# Patient Record
Sex: Male | Born: 1970
Health system: Southern US, Community
[De-identification: ages and names within clinical notes are randomized; demographics above are authoritative.]

## PROBLEM LIST (undated history)

## (undated) DIAGNOSIS — I10 Essential (primary) hypertension: Secondary | ICD-10-CM

## (undated) DIAGNOSIS — T7840XA Allergy, unspecified, initial encounter: Secondary | ICD-10-CM

## (undated) HISTORY — DX: Essential (primary) hypertension: I10

## (undated) HISTORY — DX: Allergy, unspecified, initial encounter: T78.40XA

## (undated) HISTORY — PX: HAND SURGERY: SHX662

---

## 2007-12-29 ENCOUNTER — Emergency Department (HOSPITAL_COMMUNITY): Admission: EM | Admit: 2007-12-29 | Discharge: 2007-12-29 | Payer: Self-pay | Admitting: Emergency Medicine

## 2011-09-13 DIAGNOSIS — Z0271 Encounter for disability determination: Secondary | ICD-10-CM

## 2012-01-25 ENCOUNTER — Ambulatory Visit (INDEPENDENT_AMBULATORY_CARE_PROVIDER_SITE_OTHER): Payer: BC Managed Care – PPO | Admitting: Family Medicine

## 2012-01-25 VITALS — BP 164/118 | HR 87 | Temp 98.2°F | Resp 18 | Wt 260.0 lb

## 2012-01-25 DIAGNOSIS — R5381 Other malaise: Secondary | ICD-10-CM

## 2012-01-25 DIAGNOSIS — I10 Essential (primary) hypertension: Secondary | ICD-10-CM

## 2012-01-25 DIAGNOSIS — R5383 Other fatigue: Secondary | ICD-10-CM

## 2012-01-25 DIAGNOSIS — E669 Obesity, unspecified: Secondary | ICD-10-CM

## 2012-01-25 LAB — COMPREHENSIVE METABOLIC PANEL
ALT: 47 U/L (ref 0–53)
AST: 28 U/L (ref 0–37)
Albumin: 5 g/dL (ref 3.5–5.2)
Alkaline Phosphatase: 65 U/L (ref 39–117)
BUN: 12 mg/dL (ref 6–23)
CO2: 25 mEq/L (ref 19–32)
Calcium: 10.1 mg/dL (ref 8.4–10.5)
Chloride: 104 mEq/L (ref 96–112)
Creat: 1.05 mg/dL (ref 0.50–1.35)
Glucose, Bld: 91 mg/dL (ref 70–99)
Potassium: 4.7 mEq/L (ref 3.5–5.3)
Sodium: 140 mEq/L (ref 135–145)
Total Bilirubin: 0.5 mg/dL (ref 0.3–1.2)
Total Protein: 7.7 g/dL (ref 6.0–8.3)

## 2012-01-25 LAB — POCT CBC
Granulocyte percent: 74.1 %G (ref 37–80)
HCT, POC: 52 % (ref 43.5–53.7)
Hemoglobin: 16.2 g/dL (ref 14.1–18.1)
Lymph, poc: 1.3 (ref 0.6–3.4)
MCH, POC: 29.3 pg (ref 27–31.2)
MCHC: 31.2 g/dL — AB (ref 31.8–35.4)
MCV: 94 fL (ref 80–97)
MID (cbc): 0.4 (ref 0–0.9)
MPV: 10.7 fL (ref 0–99.8)
POC Granulocyte: 4.8 (ref 2–6.9)
POC LYMPH PERCENT: 19.5 %L (ref 10–50)
POC MID %: 6.4 %M (ref 0–12)
Platelet Count, POC: 301 10*3/uL (ref 142–424)
RBC: 5.53 M/uL (ref 4.69–6.13)
RDW, POC: 13 %
WBC: 6.5 10*3/uL (ref 4.6–10.2)

## 2012-01-25 LAB — LIPID PANEL
Cholesterol: 204 mg/dL — ABNORMAL HIGH (ref 0–200)
HDL: 36 mg/dL — ABNORMAL LOW (ref >39)
LDL Cholesterol: 142 mg/dL — ABNORMAL HIGH (ref 0–99)
Total CHOL/HDL Ratio: 5.7 Ratio
Triglycerides: 129 mg/dL (ref <150)
VLDL: 26 mg/dL (ref 0–40)

## 2012-01-25 LAB — TSH: TSH: 0.547 u[IU]/mL (ref 0.350–4.500)

## 2012-01-25 LAB — GLUCOSE, POCT (MANUAL RESULT ENTRY): POC Glucose: 81 mg/dl (ref 70–99)

## 2012-01-25 MED ORDER — VERAPAMIL HCL ER 240 MG PO TBCR: 240.0000 mg | EXTENDED_RELEASE_TABLET | Freq: Every day | ORAL | Status: DC

## 2012-01-25 MED ORDER — TRAZODONE HCL 50 MG PO TABS: 25.0000 mg | ORAL_TABLET | Freq: Every evening | ORAL | Status: DC | PRN

## 2012-01-25 NOTE — Progress Notes (Signed)
Subjective:    Patient ID: Philip Conley, male    DOB: Dec 27, 1970, 41 y.o.   MRN: [id number]  HPI  Had wellness check at work and BP was about as high as it was today.   Noticing he is very fatigued - will fall asleep anytime during the day if he is just sitting. Works 3rd shift - but does not sleep well during the day.  Falls asleep but up again in 3-4hrs, no matter how long he lays, cannot go back to sleep. Going on for the past 6 mos but has been working nights for 4 yrs - doesn't know what has changed.  Sleep is better on the wkends when he can sleep at night but still does not feel well rested when he awakes in the a.m. and will often awaken spontaneously around 5 or 6 hrs if sleeping at night. Comes out like someone slapped him. Allergies worse, snores but has been better recently - not sure why, no reported apnea episodes but wakes fatigued.  Has been tyring to avoid salt and caffeine.  Drinking 1 1/2 to 2 gallons H20/day - always has. No polyuria, slight increased urinary freq, decreased meal freq - very sporadic.  Weight fluctuates.  Last ate around 5 pm but didn't have to work last night - so slept from 11:30 - 5 last night. Is fasting.   Was put on lisinopril 10 sev yrs ago from our clinic when BP was in 130s - doesn't remember what med but had more migraines and so it was decreased to 5 mg but then was stopped as bp was not lowered at all by this low dose.  H/o severe migraines which were made worse by the BP meds.  Has had migraines since a child and has had extensive neurologic w/u done as they will cause severe vision changes, facial asymetry, weakness.  Has not used or ever tried any sleeping meds - in fact, no meds at all other than occ reg ibuprofen/tylenol for MSK pain.  No past medical history on file.   Review of Systems  Constitutional: Negative for fever, activity change, appetite change and unexpected weight change.  Eyes: Negative for visual disturbance.  Respiratory:  Negative for apnea, chest tightness and shortness of breath.   Cardiovascular: Positive for leg swelling. Negative for chest pain.  Genitourinary: Positive for frequency. Negative for urgency and enuresis.  Neurological: Positive for headaches. Negative for dizziness, syncope, facial asymmetry, weakness and light-headedness.  Psychiatric/Behavioral: Positive for sleep disturbance. Negative for dysphoric mood and decreased concentration. The patient is not nervous/anxious.       BP 164/118  Pulse 87  Temp 98.2 F (36.8 C) (Oral)  Resp 18  Wt 260 lb (117.935 kg) Objective:   Physical Exam  Constitutional: He is oriented to person, place, and time. He appears well-developed and well-nourished. No distress.  HENT:  Head: Normocephalic and atraumatic.  Eyes: Conjunctivae normal are normal. Pupils are equal, round, and reactive to light. No scleral icterus.  Neck: Normal range of motion. Neck supple. No thyromegaly present.  Cardiovascular: Normal rate, regular rhythm, normal heart sounds and intact distal pulses.   Pulmonary/Chest: Effort normal and breath sounds normal. No respiratory distress.  Musculoskeletal: He exhibits no edema.  Lymphadenopathy:    He has no cervical adenopathy.  Neurological: He is alert and oriented to person, place, and time.  Skin: Skin is warm and dry. He is not diaphoretic.  Psychiatric: He has a normal mood and affect. His  behavior is normal.      Results for orders placed in visit on 01/25/12  POCT CBC      Component Value Range   WBC 6.5  4.6 - 10.2 K/uL   Lymph, poc 1.3  0.6 - 3.4   POC LYMPH PERCENT 19.5  10 - 50 %L   MID (cbc) 0.4  0 - 0.9   POC MID % 6.4  0 - 12 %M   POC Granulocyte 4.8  2 - 6.9   Granulocyte percent 74.1  37 - 80 %G   RBC 5.53  4.69 - 6.13 M/uL   Hemoglobin 16.2  14.1 - 18.1 g/dL   HCT, POC 16.1  09.6 - 53.7 %   MCV 94.0  80 - 97 fL   MCH, POC 29.3  27 - 31.2 pg   MCHC 31.2 (*) 31.8 - 35.4 g/dL   RDW, POC 04.5      Platelet Count, POC 301  142 - 424 K/uL   MPV 10.7  0 - 99.8 fL  GLUCOSE, POCT (MANUAL RESULT ENTRY)      Component Value Range   POC Glucose 81  70 - 99 mg/dl  TSH      Component Value Range   TSH 0.547  0.350 - 4.500 uIU/mL  COMPREHENSIVE METABOLIC PANEL      Component Value Range   Sodium 140  135 - 145 mEq/L   Potassium 4.7  3.5 - 5.3 mEq/L   Chloride 104  96 - 112 mEq/L   CO2 25  19 - 32 mEq/L   Glucose, Bld 91  70 - 99 mg/dL   BUN 12  6 - 23 mg/dL   Creat 4.09  8.11 - 9.14 mg/dL   Total Bilirubin 0.5  0.3 - 1.2 mg/dL   Alkaline Phosphatase 65  39 - 117 U/L   AST 28  0 - 37 U/L   ALT 47  0 - 53 U/L   Total Protein 7.7  6.0 - 8.3 g/dL   Albumin 5.0  3.5 - 5.2 g/dL   Calcium 78.2  8.4 - 95.6 mg/dL  LIPID PANEL      Component Value Range   Cholesterol 204 (*) 0 - 200 mg/dL   Triglycerides 213  <086 mg/dL   HDL 36 (*) >57 mg/dL   Total CHOL/HDL Ratio 5.7     VLDL 26  0 - 40 mg/dL   LDL Cholesterol 846 (*) 0 - 99 mg/dL      Epworth sleepiness scale of 5. Assessment & Plan:   1. Hypertension - new diagnosis. Discussed treatment options - as pt has a h/o migraines, will start bp treatment w/ verapamil in hopes that it will decrease migraines further and not exac as the lisinopril 10 did. POCT CBC, POCT glucose (manual entry), TSH, Comprehensive metabolic panel, Lipid panel  2. Obesity  POCT CBC, POCT glucose (manual entry), TSH, Comprehensive metabolic panel, Lipid panel  3. Fatigue - sig concern for sleep apnea though Epworth scale is low and certainly 3rd shift is adequate cause for fatigue. Will try trx w/ qhs prn trazodone but if daytime fatigue cont, cons sleep study POCT CBC, POCT glucose (manual entry), TSH, Comprehensive metabolic panel, Lipid panel

## 2012-02-21 ENCOUNTER — Other Ambulatory Visit: Payer: Self-pay | Admitting: Family Medicine

## 2012-02-25 ENCOUNTER — Ambulatory Visit (INDEPENDENT_AMBULATORY_CARE_PROVIDER_SITE_OTHER): Payer: BC Managed Care – PPO | Admitting: Family Medicine

## 2012-02-25 VITALS — BP 144/80 | HR 75 | Temp 98.1°F | Resp 16 | Ht 65.0 in | Wt 259.2 lb

## 2012-02-25 DIAGNOSIS — I10 Essential (primary) hypertension: Secondary | ICD-10-CM | POA: Insufficient documentation

## 2012-02-25 DIAGNOSIS — G4700 Insomnia, unspecified: Secondary | ICD-10-CM | POA: Insufficient documentation

## 2012-02-25 MED ORDER — ZOLPIDEM TARTRATE 10 MG PO TABS: 5.0000 mg | ORAL_TABLET | Freq: Every evening | ORAL | Status: DC | PRN

## 2012-02-25 MED ORDER — VERAPAMIL HCL ER 360 MG PO CP24: 360.0000 mg | ORAL_CAPSULE | Freq: Every day | ORAL | Status: DC

## 2012-03-31 ENCOUNTER — Ambulatory Visit (INDEPENDENT_AMBULATORY_CARE_PROVIDER_SITE_OTHER): Payer: BC Managed Care – PPO | Admitting: Family Medicine

## 2012-03-31 ENCOUNTER — Encounter: Payer: Self-pay | Admitting: Family Medicine

## 2012-03-31 VITALS — BP 138/88 | HR 65 | Temp 98.5°F | Resp 16 | Ht 65.0 in | Wt 261.5 lb

## 2012-03-31 DIAGNOSIS — Z79899 Other long term (current) drug therapy: Secondary | ICD-10-CM

## 2012-03-31 DIAGNOSIS — E785 Hyperlipidemia, unspecified: Secondary | ICD-10-CM | POA: Insufficient documentation

## 2012-03-31 DIAGNOSIS — I10 Essential (primary) hypertension: Secondary | ICD-10-CM

## 2012-03-31 DIAGNOSIS — Z5181 Encounter for therapeutic drug level monitoring: Secondary | ICD-10-CM | POA: Insufficient documentation

## 2012-03-31 DIAGNOSIS — G4700 Insomnia, unspecified: Secondary | ICD-10-CM

## 2012-03-31 DIAGNOSIS — G43909 Migraine, unspecified, not intractable, without status migrainosus: Secondary | ICD-10-CM | POA: Insufficient documentation

## 2012-03-31 MED ORDER — VERAPAMIL HCL ER 240 MG PO CP24: 480.0000 mg | ORAL_CAPSULE | Freq: Every morning | ORAL | Status: DC

## 2012-03-31 NOTE — Progress Notes (Signed)
Subjective:    Patient ID: Philip Conley, male    DOB: [birth year], 42 y.o.   MRN: [id number]  HPI  When he was taking 2 tabs of the verapamil 240mg  qam for 2 wks he did absolutely great, no complaints.  However, he switched to the 1 tab of 360mg  qam 2 wks ago and since then he has been having worse HAs, bloody noses - even with sneezing, both sides bleed at the same time, severe swelling in legs.  It is helping him sleep but now sleeping to much - even when not on ambien, and it seems to make him really hungry.  Checked bp the other day and it was 135/85. Only took [place] twice. Seemed to have a counter effect as was up every 2 hrs - peeing a lot and dizziness after the medication. Taking bp meds in the morning after work before he goes to bed  Has tried a lot of 2 tylenol ES, uses excedrin migraine occ but very sinsitive to the caffiene component.  Has an extensive h/o migraine w/u and treatment and sig side effects to all rx migraine meds.  Past Medical History  Diagnosis Date  . Allergy    Current Outpatient Prescriptions on File Prior to Visit  Medication Sig Dispense Refill  . Aspirin-Acetaminophen-Caffeine (EXCEDRIN MIGRAINE PO) Take by mouth as needed.      . zolpidem (AMBIEN) 10 MG tablet Take 0.5 tablets (5 mg total) by mouth at bedtime as needed for sleep.  15 tablet  0   No Known Allergies  Review of Systems  Constitutional: Positive for appetite change and unexpected weight change. Negative for fever and chills.  HENT: Positive for nosebleeds and sneezing.   Eyes: Negative for visual disturbance.  Respiratory: Negative for shortness of breath.   Cardiovascular: Positive for leg swelling. Negative for chest pain.  Genitourinary: Positive for frequency.  Neurological: Positive for dizziness, light-headedness and headaches. Negative for syncope, facial asymmetry and weakness.  Psychiatric/Behavioral: Positive for sleep disturbance. Negative for dysphoric mood and decreased  concentration. The patient is not nervous/anxious.       BP 138/88  Pulse 65  Temp 98.5 F (36.9 C) (Oral)  Resp 16  Ht 5\' 5"  (1.651 m)  Wt 261 lb 8 oz (118.616 kg)  BMI 43.52 kg/m2  SpO2 96% Objective:   Physical Exam  Constitutional: He is oriented to person, place, and time. He appears well-developed and well-nourished. No distress.  HENT:  Head: Normocephalic and atraumatic.  Eyes: Conjunctivae normal are normal. Pupils are equal, round, and reactive to light. No scleral icterus.  Neck: Normal range of motion. Neck supple. No thyromegaly present.  Cardiovascular: Normal rate, regular rhythm, normal heart sounds and intact distal pulses.   Pulmonary/Chest: Effort normal and breath sounds normal. No respiratory distress.  Musculoskeletal: He exhibits no edema.  Lymphadenopathy:    He has no cervical adenopathy.  Neurological: He is alert and oriented to person, place, and time. No cranial nerve deficit. He exhibits normal muscle tone. Coordination normal.  Skin: Skin is warm and dry. He is not diaphoretic.  Psychiatric: He has a normal mood and affect. His behavior is normal.          Assessment & Plan:   1. Hypertension - very odd that he could not tolerate the 360 but did fine on 2 of the 240 (=480).  Could be that it was just still building up in his system vs some other filler substance in the pill. . . ?  He will switch back to the 2 of the 240 and if still having side effects we will decrease back to 1 tab of 240 - which we know he did well on, and add in another med (though has had some adverse effects w/ some prior). verapamil (VERELAN PM) 240 MG 24 hr capsule 2 tabs po qd.  Pt to check BP and call in 2 wks w/ reports and whether side effects have resolved or not.  2. Insomnia  Seems to be resolving, both trazodone and ambien hand the counter-effects so no other meds for now unless sxs worsen.  3. Hyperlipidemia LDL goal < 130  Cont lifestyle change, recheck in 3-4 mos  at next f/u  4. Migraine headache  Cont otc nsaids since h/o adverse effects to rx abortive treatments.  5. Encounter for long-term (current) use of other medications

## 2012-03-31 NOTE — Patient Instructions (Addendum)
Fat and Cholesterol Control Diet Cholesterol levels in your body are determined significantly by your diet. Cholesterol levels may also be related to heart disease. The following material helps to explain this relationship and discusses what you can do to help keep your heart healthy. Not all cholesterol is bad. Low-density lipoprotein (LDL) cholesterol is the "bad" cholesterol. It may cause fatty deposits to build up inside your arteries. High-density lipoprotein (HDL) cholesterol is "good." It helps to remove the "bad" LDL cholesterol from your blood. Cholesterol is a very important risk factor for heart disease. Other risk factors are high blood pressure, smoking, stress, heredity, and weight. The heart muscle gets its supply of blood through the coronary arteries. If your LDL cholesterol is high and your HDL cholesterol is low, you are at risk for having fatty deposits build up in your coronary arteries. This leaves less room through which blood can flow. Without sufficient blood and oxygen, the heart muscle cannot function properly and you may feel chest pains (angina pectoris). When a coronary artery closes up entirely, a part of the heart muscle may die causing a heart attack (myocardial infarction). CHECKING CHOLESTEROL When your caregiver sends your blood to a lab to be examined for cholesterol, a complete lipid (fat) profile may be done. With this test, the total amount of cholesterol and levels of LDL and HDL are determined. Triglycerides are a type of fat that circulates in the blood. They can also be used to determine heart disease risk. The list below describes what the numbers should be: Test: Total Cholesterol.  Less than 200 mg/dl. Test: LDL "bad cholesterol."  Less than 100 mg/dl.  Less than 70 mg/dl if you are at very high risk of a heart attack or sudden cardiac death. Test: HDL "good cholesterol."  Greater than 50 mg/dl for women.  Greater than 40 mg/dl for men. Test:  Triglycerides.  Less than 150 mg/dl. CONTROLLING CHOLESTEROL WITH DIET Although exercise and lifestyle factors are important, your diet is key. That is because certain foods are known to raise cholesterol and others to lower it. The goal is to balance foods for their effect on cholesterol and more importantly, to replace saturated and trans fat with other types of fat, such as monounsaturated fat, polyunsaturated fat, and omega-3 fatty acids. On average, a person should consume no more than 15 to 17 g of saturated fat daily. Saturated and trans fats are considered "bad" fats, and they will raise LDL cholesterol. Saturated fats are primarily found in animal products such as meats, butter, and cream. However, that does not mean you need to give up all your favorite foods. Today, there are good tasting, low-fat, low-cholesterol substitutes for most of the things you like to eat. Choose low-fat or nonfat alternatives. Choose round or loin cuts of red meat. These types of cuts are lowest in fat and cholesterol. Chicken (without the skin), fish, veal, and ground [place] breast are great choices. Eliminate fatty meats, such as hot dogs and salami. Even shellfish have little or no saturated fat. Have a 3 oz (85 g) portion when you eat lean meat, poultry, or fish. Trans fats are also called "partially hydrogenated oils." They are oils that have been scientifically manipulated so that they are solid at room temperature resulting in a longer shelf life and improved taste and texture of foods in which they are added. Trans fats are found in stick margarine, some tub margarines, cookies, crackers, and baked goods.  When baking and cooking, oils  are a great substitute for butter. The monounsaturated oils are especially beneficial since it is believed they lower LDL and raise HDL. The oils you should avoid entirely are saturated tropical oils, such as coconut and palm.  Remember to eat a lot from food groups that are  naturally free of saturated and trans fat, including fish, fruit, vegetables, beans, grains (barley, rice, couscous, bulgur wheat), and pasta (without cream sauces).  IDENTIFYING FOODS THAT LOWER CHOLESTEROL  Soluble fiber may lower your cholesterol. This type of fiber is found in fruits such as apples, vegetables such as broccoli, potatoes, and carrots, legumes such as beans, peas, and lentils, and grains such as barley. Foods fortified with plant sterols (phytosterol) may also lower cholesterol. You should eat at least 2 g per day of these foods for a cholesterol lowering effect.  Read package labels to identify low-saturated fats, trans fat free, and low-fat foods at the supermarket. Select cheeses that have only 2 to 3 g saturated fat per ounce. Use a heart-healthy tub margarine that is free of trans fats or partially hydrogenated oil. When buying baked goods (cookies, crackers), avoid partially hydrogenated oils. Breads and muffins should be made from whole grains (whole-wheat or whole oat flour, instead of "flour" or "enriched flour"). Buy non-creamy canned soups with reduced salt and no added fats.  FOOD PREPARATION TECHNIQUES  Never deep-fry. If you must fry, either stir-fry, which uses very little fat, or use non-stick cooking sprays. When possible, broil, bake, or roast meats, and steam vegetables. Instead of putting butter or margarine on vegetables, use lemon and herbs, applesauce, and cinnamon (for squash and sweet potatoes), nonfat yogurt, salsa, and low-fat dressings for salads.  LOW-SATURATED FAT / LOW-FAT FOOD SUBSTITUTES Meats / Saturated Fat (g)  Avoid: Steak, marbled (3 oz/85 g) / 11 g  Choose: Steak, lean (3 oz/85 g) / 4 g  Avoid: Hamburger (3 oz/85 g) / 7 g  Choose: Hamburger, lean (3 oz/85 g) / 5 g  Avoid: Ham (3 oz/85 g) / 6 g  Choose: Ham, lean cut (3 oz/85 g) / 2.4 g  Avoid: Chicken, with skin, dark meat (3 oz/85 g) / 4 g  Choose: Chicken, skin removed, dark meat (3  oz/85 g) / 2 g  Avoid: Chicken, with skin, light meat (3 oz/85 g) / 2.5 g  Choose: Chicken, skin removed, light meat (3 oz/85 g) / 1 g Dairy / Saturated Fat (g)  Avoid: Whole milk (1 cup) / 5 g  Choose: Low-fat milk, 2% (1 cup) / 3 g  Choose: Low-fat milk, 1% (1 cup) / 1.5 g  Choose: Skim milk (1 cup) / 0.3 g  Avoid: Hard cheese (1 oz/28 g) / 6 g  Choose: Skim milk cheese (1 oz/28 g) / 2 to 3 g  Avoid: Cottage cheese, 4% fat (1 cup) / 6.5 g  Choose: Low-fat cottage cheese, 1% fat (1 cup) / 1.5 g  Avoid: Ice cream (1 cup) / 9 g  Choose: Sherbet (1 cup) / 2.5 g  Choose: Nonfat frozen yogurt (1 cup) / 0.3 g  Choose: Frozen fruit bar / trace  Avoid: Whipped cream (1 tbs) / 3.5 g  Choose: Nondairy whipped topping (1 tbs) / 1 g Condiments / Saturated Fat (g)  Avoid: Mayonnaise (1 tbs) / 2 g  Choose: Low-fat mayonnaise (1 tbs) / 1 g  Avoid: Butter (1 tbs) / 7 g  Choose: Extra light margarine (1 tbs) / 1 g  Avoid: Coconut oil (1   tbs) / 11.8 g  Choose: Olive oil (1 tbs) / 1.8 g  Choose: Corn oil (1 tbs) / 1.7 g  Choose: Safflower oil (1 tbs) / 1.2 g  Choose: Sunflower oil (1 tbs) / 1.4 g  Choose: Soybean oil (1 tbs) / 2.4 g  Choose: Canola oil (1 tbs) / 1 g Document Released: 03/01/2005 Document Revised: 05/24/2011 Document Reviewed: 08/20/2010 [facility] Patient Information 2013 [place], [place]. DASH Diet The DASH diet stands for "Dietary Approaches to Stop Hypertension." It is a healthy eating plan that has been shown to reduce high blood pressure (hypertension) in as little as 14 days, while also possibly providing other significant health benefits. These other health benefits include reducing the risk of breast cancer after menopause and reducing the risk of type 2 diabetes, heart disease, colon cancer, and stroke. Health benefits also include weight loss and slowing kidney failure in patients with chronic kidney disease.  DIET GUIDELINES  Limit salt (sodium).  Your diet should contain less than 1500 mg of sodium daily.  Limit refined or processed carbohydrates. Your diet should include mostly whole grains. Desserts and added sugars should be used sparingly.  Include small amounts of heart-healthy fats. These types of fats include nuts, oils, and tub margarine. Limit saturated and trans fats. These fats have been shown to be harmful in the body. CHOOSING FOODS  The following food groups are based on a 2000 calorie diet. See your Registered Dietitian for individual calorie needs. Grains and Grain Products (6 to 8 servings daily)  Eat More Often: Whole-wheat bread, brown rice, whole-grain or wheat pasta, quinoa, popcorn without added fat or salt (air popped).  Eat Less Often: White bread, white pasta, white rice, cornbread. Vegetables (4 to 5 servings daily)  Eat More Often: Fresh, frozen, and canned vegetables. Vegetables may be raw, steamed, roasted, or grilled with a minimal amount of fat.  Eat Less Often/Avoid: Creamed or fried vegetables. Vegetables in a cheese sauce. Fruit (4 to 5 servings daily)  Eat More Often: All fresh, canned (in natural juice), or frozen fruits. Dried fruits without added sugar. One hundred percent fruit juice ( cup [237 mL] daily).  Eat Less Often: Dried fruits with added sugar. Canned fruit in light or heavy syrup. [employer], Fish, and Poultry (2 servings or less daily. One serving is 3 to 4 oz [85-114 g]).  Eat More Often: Ninety percent or leaner ground beef, tenderloin, sirloin. Round cuts of beef, chicken breast, [place] breast. All fish. Grill, bake, or broil your meat. Nothing should be fried.  Eat Less Often/Avoid: Fatty cuts of meat, [place], or chicken leg, thigh, or wing. Fried cuts of meat or fish. Dairy (2 to 3 servings)  Eat More Often: Low-fat or fat-free milk, low-fat plain or light yogurt, reduced-fat or part-skim cheese.  Eat Less Often/Avoid: Milk (whole, 2%).Whole milk yogurt. Full-fat  cheeses. Nuts, Seeds, and Legumes (4 to 5 servings per week)  Eat More Often: All without added salt.  Eat Less Often/Avoid: Salted nuts and seeds, canned beans with added salt. Fats and Sweets (limited)  Eat More Often: Vegetable oils, tub margarines without trans fats, sugar-free gelatin. Mayonnaise and salad dressings.  Eat Less Often/Avoid: Coconut oils, palm oils, butter, stick margarine, cream, half and half, cookies, candy, pie. FOR MORE INFORMATION The Dash Diet Eating Plan: www.dashdiet.org Document Released: 02/18/2011 Document Revised: 05/24/2011 Document Reviewed: 02/18/2011 [facility] Patient Information 2013 [place], [place].

## 2012-04-02 NOTE — Progress Notes (Signed)
  Subjective:    Patient ID: Philip Conley, male    DOB: 08-09-1970, 42 y.o.   MRN: [id number] Chief Complaint  Patient presents with  . Hypertension    RECHECK    HPI   Philip Conley is a delightful 42 yo here to f/u on his HTN.  At last visit, we started him on verapamil 240mg  as he has a h/o SEVERE migraines, which had been induced by some antihypertensives in the past.  He has done very well on this, no side effcts, not checking BP outside of office.  Works 3rd shift and over the past several mos has developed trouble sleeping during the day.   Has never used any sleep med prior.  We tried trazodone which didn't help at all - maybe even had an adverse effects of seemed to make his sleep worse. Even tried 2 tabs w/o relief.  Past Medical History  Diagnosis Date  . Allergy    Current Outpatient Prescriptions on File Prior to Visit  Medication Sig Dispense Refill  . zolpidem (AMBIEN) 10 MG tablet Take 0.5 tablets (5 mg total) by mouth at bedtime as needed for sleep.  15 tablet  0   No Known Allergies   Review of Systems  Constitutional: Negative for fever and chills.  Eyes: Negative for visual disturbance.  Respiratory: Negative for shortness of breath.   Cardiovascular: Negative for chest pain and leg swelling.  Neurological: Negative for dizziness, syncope, facial asymmetry, weakness, light-headedness and headaches.  Psychiatric/Behavioral: Positive for sleep disturbance. Negative for confusion, dysphoric mood, decreased concentration and agitation. The patient is not nervous/anxious.       BP 144/80  Pulse 75  Temp 98.1 F (36.7 C) (Oral)  Resp 16  Ht 5\' 5"  (1.651 m)  Wt 259 lb 3.2 oz (117.572 kg)  BMI 43.13 kg/m2  SpO2 98% Objective:   Physical Exam  Constitutional: He is oriented to person, place, and time. He appears well-developed and well-nourished. No distress.  HENT:  Head: Normocephalic and atraumatic.  Eyes: Conjunctivae normal are normal. Pupils are equal,  round, and reactive to light. No scleral icterus.  Neck: Normal range of motion. Neck supple. No thyromegaly present.  Cardiovascular: Normal rate, regular rhythm, normal heart sounds and intact distal pulses.   Pulmonary/Chest: Effort normal and breath sounds normal. No respiratory distress.  Musculoskeletal: He exhibits no edema.  Lymphadenopathy:    He has no cervical adenopathy.  Neurological: He is alert and oriented to person, place, and time.  Skin: Skin is warm and dry. He is not diaphoretic.  Psychiatric: He has a normal mood and affect. His behavior is normal.          Assessment & Plan:   1. Hypertension - still not at goal but doing well on the verapamil so increase to 360 qam. He still has a lot of the 240s at home so will take 2 of those every morning till he runs out - about 2 wks verapamil (VERELAN PM) 360 MG 24 hr capsule  2. Insomnia - failed trazodone, try prn ambien for shift sleep problems zolpidem (AMBIEN) 10 MG tablet

## 2012-06-04 ENCOUNTER — Other Ambulatory Visit: Payer: Self-pay | Admitting: Family Medicine

## 2012-09-24 ENCOUNTER — Other Ambulatory Visit: Payer: Self-pay | Admitting: Physician Assistant

## 2013-01-27 ENCOUNTER — Other Ambulatory Visit: Payer: Self-pay | Admitting: Physician Assistant

## 2013-07-14 ENCOUNTER — Other Ambulatory Visit: Payer: Self-pay | Admitting: Physician Assistant

## 2013-07-17 ENCOUNTER — Telehealth: Payer: Self-pay

## 2013-07-17 MED ORDER — VERAPAMIL HCL ER 240 MG PO CP24: 480.0000 mg | ORAL_CAPSULE | Freq: Every day | ORAL | Status: DC

## 2013-07-17 NOTE — Telephone Encounter (Signed)
Sent!

## 2013-07-17 NOTE — Telephone Encounter (Signed)
PT STATES HE ISN'T ABLE TO COME IN UNTIL Saturday BUT IS OUT OF HIS VERAPAMIL 240MG S AND NEED TO HAVE ENOUGH CALLED IN UNTIL THEN PLEASE CALL PT AT [place]

## 2013-07-19 ENCOUNTER — Telehealth: Payer: Self-pay

## 2013-07-19 NOTE — Telephone Encounter (Signed)
Patient called requesting a refill Verapamil 240 mg. [facility] in [place]. Patient stated he has been out of his medication since Tuesday. [phone]

## 2013-07-19 NOTE — Telephone Encounter (Signed)
RF was sent on 07/17/13 when pt called then, but I don't see that pt was notified that it was done. Called pt and advised him.

## 2014-05-08 ENCOUNTER — Ambulatory Visit (INDEPENDENT_AMBULATORY_CARE_PROVIDER_SITE_OTHER): Payer: BLUE CROSS/BLUE SHIELD | Admitting: Family Medicine

## 2014-05-08 VITALS — BP 162/96 | HR 77 | Temp 98.9°F | Resp 16 | Ht 66.0 in | Wt 267.8 lb

## 2014-05-08 DIAGNOSIS — I10 Essential (primary) hypertension: Secondary | ICD-10-CM

## 2014-05-08 DIAGNOSIS — R740 Nonspecific elevation of levels of transaminase and lactic acid dehydrogenase [LDH]: Secondary | ICD-10-CM

## 2014-05-08 DIAGNOSIS — Z0000 Encounter for general adult medical examination without abnormal findings: Secondary | ICD-10-CM

## 2014-05-08 DIAGNOSIS — R7401 Elevation of levels of liver transaminase levels: Secondary | ICD-10-CM

## 2014-05-08 DIAGNOSIS — I739 Peripheral vascular disease, unspecified: Secondary | ICD-10-CM

## 2014-05-08 DIAGNOSIS — G4700 Insomnia, unspecified: Secondary | ICD-10-CM

## 2014-05-08 DIAGNOSIS — Z7189 Other specified counseling: Secondary | ICD-10-CM

## 2014-05-08 DIAGNOSIS — Z7184 Encounter for health counseling related to travel: Secondary | ICD-10-CM

## 2014-05-08 LAB — POCT URINALYSIS DIPSTICK
Bilirubin, UA: NEGATIVE
Blood, UA: NEGATIVE
Glucose, UA: NEGATIVE
Ketones, UA: NEGATIVE
Leukocytes, UA: NEGATIVE
Nitrite, UA: NEGATIVE
Protein, UA: NEGATIVE
Spec Grav, UA: 1.015
Urobilinogen, UA: 0.2
pH, UA: 6.5

## 2014-05-08 MED ORDER — VERAPAMIL HCL ER 240 MG PO CP24: 480.0000 mg | ORAL_CAPSULE | Freq: Every day | ORAL | Status: DC

## 2014-05-08 MED ORDER — HYDROCHLOROTHIAZIDE 25 MG PO TABS: 25.0000 mg | ORAL_TABLET | Freq: Every day | ORAL | Status: DC | PRN

## 2014-05-08 NOTE — Patient Instructions (Addendum)
Start taking the hctz to get your swelling to go down - do along with a high potassium diet and plenty of water.  I highly recommend that you buy medium grade compression socks - check out [employer] on amazon 15-20mmHg.  Your EKG looks great.  At your next visit, please come fasting so that we can recheck your cholesterol.  I would recommend on starting on fish oil supplements to help keep your good cholesterol (HDL) higher.    You should make an appointment with the [facility] travel vaccination clinic asap.  Potassium Content of Foods Potassium is a mineral found in many foods and drinks. It helps keep fluids and minerals balanced in your body and affects how steadily your heart beats. Potassium also helps control your blood pressure and keep your muscles and nervous system healthy. Certain health conditions and medicines may change the balance of potassium in your body. When this happens, you can help balance your level of potassium through the foods that you do or do not eat. Your health care provider or dietitian may recommend an amount of potassium that you should have each day. The following lists of foods provide the amount of potassium (in parentheses) per serving in each item. HIGH IN POTASSIUM  The following foods and beverages have 200 mg or more of potassium per serving: 1. Apricots, 2 raw or 5 dry (200 mg). 2. Artichoke, 1 medium (345 mg). 3. Avocado, raw,  each (245 mg). 4. Banana, 1 medium (425 mg). 5. Beans, lima, or baked beans, canned,  cup (280 mg). 6. Beans, white, canned,  cup (595 mg). 7. Beef roast, 3 oz (320 mg). 8. Beef, ground, 3 oz (270 mg). 9. Beets, raw or cooked,  cup (260 mg). 10. Bran muffin, 2 oz (300 mg). 11. Broccoli,  cup (230 mg). 12. Brussels sprouts,  cup (250 mg). 13. Cantaloupe,  cup (215 mg). 14. Cereal, 100% bran,  cup (200-400 mg). 15. Cheeseburger, single, fast food, 1 each (225-400 mg). 16. Chicken, 3 oz (220  mg). [place], canned, 3 oz (535 mg). 18. Crab, 3 oz (225 mg). 19. Dates, 5 each (270 mg). 20. Dried beans and peas,  cup (300-475 mg). 21. Figs, dried, 2 each (260 mg). 22. Fish: halibut, tuna, cod, snapper, 3 oz (480 mg). 23. Fish: salmon, haddock, swordfish, perch, 3 oz (300 mg). 24. Fish, tuna, canned 3 oz (200 mg). 25. French fries, fast food, 3 oz (470 mg). 26. Granola with fruit and nuts,  cup (200 mg). 27. Grapefruit juice,  cup (200 mg). 28. Greens, beet,  cup (655 mg). 29. Honeydew melon,  cup (200 mg). [place], raw, 1 cup (300 mg). 31. Kiwi, 1 medium (240 mg). 32. Kohlrabi, rutabaga, parsnips,  cup (280 mg). 33. Lentils,  cup (365 mg). 34. Mango, 1 each (325 mg). 35. Milk, chocolate, 1 cup (420 mg). 36. Milk: nonfat, low-fat, whole, buttermilk, 1 cup (350-380 mg). 37. Molasses, 1 Tbsp (295 mg). 38. Mushrooms,  cup (280) mg. 39. Nectarine, 1 each (275 mg). 40. Nuts: almonds, peanuts, hazelnuts, [place], cashew, mixed, 1 oz (200 mg). 41. Nuts, pistachios, 1 oz (295 mg). 42. Orange, 1 each (240 mg). 43. Orange juice,  cup (235 mg). 44. Papaya, medium,  fruit (390 mg). 45. Peanut butter, chunky, 2 Tbsp (240 mg). 46. Peanut butter, smooth, 2 Tbsp (210 mg). 47. Pear, 1 medium (200 mg). 48. Pomegranate, 1 whole (400 mg). 49. Pomegranate juice,  cup (215 mg). 50. Pork, 3 oz (  350 mg). 51. Potato chips, salted, 1 oz (465 mg). 52. Potato, baked with skin, 1 medium (925 mg). 53. Potatoes, boiled,  cup (255 mg). 54. Potatoes, mashed,  cup (330 mg). 55. Prune juice,  cup (370 mg). 56. Prunes, 5 each (305 mg). 57. Pudding, chocolate,  cup (230 mg). 58. Pumpkin, canned,  cup (250 mg). 59. Raisins, seedless,  cup (270 mg). 60. Seeds, sunflower or pumpkin, 1 oz (240 mg). 61. Soy milk, 1 cup (300 mg). 62. Spinach,  cup (420 mg). 63. Spinach, canned,  cup (370 mg). 64. Sweet potato, baked with skin, 1 medium (450 mg). 65. Swiss chard,  cup (480  mg). 66. Tomato or vegetable juice,  cup (275 mg). 67. Tomato sauce or puree,  cup (400-550 mg). 68. Tomato, raw, 1 medium (290 mg). 69. Tomatoes, canned,  cup (200-300 mg). 31. [place], 3 oz (250 mg). 71. Wheat germ, 1 oz (250 mg). 72. Winter squash,  cup (250 mg). 73. Yogurt, plain or fruited, 6 oz (260-435 mg). 74. Zucchini,  cup (220 mg). MODERATE IN POTASSIUM The following foods and beverages have 50-200 mg of potassium per serving: 1. Apple, 1 each (150 mg). 2. Apple juice,  cup (150 mg). 3. Applesauce,  cup (90 mg). 4. Apricot nectar,  cup (140 mg). 5. Asparagus, small spears,  cup or 6 spears (155 mg). 6. Bagel, cinnamon raisin, 1 each (130 mg). 7. Bagel, egg or plain, 4 in., 1 each (70 mg). 8. Beans, green,  cup (90 mg). 9. Beans, yellow,  cup (190 mg). 10. Beer, regular, 12 oz (100 mg). 11. Beets, canned,  cup (125 mg). 12. Blackberries,  cup (115 mg). 13. Blueberries,  cup (60 mg). 14. Bread, whole wheat, 1 slice (70 mg). 15. Broccoli, raw,  cup (145 mg). 16. Cabbage,  cup (150 mg). 17. Carrots, cooked or raw,  cup (180 mg). 18. Cauliflower, raw,  cup (150 mg). 19. Celery, raw,  cup (155 mg). 20. Cereal, bran flakes, cup (120-150 mg). 21. Cheese, cottage,  cup (110 mg). 22. Cherries, 10 each (150 mg). 23. Chocolate, 1 oz bar (165 mg). 24. Coffee, brewed 6 oz (90 mg). 25. Corn,  cup or 1 ear (195 mg). 26. Cucumbers,  cup (80 mg). 27. Egg, large, 1 each (60 mg). 28. Eggplant,  cup (60 mg). 29. Endive, raw, cup (80 mg). 30. English muffin, 1 each (65 mg). 31. Fish, orange roughy, 3 oz (150 mg). 32. Frankfurter, beef or pork, 1 each (75 mg). 33. Fruit cocktail,  cup (115 mg). 34. Grape juice,  cup (170 mg). 35. Grapefruit,  fruit (175 mg). 36. Grapes,  cup (155 mg). 37. Greens: kale, turnip, collard,  cup (110-150 mg). 38. Ice cream or frozen yogurt, chocolate,  cup (175 mg). 39. Ice cream or frozen yogurt, vanilla,  cup  (120-150 mg). 40. Lemons, limes, 1 each (80 mg). 41. Lettuce, all types, 1 cup (100 mg). 42. Mixed vegetables,  cup (150 mg). 43. Mushrooms, raw,  cup (110 mg). 44. Nuts: walnuts, pecans, or macadamia, 1 oz (125 mg). 45. Oatmeal,  cup (80 mg). 46. Okra,  cup (110 mg). 47. Onions, raw,  cup (120 mg). 48. Peach, 1 each (185 mg). 49. Peaches, canned,  cup (120 mg). 50. Pears, canned,  cup (120 mg). 51. Peas, green, frozen,  cup (90 mg). 52. Peppers, green,  cup (130 mg). 53. Peppers, red,  cup (160 mg). 54. Pineapple juice,  cup (165 mg). 55. Pineapple, fresh or canned,  cup (  100 mg). 56. Plums, 1 each (105 mg). 57. Pudding, vanilla,  cup (150 mg). 58. Raspberries,  cup (90 mg). 59. Rhubarb,  cup (115 mg). 60. Rice, wild,  cup (80 mg). 61. Shrimp, 3 oz (155 mg). 62. Spinach, raw, 1 cup (170 mg). 63. Strawberries,  cup (125 mg). 64. Summer squash  cup (175-200 mg). 65. Swiss chard, raw, 1 cup (135 mg). 66. Tangerines, 1 each (140 mg). 67. Tea, brewed, 6 oz (65 mg). 68. Turnips,  cup (140 mg). 69. Watermelon,  cup (85 mg). 70. Wine, red, table, 5 oz (180 mg). 71. Wine, white, table, 5 oz (100 mg). LOW IN POTASSIUM The following foods and beverages have less than 50 mg of potassium per serving. 1. Bread, white, 1 slice (30 mg). 2. Carbonated beverages, 12 oz (less than 5 mg). 3. Cheese, 1 oz (20-30 mg). 4. Cranberries,  cup (45 mg). 5. Cranberry juice cocktail,  cup (20 mg). 6. Fats and oils, 1 Tbsp (less than 5 mg). 7. Hummus, 1 Tbsp (32 mg). 8. Nectar: papaya, mango, or pear,  cup (35 mg). 9. Rice, white or brown,  cup (50 mg). 10. Spaghetti or macaroni,  cup cooked (30 mg). 11. Tortilla, flour or corn, 1 each (50 mg). 12. Waffle, 4 in., 1 each (50 mg). 13. Water chestnuts,  cup (40 mg). Document Released: 10/13/2004 Document Revised: 03/06/2013 Document Reviewed: 01/26/2013 [facility] Patient Information 2015 [place], [place]. This information is  not intended to replace advice given to you by your health care provider. Make sure you discuss any questions you have with your health care provider.    DASH Eating Plan DASH stands for "Dietary Approaches to Stop Hypertension." The DASH eating plan is a healthy eating plan that has been shown to reduce high blood pressure (hypertension). Additional health benefits may include reducing the risk of type 2 diabetes mellitus, heart disease, and stroke. The DASH eating plan may also help with weight loss. WHAT DO I NEED TO KNOW ABOUT THE DASH EATING PLAN? For the DASH eating plan, you will follow these general guidelines: 75. Choose foods with a percent daily value for sodium of less than 5% (as listed on the food label). 76. Use salt-free seasonings or herbs instead of table salt or sea salt. 77. Check with your health care provider or pharmacist before using salt substitutes. 78. Eat lower-sodium products, often labeled as "lower sodium" or "no salt added." 79. Eat fresh foods. 80. Eat more vegetables, fruits, and low-fat dairy products. 81. Choose whole grains. Look for the word "whole" as the first word in the ingredient list. 82. Choose fish and skinless chicken or [place] more often than red meat. Limit fish, poultry, and meat to 6 oz (170 g) each day. 83. Limit sweets, desserts, sugars, and sugary drinks. 84. Choose heart-healthy fats. 85. Limit cheese to 1 oz (28 g) per day. 86. Eat more home-cooked food and less restaurant, buffet, and fast food. 87. Limit fried foods. 88. Cook foods using methods other than frying. 89. Limit canned vegetables. If you do use them, rinse them well to decrease the sodium. 90. When eating at a restaurant, ask that your food be prepared with less salt, or no salt if possible. WHAT FOODS CAN I EAT? Seek help from a dietitian for individual calorie needs. Grains Whole grain or whole wheat bread. Brown rice. Whole grain or whole wheat pasta. Quinoa, bulgur,  and whole grain cereals. Low-sodium cereals. Corn or whole wheat flour tortillas. Whole grain cornbread.  Whole grain crackers. Low-sodium crackers. Vegetables Fresh or frozen vegetables (raw, steamed, roasted, or grilled). Low-sodium or reduced-sodium tomato and vegetable juices. Low-sodium or reduced-sodium tomato sauce and paste. Low-sodium or reduced-sodium canned vegetables.  Fruits All fresh, canned (in natural juice), or frozen fruits. Meat and Other Protein Products Ground beef (85% or leaner), grass-fed beef, or beef trimmed of fat. Skinless chicken or [place]. Ground chicken or [place]. Pork trimmed of fat. All fish and seafood. Eggs. Dried beans, peas, or lentils. Unsalted nuts and seeds. Unsalted canned beans. Dairy Low-fat dairy products, such as skim or 1% milk, 2% or reduced-fat cheeses, low-fat ricotta or cottage cheese, or plain low-fat yogurt. Low-sodium or reduced-sodium cheeses. Fats and Oils Tub margarines without trans fats. Light or reduced-fat mayonnaise and salad dressings (reduced sodium). Avocado. Safflower, olive, or canola oils. Natural peanut or almond butter. Other Unsalted popcorn and pretzels. The items listed above may not be a complete list of recommended foods or beverages. Contact your dietitian for more options. WHAT FOODS ARE NOT RECOMMENDED? Grains White bread. White pasta. White rice. Refined cornbread. Bagels and croissants. Crackers that contain trans fat. Vegetables Creamed or fried vegetables. Vegetables in a cheese sauce. Regular canned vegetables. Regular canned tomato sauce and paste. Regular tomato and vegetable juices. Fruits Dried fruits. Canned fruit in light or heavy syrup. Fruit juice. Meat and Other Protein Products Fatty cuts of meat. Ribs, chicken wings, bacon, sausage, bologna, salami, chitterlings, fatback, hot dogs, bratwurst, and packaged luncheon meats. Salted nuts and seeds. Canned beans with salt. Dairy Whole or 2% milk, cream,  half-and-half, and cream cheese. Whole-fat or sweetened yogurt. Full-fat cheeses or blue cheese. Nondairy creamers and whipped toppings. Processed cheese, cheese spreads, or cheese curds. Condiments Onion and garlic salt, seasoned salt, table salt, and sea salt. Canned and packaged gravies. Worcestershire sauce. Tartar sauce. Barbecue sauce. Teriyaki sauce. Soy sauce, including reduced sodium. Steak sauce. Fish sauce. Oyster sauce. Cocktail sauce. Horseradish. Ketchup and mustard. Meat flavorings and tenderizers. Bouillon cubes. Hot sauce. Tabasco sauce. Marinades. Taco seasonings. Relishes. Fats and Oils Butter, stick margarine, lard, shortening, ghee, and bacon fat. Coconut, palm kernel, or palm oils. Regular salad dressings. Other Pickles and olives. Salted popcorn and pretzels. The items listed above may not be a complete list of foods and beverages to avoid. Contact your dietitian for more information. WHERE CAN I FIND MORE INFORMATION? National Heart, Lung, and Blood Institute: travelstabloid.com Document Released: 02/18/2011 Document Revised: 07/16/2013 Document Reviewed: 01/03/2013 [facility] Patient Information 2015 [place], [place]. This information is not intended to replace advice given to you by your health care provider. Make sure you discuss any questions you have with your health care provider.  Managing Your High Blood Pressure Blood pressure is a measurement of how forceful your blood is pressing against the walls of the arteries. Arteries are muscular tubes within the circulatory system. Blood pressure does not stay the same. Blood pressure rises when you are active, excited, or nervous; and it lowers during sleep and relaxation. If the numbers measuring your blood pressure stay above normal most of the time, you are at risk for health problems. High blood pressure (hypertension) is a long-term (chronic) condition in which blood pressure is elevated. A  blood pressure reading is recorded as two numbers, such as 120 over 80 (or 120/80). The first, higher number is called the systolic pressure. It is a measure of the pressure in your arteries as the heart beats. The second, lower number is called the diastolic pressure. It  is a measure of the pressure in your arteries as the heart relaxes between beats.  Keeping your blood pressure in a normal range is important to your overall health and prevention of health problems, such as heart disease and stroke. When your blood pressure is uncontrolled, your heart has to work harder than normal. High blood pressure is a very common condition in adults because blood pressure tends to rise with age. Men and women are equally likely to have hypertension but at different times in life. Before age 7, men are more likely to have hypertension. After 46 years of age, women are more likely to have it. Hypertension is especially common in African Americans. This condition often has no signs or symptoms. The cause of the condition is usually not known. Your caregiver can help you come up with a plan to keep your blood pressure in a normal, healthy range. BLOOD PRESSURE STAGES Blood pressure is classified into four stages: normal, prehypertension, stage 1, and stage 2. Your blood pressure reading will be used to determine what type of treatment, if any, is necessary. Appropriate treatment options are tied to these four stages:  Normal 91. Systolic pressure (mm Hg): below 120. 92. Diastolic pressure (mm Hg): below 80. Prehypertension 5. Systolic pressure (mm Hg): 120 to 299. 73. Diastolic pressure (mm Hg): 80 to 89. Stage1 14. Systolic pressure (mm Hg): 140 to 159. 15. Diastolic pressure (mm Hg): 90 to 99. Stage2  Systolic pressure (mm Hg): 160 or above.  Diastolic pressure (mm Hg): 100 or above. RISKS RELATED TO HIGH BLOOD PRESSURE Managing your blood pressure is an important responsibility. Uncontrolled high blood  pressure can lead to:  A heart attack.  A stroke.  A weakened blood vessel (aneurysm).  Heart failure.  Kidney damage.  Eye damage.  Metabolic syndrome.  Memory and concentration problems. HOW TO MANAGE YOUR BLOOD PRESSURE Blood pressure can be managed effectively with lifestyle changes and medicines (if needed). Your caregiver will help you come up with a plan to bring your blood pressure within a normal range. Your plan should include the following: Education  Read all information provided by your caregivers about how to control blood pressure.  Educate yourself on the latest guidelines and treatment recommendations. New research is always being done to further define the risks and treatments for high blood pressure. Lifestylechanges  Control your weight.  Avoid smoking.  Stay physically active.  Reduce the amount of salt in your diet.  Reduce stress.  Control any chronic conditions, such as high cholesterol or diabetes.  Reduce your alcohol intake. Medicines  Several medicines (antihypertensive medicines) are available, if needed, to bring blood pressure within a normal range. Communication  Review all the medicines you take with your caregiver because there may be side effects or interactions.  Talk with your caregiver about your diet, exercise habits, and other lifestyle factors that may be contributing to high blood pressure.  See your caregiver regularly. Your caregiver can help you create and adjust your plan for managing high blood pressure. RECOMMENDATIONS FOR TREATMENT AND FOLLOW-UP  The following recommendations are based on current guidelines for managing high blood pressure in nonpregnant adults. Use these recommendations to identify the proper follow-up period or treatment option based on your blood pressure reading. You can discuss these options with your caregiver.  Systolic pressure of 371 to 696 or diastolic pressure of 80 to 89: Follow up with  your caregiver as directed.  Systolic pressure of 789 to 381 or diastolic  pressure of 90 to 100: Follow up with your caregiver within 2 months.  Systolic pressure above 161 or diastolic pressure above 096: Follow up with your caregiver within 1 month.  Systolic pressure above 045 or diastolic pressure above 409: Consider antihypertensive therapy; follow up with your caregiver within 1 week.  Systolic pressure above 811 or diastolic pressure above 914: Begin antihypertensive therapy; follow up with your caregiver within 1 week. Document Released: 11/24/2011 Document Reviewed: 11/24/2011 [facility] Patient Information 2015 [place]. This information is not intended to replace advice given to you by your health care provider. Make sure you discuss any questions you have with your health care provider.  Venous Stasis or Chronic Venous Insufficiency Chronic venous insufficiency, also called venous stasis, is a condition that affects the veins in the legs. The condition prevents blood from being pumped through these veins effectively. Blood may no longer be pumped effectively from the legs back to the heart. This condition can range from mild to severe. With proper treatment, you should be able to continue with an active life. CAUSES  Chronic venous insufficiency occurs when the vein walls become stretched, weakened, or damaged or when valves within the vein are damaged. Some common causes of this include: 38. High blood pressure inside the veins (venous hypertension). 94. Increased blood pressure in the leg veins from long periods of sitting or standing. 95. A blood clot that blocks blood flow in a vein (deep vein thrombosis). 96. Inflammation of a superficial vein (phlebitis) that causes a blood clot to form. RISK FACTORS Various things can make you more likely to develop chronic venous insufficiency, including: 74. Family history of this condition. 75. Obesity. 76. Pregnancy. 77. Sedentary  lifestyle. 78. Smoking. 79. Jobs requiring long periods of standing or sitting in one place. 25. Being a certain age. Women in their 35s and 20s and men in their 47s are more likely to develop this condition. SIGNS AND SYMPTOMS  Symptoms may include:  16. Varicose veins. 17. Skin breakdown or ulcers. 18. Reddened or discolored skin on the leg. 43. Brown, smooth, tight, and painful skin just above the ankle, usually on the inside surface (lipodermatosclerosis). 20. Swelling. DIAGNOSIS  To diagnose this condition, your health care provider will take a medical history and do a physical exam. The following tests may be ordered to confirm the diagnosis:  Duplex ultrasound--A procedure that produces a picture of a blood vessel and nearby organs and also provides information on blood flow through the blood vessel.  Plethysmography--A procedure that tests blood flow.  A venogram, or venography--A procedure used to look at the veins using X-ray and dye. TREATMENT The goals of treatment are to help you return to an active life and to minimize pain or disability. Treatment will depend on the severity of the condition. Medical procedures may be needed for severe cases. Treatment options may include:   Use of compression stockings. These can help with symptoms and lower the chances of the problem getting worse, but they do not cure the problem.  Sclerotherapy--A procedure involving an injection of a material that "dissolves" the damaged veins. Other veins in the network of blood vessels take over the function of the damaged veins.  Surgery to remove the vein or cut off blood flow through the vein (vein stripping or laser ablation surgery).  Surgery to repair a valve. HOME CARE INSTRUCTIONS   Wear compression stockings as directed by your health care provider.  Only take over-the-counter or prescription medicines for  pain, discomfort, or fever as directed by your health care provider.  Follow up  with your health care provider as directed. SEEK MEDICAL CARE IF:   You have redness, swelling, or increasing pain in the affected area.  You see a red streak or line that extends up or down from the affected area.  You have a breakdown or loss of skin in the affected area, even if the breakdown is small.  You have an injury to the affected area. SEEK IMMEDIATE MEDICAL CARE IF:   You have an injury and open wound in the affected area.  Your pain is severe and does not improve with medicine.  You have sudden numbness or weakness in the foot or ankle below the affected area, or you have trouble moving your foot or ankle.  You have a fever or persistent symptoms for more than 2-3 days.  You have a fever and your symptoms suddenly get worse. MAKE SURE YOU:   Understand these instructions.  Will watch your condition.  Will get help right away if you are not doing well or get worse. Document Released: 07/05/2006 Document Revised: 12/20/2012 Document Reviewed: 11/06/2012 [facility] Patient Information 2015 [place], [place]. This information is not intended to replace advice given to you by your health care provider. Make sure you discuss any questions you have with your health care provider. Keeping you healthy  Get these tests  Blood pressure- Have your blood pressure checked once a year by your healthcare provider.  Normal blood pressure is 120/80.  Weight- Have your body mass index (BMI) calculated to screen for obesity.  BMI is a measure of body fat based on height and weight. You can also calculate your own BMI at [email or web address].[email or web address].  Cholesterol- Have your cholesterol checked regularly starting at age 60, sooner may be necessary if you have diabetes, high blood pressure, if a family member developed heart diseases at an early age or if you smoke.   Chlamydia, HIV, and other sexual transmitted disease- Get screened each year until the age of 99 then within three months of  each new sexual partner.  Diabetes- Have your blood sugar checked regularly if you have high blood pressure, high cholesterol, a family history of diabetes or if you are overweight.  Get these vaccines  Flu shot- Every fall.  Tetanus shot- Every 10 years.  Menactra- Single dose; prevents meningitis.  Take these steps  Don't smoke- If you do smoke, ask your healthcare provider about quitting. For tips on how to quit, go to www.smokefree.gov or call 1-800-QUIT-NOW.  Be physically active- Exercise 5 days a week for at least 30 minutes.  If you are not already physically active start slow and gradually work up to 30 minutes of moderate physical activity.  Examples of moderate activity include walking briskly, mowing the yard, dancing, swimming bicycling, etc.  Eat a healthy diet- Eat a variety of healthy foods such as fruits, vegetables, low fat milk, low fat cheese, yogurt, lean meats, poultry, fish, beans, tofu, etc.  For more information on healthy eating, go to www.thenutritionsource.org  Drink alcohol in moderation- Limit alcohol intake two drinks or less a day.  Never drink and drive.  Dentist- Brush and floss teeth twice daily; visit your dentis twice a year.  Depression-Your emotional health is as important as your physical health.  If you're feeling down, losing interest in things you normally enjoy please talk with your healthcare provider.  Gun Safety- If you keep a gun in your home,  keep it unloaded and with the safety lock on.  Bullets should be stored separately.  Helmet use- Always wear a helmet when riding a motorcycle, bicycle, rollerblading or skateboarding.  Safe sex- If you may be exposed to a sexually transmitted infection, use a condom  Seat belts- Seat bels can save your life; always wear one.  Smoke/Carbon Monoxide detectors- These detectors need to be installed on the appropriate level of your home.  Replace batteries at least once a year.  Skin Cancer- When  out in the sun, cover up and use sunscreen SPF 15 or higher.  Violence- If anyone is threatening or hurting you, please tell your healthcare provider.

## 2014-05-08 NOTE — Progress Notes (Addendum)
Subjective:  This chart was scribed for [name], MD by Dellis Filbert, ED Scribe at [facility].The patient was seen in exam room 04 and the patient's care was started at 6:02 PM.   Patient ID: Philip Conley, male    DOB: [birth year], 44 y.o.   MRN: [id number] Chief Complaint  Patient presents with  . Annual Exam   HPI  HPI Comments: Philip Conley is a 44 y.o. male who presents to [facility] for an annual physical exam. Last had routine lab work 2 1/2 years ago. Elevated LDL, low HDL. reviewed TLC and recommended fish oil supplements. He has been off of verapamil for about a year. Blood pressure has been good until injury. He was injured at work and told he has degenerative cervical disease and has lost feeling in his left hand. Seen Dr. Patrice Paradise and given an injection then feeling returned back left hand. Now has no significant neck pain. Pt occasionally takes Excedrin, last migraine was Sunday. He has noticed lesions on the his right arm right calf and left ankle joint. 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 No urinary symptoms, he has had normal BM. No indigestion. Occasional exercise, pt rides his bike.  Patient Active Problem List   Diagnosis Date Noted  . Hyperlipidemia LDL goal < 130 03/31/2012  . Migraine headache 03/31/2012  . Encounter for long-term (current) use of other medications 03/31/2012  . Insomnia 02/25/2012  . Hypertension 02/25/2012   Past Medical History  Diagnosis Date  . Allergy    History reviewed. No pertinent past surgical history. No Known Allergies Prior to Admission medications   Medication Sig Start Date End Date Taking? Authorizing Provider  Aspirin-Acetaminophen-Caffeine (EXCEDRIN MIGRAINE PO) Take by mouth as needed.   Yes Historical Provider, MD  verapamil (VERELAN PM) 240 MG 24 hr capsule Take 2 capsules (480 mg total) by mouth daily.  PATIENT NEEDS OFFICE VISIT FOR ADDITIONAL REFILLS Patient not taking: Reported on 05/08/2014 07/17/13   Theda Sers, PA-C  zolpidem (AMBIEN) 10 MG tablet Take 0.5 tablets (5 mg total) by mouth at bedtime as needed for sleep. 02/25/12 03/26/12  Shawnee Knapp, MD   History   Social History  . Marital Status: Married    Spouse Name: N/A  . Number of Children: N/A  . Years of Education: N/A   Occupational History  . Not on file.   Social History Main Topics  . Smoking status: Never Smoker   . Smokeless tobacco: Not on file  . Alcohol Use: Not on file  . Drug Use: Not on file  . Sexual Activity: Yes    Birth Control/ Protection: None     Comment: number of sex partners in the last 15 months  1   Other Topics Concern  . Not on file   Social History Narrative   Review of Systems  Musculoskeletal: Positive for neck pain.  Allergic/Immunologic: Positive for environmental allergies.  Neurological: Positive for headaches.  All other systems reviewed and are negative.     Objective:  BP 162/96 mmHg  Pulse 77  Temp(Src) 98.9 F (37.2 C) (Oral)  Resp 16  Ht 5\' 6"  (1.676 m)  Wt 267 lb 12.8 oz (121.473 kg)  BMI 43.24 kg/m2  SpO2 97%  Physical Exam  Constitutional: He is oriented to person, place, and time. He appears well-developed and  well-nourished. No distress.  HENT:  Head: Normocephalic and atraumatic.  Right Ear: Tympanic membrane is injected and retracted.  Left Ear: Tympanic membrane normal.  Nasal mucosal erythema.  Eyes: Pupils are equal, round, and reactive to light.  Neck: Normal range of motion. No thyromegaly present.  Cardiovascular: Normal rate, regular rhythm, S1 normal, S2 normal and normal heart sounds.   No murmur heard. Pulmonary/Chest: Effort normal and breath sounds normal. No respiratory distress. He has no wheezes. He has no rales.  Abdominal: Soft. Bowel sounds are normal. He exhibits no mass. There is no hepatosplenomegaly. There is no tenderness.  There is no CVA tenderness.  Musculoskeletal: Normal range of motion.  3+ pitting edema with mild hyperpigmentation and loss of hair on anterior shin.  Lymphadenopathy:    He has no cervical adenopathy.  Neurological: He is alert and oriented to person, place, and time.  Skin: Skin is warm and dry.  2 mm diameter blanching erythematous papule on right arm and right calf  Serpiginous shiny lesion  Psychiatric: He has a normal mood and affect. His behavior is normal.  Nursing note and vitals reviewed.  EKG findings: normal sinus rhythm 2013 lipids give ASCVD risk of 5.6%    Assessment & Plan:  Does not need to be on cholesterol medication at this time recommend to recheck lipids at physical next year.  Preventative health care  Essential hypertension, benign - Plan: EKG 12-Lead, POCT urinalysis dipstick, CBC, Comprehensive metabolic panel - well controlled - meds refilled.  Peripheral vascular disease - Plan: EKG 12-Lead - increase water and protein, decrease salts, cont exercise, try compression socks.  Insomnia - If pt needs refill for ambien or xanax for his traveling to [place], ok to call in for this.  Travel advice encounter - Plan: POCT urinalysis dipstick, HIV antibody, Hepatitis B surface antibody, Hepatitis A Antibody, Total, Varicella zoster antibody, IgG, Measles/Mumps/Rubella Immunity - going to spend a lot of time in [place] as job is transitioning there - will be going to [place], [place], [place].  Elev transaminase - ALT - low fat diet, recheck at next routine OV - mild and no h/o prior.  Meds ordered this encounter  Medications  . verapamil (VERELAN PM) 240 MG 24 hr capsule    Sig: Take 2 capsules (480 mg total) by mouth at bedtime.    Dispense:  180 capsule    Refill:  3  . hydrochlorothiazide (HYDRODIURIL) 25 MG tablet    Sig: Take 1 tablet (25 mg total) by mouth daily as needed.    Dispense:  90 tablet    Refill:  3    I personally performed the  services described in this documentation, which was scribed in my presence. The recorded information has been reviewed and considered, and addended by me as needed.  [name], MD MPH   Results for orders placed or performed in visit on 05/08/14  CBC  Result Value Ref Range   WBC 6.9 4.0 - 10.5 K/uL   RBC 5.08 4.22 - 5.81 MIL/uL   Hemoglobin 15.4 13.0 - 17.0 g/dL   HCT 44.9 39.0 - 52.0 %   MCV 88.4 78.0 - 100.0 fL   MCH 30.3 26.0 - 34.0 pg   MCHC 34.3 30.0 - 36.0 g/dL   RDW 13.6 11.5 - 15.5 %   Platelets 267 150 - 400 K/uL   MPV 11.3 8.6 - 12.4 fL  Comprehensive metabolic panel  Result Value Ref Range   Sodium 140  135 - 145 mEq/L   Potassium 4.0 3.5 - 5.3 mEq/L   Chloride 103 96 - 112 mEq/L   CO2 24 19 - 32 mEq/L   Glucose, Bld 84 70 - 99 mg/dL   BUN 14 6 - 23 mg/dL   Creat 1.16 0.50 - 1.35 mg/dL   Total Bilirubin 0.3 0.2 - 1.2 mg/dL   Alkaline Phosphatase 79 39 - 117 U/L   AST 37 0 - 37 U/L   ALT 64 (H) 0 - 53 U/L   Total Protein 6.8 6.0 - 8.3 g/dL   Albumin 4.2 3.5 - 5.2 g/dL   Calcium 9.2 8.4 - 10.5 mg/dL  HIV antibody  Result Value Ref Range   HIV 1&2 Ab, 4th Generation NONREACTIVE NONREACTIVE  Hepatitis B surface antibody  Result Value Ref Range   Hepatitis B-Post 0.2 mIU/mL  Hepatitis A Antibody, Total  Result Value Ref Range   Hep A Total Ab REACTIVE (A) NON REACTIVE  Varicella zoster antibody, IgG  Result Value Ref Range   Varicella IgG 223.10 (H) <135.00 Index  Measles/Mumps/Rubella Immunity  Result Value Ref Range   Rubella <0.10 <0.90 Index   Mumps IgG 28.20 (H) <9.00 AU/mL   Rubeola IgG 68.30 (H) <25.00 AU/mL  POCT urinalysis dipstick  Result Value Ref Range   Color, UA yellow    Clarity, UA clear    Glucose, UA neg    Bilirubin, UA neg    Ketones, UA neg    Spec Grav, UA 1.015    Blood, UA neg    pH, UA 6.5    Protein, UA neg    Urobilinogen, UA 0.2    Nitrite, UA neg    Leukocytes, UA Negative

## 2014-05-09 LAB — CBC
HCT: 44.9 % (ref 39.0–52.0)
Hemoglobin: 15.4 g/dL (ref 13.0–17.0)
MCH: 30.3 pg (ref 26.0–34.0)
MCHC: 34.3 g/dL (ref 30.0–36.0)
MCV: 88.4 fL (ref 78.0–100.0)
MPV: 11.3 fL (ref 8.6–12.4)
Platelets: 267 10*3/uL (ref 150–400)
RBC: 5.08 MIL/uL (ref 4.22–5.81)
RDW: 13.6 % (ref 11.5–15.5)
WBC: 6.9 10*3/uL (ref 4.0–10.5)

## 2014-05-09 LAB — HEPATITIS B SURFACE ANTIBODY, QUANTITATIVE: Hepatitis B-Post: 0.2 m[IU]/mL

## 2014-05-09 LAB — COMPREHENSIVE METABOLIC PANEL
ALT: 64 U/L — ABNORMAL HIGH (ref 0–53)
AST: 37 U/L (ref 0–37)
Albumin: 4.2 g/dL (ref 3.5–5.2)
Alkaline Phosphatase: 79 U/L (ref 39–117)
BUN: 14 mg/dL (ref 6–23)
CO2: 24 mEq/L (ref 19–32)
Calcium: 9.2 mg/dL (ref 8.4–10.5)
Chloride: 103 mEq/L (ref 96–112)
Creat: 1.16 mg/dL (ref 0.50–1.35)
Glucose, Bld: 84 mg/dL (ref 70–99)
Potassium: 4 mEq/L (ref 3.5–5.3)
Sodium: 140 mEq/L (ref 135–145)
Total Bilirubin: 0.3 mg/dL (ref 0.2–1.2)
Total Protein: 6.8 g/dL (ref 6.0–8.3)

## 2014-05-09 LAB — HEPATITIS A ANTIBODY, TOTAL: Hep A Total Ab: REACTIVE — AB

## 2014-05-09 LAB — HIV ANTIBODY (ROUTINE TESTING W REFLEX): HIV 1&2 Ab, 4th Generation: NONREACTIVE

## 2014-05-10 LAB — MEASLES/MUMPS/RUBELLA IMMUNITY
Mumps IgG: 28.2 AU/mL — ABNORMAL HIGH (ref <9.00)
Rubella: <0.1 Index (ref <0.90)
Rubeola IgG: 68.3 AU/mL — ABNORMAL HIGH (ref <25.00)

## 2014-05-10 LAB — VARICELLA ZOSTER ANTIBODY, IGG: Varicella IgG: 223.1 Index — ABNORMAL HIGH (ref <135.00)

## 2014-05-11 ENCOUNTER — Telehealth: Payer: Self-pay

## 2014-05-11 NOTE — Telephone Encounter (Signed)
Patient called back to follow up on refill request and states that he is completely out of the blood pressure medication. Please refill!

## 2014-05-11 NOTE — Telephone Encounter (Signed)
Pt is needing rx for verapamil (VERELAN PM) 240 MG 24 hr capsule [[id number]] sent to [facility] in [place]. We originally sent this to [facility], [place], but they are unable to fll at this time. Please advise once rx has been transferred.

## 2014-05-13 ENCOUNTER — Encounter: Payer: Self-pay | Admitting: Family Medicine

## 2014-05-13 MED ORDER — VERAPAMIL HCL ER 240 MG PO CP24: 480.0000 mg | ORAL_CAPSULE | Freq: Every day | ORAL | Status: DC

## 2014-05-13 NOTE — Telephone Encounter (Signed)
verapamil (VERELAN PM) 240 MG 24 hr capsule [[id number]]     Order Details    Dose: 480 mg Route: Oral Frequency: Daily at bedtime   Dispense Quantity:  180 capsule Refills:  3 Fills Remaining:  3          Sig: Take 2 capsules (480 mg total) by mouth at bedtime.         Written Date:  05/08/14 Expiration Date:  05/08/15     Start Date:  05/08/14 End Date:  --     Ordering Provider:  -- Authorizing Provider:  Shawnee Knapp, MD Ordering User:  Shawnee Knapp, MD                    Original Order:  verapamil (VERELAN PM) 240 MG 24 hr capsule [[id number]]        Pharmacy:  [facility], [place] and re-sent Rx. Called pt to let him know.

## 2014-07-04 ENCOUNTER — Telehealth: Payer: Self-pay

## 2014-07-04 NOTE — Telephone Encounter (Signed)
Unable to reach pt

## 2014-07-04 NOTE — Telephone Encounter (Signed)
Yes, sorry - pt should come back in as may have triggered gout flair or other condition that needs treated in addition to changing his bp pill.

## 2014-07-04 NOTE — Telephone Encounter (Signed)
Pt called back and the operators let him know that he would need to come back in.

## 2014-07-04 NOTE — Telephone Encounter (Signed)
Should pt return to clinic?

## 2014-07-04 NOTE — Telephone Encounter (Signed)
Pt of Dr.Shaw states that his diaretic is not working and that his high BP rx has made his left foot swell to the size of a grapefruit. Please advise

## 2015-04-07 ENCOUNTER — Ambulatory Visit (INDEPENDENT_AMBULATORY_CARE_PROVIDER_SITE_OTHER): Payer: BLUE CROSS/BLUE SHIELD | Admitting: Family Medicine

## 2015-04-07 ENCOUNTER — Ambulatory Visit (INDEPENDENT_AMBULATORY_CARE_PROVIDER_SITE_OTHER): Payer: BLUE CROSS/BLUE SHIELD

## 2015-04-07 VITALS — BP 156/92 | HR 74 | Temp 97.5°F | Resp 20 | Ht 66.0 in | Wt 282.6 lb

## 2015-04-07 DIAGNOSIS — L600 Ingrowing nail: Secondary | ICD-10-CM

## 2015-04-07 DIAGNOSIS — J3489 Other specified disorders of nose and nasal sinuses: Secondary | ICD-10-CM

## 2015-04-07 DIAGNOSIS — R600 Localized edema: Secondary | ICD-10-CM | POA: Diagnosis not present

## 2015-04-07 DIAGNOSIS — I10 Essential (primary) hypertension: Secondary | ICD-10-CM

## 2015-04-07 DIAGNOSIS — Z79899 Other long term (current) drug therapy: Secondary | ICD-10-CM | POA: Diagnosis not present

## 2015-04-07 DIAGNOSIS — M5432 Sciatica, left side: Secondary | ICD-10-CM

## 2015-04-07 DIAGNOSIS — H6983 Other specified disorders of Eustachian tube, bilateral: Secondary | ICD-10-CM

## 2015-04-07 LAB — POCT CBC
Granulocyte percent: 62.9 %G (ref 37–80)
HCT, POC: 42.1 % — AB (ref 43.5–53.7)
Hemoglobin: 14.1 g/dL (ref 14.1–18.1)
Lymph, poc: 1.8 (ref 0.6–3.4)
MCH, POC: 29.8 pg (ref 27–31.2)
MCHC: 33.6 g/dL (ref 31.8–35.4)
MCV: 88.9 fL (ref 80–97)
MID (cbc): 0.7 (ref 0–0.9)
MPV: 8.4 fL (ref 0–99.8)
POC Granulocyte: 4.3 (ref 2–6.9)
POC LYMPH PERCENT: 27.2 %L (ref 10–50)
POC MID %: 9.9 %M (ref 0–12)
Platelet Count, POC: 223 10*3/uL (ref 142–424)
RBC: 4.74 M/uL (ref 4.69–6.13)
RDW, POC: 13 %
WBC: 6.8 10*3/uL (ref 4.6–10.2)

## 2015-04-07 LAB — POCT SEDIMENTATION RATE: POCT SED RATE: 20 mm/hr (ref 0–22)

## 2015-04-07 LAB — POCT GLYCOSYLATED HEMOGLOBIN (HGB A1C): Hemoglobin A1C: 5.5

## 2015-04-07 MED ORDER — CYCLOBENZAPRINE HCL 10 MG PO TABS: 10.0000 mg | ORAL_TABLET | Freq: Three times a day (TID) | ORAL | Status: DC | PRN

## 2015-04-07 MED ORDER — PREDNISONE 20 MG PO TABS: ORAL_TABLET | ORAL | Status: DC

## 2015-04-07 MED ORDER — HYDROCODONE-ACETAMINOPHEN 5-325 MG PO TABS: 1.0000 | ORAL_TABLET | Freq: Four times a day (QID) | ORAL | Status: DC | PRN

## 2015-04-07 MED ORDER — TRIAMTERENE-HCTZ 37.5-25 MG PO TABS: 1.0000 | ORAL_TABLET | Freq: Every day | ORAL | Status: DC

## 2015-04-07 MED ORDER — CEPHALEXIN 500 MG PO CAPS: 500.0000 mg | ORAL_CAPSULE | Freq: Four times a day (QID) | ORAL | Status: DC

## 2015-04-07 NOTE — Progress Notes (Signed)
Subjective:    Patient ID: Philip Conley, male    DOB: 1970-08-08, 45 y.o.   MRN: [id number] By signing my name below, I, Philip Conley, attest that this documentation has been prepared under the direction and in the presence of Philip Cheadle, MD. Electronically Signed: Judithe Conley, ER Scribe. 04/07/2015. 6:17 PM.  Chief Complaint  Patient presents with  . Back Pain    lower back pain started tuesday     HPI HPI Comments: Philip Conley is a 45 y.o. male who presents to [facility] complaining of lower back pain for the last week. He has no past hx of lower back pain. It improved for the following three days, but then four days ago he had a sudden severe shooting pain down his leg. Then those sx improved until yesterday when the lower back pain with radiation down the left leg recurred. He has taken a large amount of tylenol and ibuprofen for the last week. He has been urinating more than usual, which he thinks is due to taking all the pain medications. He denies fever, chills, or abnormal BM sx.    He has also complaining of generalized difficulty breathing. He has also had scabbing in his right nare which obstructs his nostril making it more difficult to breath. He uses saline nasal spray, and coats his nostril with petroleum jelly. He used flonase last year but when he started using that medication he noticed the scabbing in his nose.  He is also complaining of left great toe pain after he peeled his toenail back accidentally. After the initial injury he dropped a heavy box on his toe and since that time he has had pain in the great toe.   Has a hx of degerative cervical disk disease. He has seen Dr. Patrice Conley   Past Medical History  Diagnosis Date  . Allergy    No Known Allergies  Current Outpatient Prescriptions on File Prior to Visit  Medication Sig Dispense Refill  . Aspirin-Acetaminophen-Caffeine (EXCEDRIN MIGRAINE PO) Take by mouth as needed.    . hydrochlorothiazide (HYDRODIURIL) 25  MG tablet Take 1 tablet (25 mg total) by mouth daily as needed. 90 tablet 3  . verapamil (VERELAN PM) 240 MG 24 hr capsule Take 2 capsules (480 mg total) by mouth at bedtime. 180 capsule 3   No current facility-administered medications on file prior to visit.    Review of Systems  Constitutional: Positive for activity change and unexpected weight change. Negative for fever, chills, diaphoresis and appetite change.  Respiratory: Negative for cough, chest tightness, shortness of breath and wheezing.   Cardiovascular: Negative for chest pain, palpitations and leg swelling.  Musculoskeletal: Positive for myalgias, back pain, arthralgias and gait problem. Negative for joint swelling.  Skin: Positive for color change and wound.  Neurological: Negative for weakness, numbness and headaches.      Objective:  BP 156/92 mmHg  Pulse 74  Temp(Src) 97.5 F (36.4 C) (Oral)  Resp 20  Ht 5\' 6"  (1.676 m)  Wt 282 lb 9.6 oz (128.187 kg)  BMI 45.63 kg/m2  SpO2 96%  Physical Exam  Constitutional: He is oriented to person, place, and time. He appears well-developed and well-nourished. No distress.  HENT:  Head: Normocephalic and atraumatic.  Both TMs retracted with erythema. Nares passages with a significant amount or irritation of the mucosa causing some closing off of the airway on the right nare septal side. Oropharynx clear with some mild erythema.  Eyes: Pupils are equal,  round, and reactive to light.  Neck: Neck supple.  Cardiovascular: Normal rate.   Pulmonary/Chest: Effort normal. No respiratory distress.  Musculoskeletal: Normal range of motion.  No point tenderness over the lumbar spine. No paraspinal muscle spasms. Some tenderness over the greater trochanter. Negative straight leg raise bilaterally. 4+ strength on left hip flexors. 5+ on right hip flexors. 5/5 strength on quadricepts, hamstrings and plantar and dorsiflexion.   Neurological: He is alert and oriented to person, place, and time.  Coordination normal.  Achilles and patellar reflexes 2+.  Skin: Skin is warm and dry. He is not diaphoretic.  1cm of granulation tissue growing over the distal edge of the left great toenail with signs of separation from the nail bed. Question of purulent drainage on the lateral aspect of the left great toe.  Psychiatric: He has a normal mood and affect. His behavior is normal.  Nursing note and vitals reviewed.    Dg Lumbar Spine 2-3 Views  04/07/2015  CLINICAL DATA:  Left sciatica with hip flexors weakness. EXAM: LUMBAR SPINE - 2-3 VIEW COMPARISON:  None. FINDINGS: There is no evidence of lumbar spine fracture. There is straightening of the physiologic lumbosacral lordosis. Intervertebral disc spaces are maintained. IMPRESSION: Straightening of the lumbosacral lordosis. No evidence of fracture, subluxation or significant arthropathy. Electronically Signed   By: Philip Conley M.D.   On: 04/07/2015 19:41    Assessment & Plan:   1. Sciatica neuralgia, left   2. Essential hypertension   3. Pedal edema   4. Ingrown left greater toenail   5. Medication management   6. Nasal sore   7. Eustachian tube dysfunction, bilateral     Orders Placed This Encounter  Procedures  . DG Lumbar Spine 2-3 Views    Standing Status: Future     Number of Occurrences: 1     Standing Expiration Date: 04/06/2016    Order Specific Question:  Reason for Exam (SYMPTOM  OR DIAGNOSIS REQUIRED)    Answer:  new left sciatica with hip flexor weakness x 1 wk, h/o DDD    Order Specific Question:  Preferred imaging location?    Answer:  External  . Comprehensive metabolic panel  . Ambulatory referral to ENT    Referral Priority:  Routine    Referral Type:  Consultation    Referral Reason:  Specialty Services Required    Requested Specialty:  Otolaryngology    Number of Visits Requested:  1  . POCT CBC  . POCT SEDIMENTATION RATE  . POCT glycosylated hemoglobin (Hb A1C)    Meds ordered this encounter    Medications  . triamterene-hydrochlorothiazide (MAXZIDE-25) 37.5-25 MG tablet    Sig: Take 1 tablet by mouth daily.    Dispense:  90 tablet    Refill:  1  . predniSONE (DELTASONE) 20 MG tablet    Sig: Take 3 tabs po qd x 3d, 2 tabs po qd 3d, 1 tab po qd x 3d, then stop    Dispense:  18 tablet    Refill:  0  . cephALEXin (KEFLEX) 500 MG capsule    Sig: Take 1 capsule (500 mg total) by mouth 4 (four) times daily.    Dispense:  28 capsule    Refill:  0  . HYDROcodone-acetaminophen (NORCO/VICODIN) 5-325 MG tablet    Sig: Take 1 tablet by mouth every 6 (six) hours as needed for moderate pain.    Dispense:  30 tablet    Refill:  0  . cyclobenzaprine (FLEXERIL) 10  MG tablet    Sig: Take 1 tablet (10 mg total) by mouth 3 (three) times daily as needed for muscle spasms.    Dispense:  30 tablet    Refill:  0   Over 40 min spent in face-to-face evaluation of and consultation with patient and coordination of care.  Over 50% of this time was spent counseling this patient.  I personally performed the services described in this documentation, which was scribed in my presence. The recorded information has been reviewed and considered, and addended by me as needed.  Philip Cheadle, MD MPH  Results for orders placed or performed in visit on 04/07/15  Comprehensive metabolic panel  Result Value Ref Range   Sodium 138 135 - 146 mmol/L   Potassium 4.0 3.5 - 5.3 mmol/L   Chloride 105 98 - 110 mmol/L   CO2 26 20 - 31 mmol/L   Glucose, Bld 86 65 - 99 mg/dL   BUN 13 7 - 25 mg/dL   Creat 1.00 0.60 - 1.35 mg/dL   Total Bilirubin 0.4 0.2 - 1.2 mg/dL   Alkaline Phosphatase 63 40 - 115 U/L   AST 35 10 - 40 U/L   ALT 62 (H) 9 - 46 U/L   Total Protein 6.9 6.1 - 8.1 g/dL   Albumin 4.3 3.6 - 5.1 g/dL   Calcium 9.0 8.6 - 10.3 mg/dL  POCT CBC  Result Value Ref Range   WBC 6.8 4.6 - 10.2 K/uL   Lymph, poc 1.8 0.6 - 3.4   POC LYMPH PERCENT 27.2 10 - 50 %L   MID (cbc) 0.7 0 - 0.9   POC MID % 9.9 0 - 12 %M    POC Granulocyte 4.3 2 - 6.9   Granulocyte percent 62.9 37 - 80 %G   RBC 4.74 4.69 - 6.13 M/uL   Hemoglobin 14.1 14.1 - 18.1 g/dL   HCT, POC 42.1 (A) 43.5 - 53.7 %   MCV 88.9 80 - 97 fL   MCH, POC 29.8 27 - 31.2 pg   MCHC 33.6 31.8 - 35.4 g/dL   RDW, POC 13.0 %   Platelet Count, POC 223 142 - 424 K/uL   MPV 8.4 0 - 99.8 fL  POCT SEDIMENTATION RATE  Result Value Ref Range   POCT SED RATE 20 0 - 22 mm/hr  POCT glycosylated hemoglobin (Hb A1C)  Result Value Ref Range   Hemoglobin A1C 5.5

## 2015-04-07 NOTE — Patient Instructions (Addendum)
Because you received an x-ray today, you will receive an invoice from [facility]. Please contact [facility] at [phone] with questions or concerns regarding your invoice. Our billing staff will not be able to assist you with those questions.   Sciatica With Rehab The sciatic nerve runs from the back down the leg and is responsible for sensation and control of the muscles in the back (posterior) side of the thigh, lower leg, and foot. Sciatica is a condition that is characterized by inflammation of this nerve.  SYMPTOMS   Signs of nerve damage, including numbness and/or weakness along the posterior side of the lower extremity.  Pain in the back of the thigh that may also travel down the leg.  Pain that worsens when sitting for long periods of time.  Occasionally, pain in the back or buttock. CAUSES  Inflammation of the sciatic nerve is the cause of sciatica. The inflammation is due to something irritating the nerve. Common sources of irritation include:  Sitting for long periods of time.  Direct trauma to the nerve.  Arthritis of the spine.  Herniated or ruptured disk.  Slipping of the vertebrae (spondylolisthesis).  Pressure from soft tissues, such as muscles or ligament-like tissue (fascia). RISK INCREASES WITH:  Sports that place pressure or stress on the spine (football or weightlifting).  Poor strength and flexibility.  Failure to warm up properly before activity.  Family history of low back pain or disk disorders.  Previous back injury or surgery.  Poor body mechanics, especially when lifting, or poor posture. PREVENTION   Warm up and stretch properly before activity.  Maintain physical fitness:  Strength, flexibility, and endurance.  Cardiovascular fitness.  Learn and use proper technique, especially with posture and lifting. When possible, have coach correct improper technique.  Avoid activities that place stress on the  spine. PROGNOSIS If treated properly, then sciatica usually resolves within 6 weeks. However, occasionally surgery is necessary.  RELATED COMPLICATIONS   Permanent nerve damage, including pain, numbness, tingle, or weakness.  Chronic back pain.  Risks of surgery: infection, bleeding, nerve damage, or damage to surrounding tissues. TREATMENT Treatment initially involves resting from any activities that aggravate your symptoms. The use of ice and medication may help reduce pain and inflammation. The use of strengthening and stretching exercises may help reduce pain with activity. These exercises may be performed at home or with referral to a therapist. A therapist may recommend further treatments, such as transcutaneous electronic nerve stimulation (TENS) or ultrasound. Your caregiver may recommend corticosteroid injections to help reduce inflammation of the sciatic nerve. If symptoms persist despite non-surgical (conservative) treatment, then surgery may be recommended. MEDICATION  If pain medication is necessary, then nonsteroidal anti-inflammatory medications, such as aspirin and ibuprofen, or other minor pain relievers, such as acetaminophen, are often recommended.  Do not take pain medication for 7 days before surgery.  Prescription pain relievers may be given if deemed necessary by your caregiver. Use only as directed and only as much as you need.  Ointments applied to the skin may be helpful.  Corticosteroid injections may be given by your caregiver. These injections should be reserved for the most serious cases, because they may only be given a certain number of times. HEAT AND COLD  Cold treatment (icing) relieves pain and reduces inflammation. Cold treatment should be applied for 10 to 15 minutes every 2 to 3 hours for inflammation and pain and immediately after any activity that aggravates your symptoms. Use ice packs or massage the  area with a piece of ice (ice massage).  Heat  treatment may be used prior to performing the stretching and strengthening activities prescribed by your caregiver, physical therapist, or athletic trainer. Use a heat pack or soak the injury in warm water. SEEK MEDICAL CARE IF:  Treatment seems to offer no benefit, or the condition worsens.  Any medications produce adverse side effects. EXERCISES  RANGE OF MOTION (ROM) AND STRETCHING EXERCISES - Sciatica Most people with sciatic will find that their symptoms worsen with either excessive bending forward (flexion) or arching at the low back (extension). The exercises which will help resolve your symptoms will focus on the opposite motion. Your physician, physical therapist or athletic trainer will help you determine which exercises will be most helpful to resolve your low back pain. Do not complete any exercises without first consulting with your clinician. Discontinue any exercises which worsen your symptoms until you speak to your clinician. If you have pain, numbness or tingling which travels down into your buttocks, leg or foot, the goal of the therapy is for these symptoms to move closer to your back and eventually resolve. Occasionally, these leg symptoms will get better, but your low back pain may worsen; this is typically an indication of progress in your rehabilitation. Be certain to be very alert to any changes in your symptoms and the activities in which you participated in the 24 hours prior to the change. Sharing this information with your clinician will allow him/her to most efficiently treat your condition. These exercises may help you when beginning to rehabilitate your injury. Your symptoms may resolve with or without further involvement from your physician, physical therapist or athletic trainer. While completing these exercises, remember:   Restoring tissue flexibility helps normal motion to return to the joints. This allows healthier, less painful movement and activity.  An effective  stretch should be held for at least 30 seconds.  A stretch should never be painful. You should only feel a gentle lengthening or release in the stretched tissue. FLEXION RANGE OF MOTION AND STRETCHING EXERCISES: STRETCH - Flexion, Single Knee to Chest   Lie on a firm bed or floor with both legs extended in front of you.  Keeping one leg in contact with the floor, bring your opposite knee to your chest. Hold your leg in place by either grabbing behind your thigh or at your knee.  Pull until you feel a gentle stretch in your low back. Hold __________ seconds.  Slowly release your grasp and repeat the exercise with the opposite side. Repeat __________ times. Complete this exercise __________ times per day.  STRETCH - Flexion, Double Knee to Chest  Lie on a firm bed or floor with both legs extended in front of you.  Keeping one leg in contact with the floor, bring your opposite knee to your chest.  Tense your stomach muscles to support your back and then lift your other knee to your chest. Hold your legs in place by either grabbing behind your thighs or at your knees.  Pull both knees toward your chest until you feel a gentle stretch in your low back. Hold __________ seconds.  Tense your stomach muscles and slowly return one leg at a time to the floor. Repeat __________ times. Complete this exercise __________ times per day.  STRETCH - Low Trunk Rotation   Lie on a firm bed or floor. Keeping your legs in front of you, bend your knees so they are both pointed toward the ceiling and  your feet are flat on the floor.  Extend your arms out to the side. This will stabilize your upper body by keeping your shoulders in contact with the floor.  Gently and slowly drop both knees together to one side until you feel a gentle stretch in your low back. Hold for __________ seconds.  Tense your stomach muscles to support your low back as you bring your knees back to the starting position. Repeat the  exercise to the other side. Repeat __________ times. Complete this exercise __________ times per day  EXTENSION RANGE OF MOTION AND FLEXIBILITY EXERCISES: STRETCH - Extension, Prone on Elbows  Lie on your stomach on the floor, a bed will be too soft. Place your palms about shoulder width apart and at the height of your head.  Place your elbows under your shoulders. If this is too painful, stack pillows under your chest.  Allow your body to relax so that your hips drop lower and make contact more completely with the floor.  Hold this position for __________ seconds.  Slowly return to lying flat on the floor. Repeat __________ times. Complete this exercise __________ times per day.  RANGE OF MOTION - Extension, Prone Press Ups  Lie on your stomach on the floor, a bed will be too soft. Place your palms about shoulder width apart and at the height of your head.  Keeping your back as relaxed as possible, slowly straighten your elbows while keeping your hips on the floor. You may adjust the placement of your hands to maximize your comfort. As you gain motion, your hands will come more underneath your shoulders.  Hold this position __________ seconds.  Slowly return to lying flat on the floor. Repeat __________ times. Complete this exercise __________ times per day.  STRENGTHENING EXERCISES - Sciatica  These exercises may help you when beginning to rehabilitate your injury. These exercises should be done near your "sweet spot." This is the neutral, low-back arch, somewhere between fully rounded and fully arched, that is your least painful position. When performed in this safe range of motion, these exercises can be used for people who have either a flexion or extension based injury. These exercises may resolve your symptoms with or without further involvement from your physician, physical therapist or athletic trainer. While completing these exercises, remember:   Muscles can gain both the  endurance and the strength needed for everyday activities through controlled exercises.  Complete these exercises as instructed by your physician, physical therapist or athletic trainer. Progress with the resistance and repetition exercises only as your caregiver advises.  You may experience muscle soreness or fatigue, but the pain or discomfort you are trying to eliminate should never worsen during these exercises. If this pain does worsen, stop and make certain you are following the directions exactly. If the pain is still present after adjustments, discontinue the exercise until you can discuss the trouble with your clinician. STRENGTHENING - Deep Abdominals, Pelvic Tilt   Lie on a firm bed or floor. Keeping your legs in front of you, bend your knees so they are both pointed toward the ceiling and your feet are flat on the floor.  Tense your lower abdominal muscles to press your low back into the floor. This motion will rotate your pelvis so that your tail bone is scooping upwards rather than pointing at your feet or into the floor.  With a gentle tension and even breathing, hold this position for __________ seconds. Repeat __________ times. Complete this exercise __________  times per day.  STRENGTHENING - Abdominals, Crunches   Lie on a firm bed or floor. Keeping your legs in front of you, bend your knees so they are both pointed toward the ceiling and your feet are flat on the floor. Cross your arms over your chest.  Slightly tip your chin down without bending your neck.  Tense your abdominals and slowly lift your trunk high enough to just clear your shoulder blades. Lifting higher can put excessive stress on the low back and does not further strengthen your abdominal muscles.  Control your return to the starting position. Repeat __________ times. Complete this exercise __________ times per day.  STRENGTHENING - Quadruped, Opposite UE/LE Lift  Assume a hands and knees position on a firm  surface. Keep your hands under your shoulders and your knees under your hips. You may place padding under your knees for comfort.  Find your neutral spine and gently tense your abdominal muscles so that you can maintain this position. Your shoulders and hips should form a rectangle that is parallel with the floor and is not twisted.  Keeping your trunk steady, lift your right hand no higher than your shoulder and then your left leg no higher than your hip. Make sure you are not holding your breath. Hold this position __________ seconds.  Continuing to keep your abdominal muscles tense and your back steady, slowly return to your starting position. Repeat with the opposite arm and leg. Repeat __________ times. Complete this exercise __________ times per day.  STRENGTHENING - Abdominals and Quadriceps, Straight Leg Raise   Lie on a firm bed or floor with both legs extended in front of you.  Keeping one leg in contact with the floor, bend the other knee so that your foot can rest flat on the floor.  Find your neutral spine, and tense your abdominal muscles to maintain your spinal position throughout the exercise.  Slowly lift your straight leg off the floor about 6 inches for a count of 15, making sure to not hold your breath.  Still keeping your neutral spine, slowly lower your leg all the way to the floor. Repeat this exercise with each leg __________ times. Complete this exercise __________ times per day. POSTURE AND BODY MECHANICS CONSIDERATIONS - Sciatica Keeping correct posture when sitting, standing or completing your activities will reduce the stress put on different body tissues, allowing injured tissues a chance to heal and limiting painful experiences. The following are general guidelines for improved posture. Your physician or physical therapist will provide you with any instructions specific to your needs. While reading these guidelines, remember:  The exercises prescribed by your  provider will help you have the flexibility and strength to maintain correct postures.  The correct posture provides the optimal environment for your joints to work. All of your joints have less wear and tear when properly supported by a spine with good posture. This means you will experience a healthier, less painful body.  Correct posture must be practiced with all of your activities, especially prolonged sitting and standing. Correct posture is as important when doing repetitive low-stress activities (typing) as it is when doing a single heavy-load activity (lifting). RESTING POSITIONS Consider which positions are most painful for you when choosing a resting position. If you have pain with flexion-based activities (sitting, bending, stooping, squatting), choose a position that allows you to rest in a less flexed posture. You would want to avoid curling into a fetal position on your side. If your pain  worsens with extension-based activities (prolonged standing, working overhead), avoid resting in an extended position such as sleeping on your stomach. Most people will find more comfort when they rest with their spine in a more neutral position, neither too rounded nor too arched. Lying on a non-sagging bed on your side with a pillow between your knees, or on your back with a pillow under your knees will often provide some relief. Keep in mind, being in any one position for a prolonged period of time, no matter how correct your posture, can still lead to stiffness. PROPER SITTING POSTURE In order to minimize stress and discomfort on your spine, you must sit with correct posture Sitting with good posture should be effortless for a healthy body. Returning to good posture is a gradual process. Many people can work toward this most comfortably by using various supports until they have the flexibility and strength to maintain this posture on their own. When sitting with proper posture, your ears will fall over  your shoulders and your shoulders will fall over your hips. You should use the back of the chair to support your upper back. Your low back will be in a neutral position, just slightly arched. You may place a small pillow or folded towel at the base of your low back for support.  When working at a desk, create an environment that supports good, upright posture. Without extra support, muscles fatigue and lead to excessive strain on joints and other tissues. Keep these recommendations in mind: CHAIR:   A chair should be able to slide under your desk when your back makes contact with the back of the chair. This allows you to work closely.  The chair's height should allow your eyes to be level with the upper part of your monitor and your hands to be slightly lower than your elbows. BODY POSITION  Your feet should make contact with the floor. If this is not possible, use a foot rest.  Keep your ears over your shoulders. This will reduce stress on your neck and low back. INCORRECT SITTING POSTURES   If you are feeling tired and unable to assume a healthy sitting posture, do not slouch or slump. This puts excessive strain on your back tissues, causing more damage and pain. Healthier options include:  Using more support, like a lumbar pillow.  Switching tasks to something that requires you to be upright or walking.  Talking a brief walk.  Lying down to rest in a neutral-spine position. PROLONGED STANDING WHILE SLIGHTLY LEANING FORWARD  When completing a task that requires you to lean forward while standing in one place for a long time, place either foot up on a stationary 2-4 inch high object to help maintain the best posture. When both feet are on the ground, the low back tends to lose its slight inward curve. If this curve flattens (or becomes too large), then the back and your other joints will experience too much stress, fatigue more quickly and can cause pain.  CORRECT STANDING POSTURES Proper  standing posture should be assumed with all daily activities, even if they only take a few moments, like when brushing your teeth. As in sitting, your ears should fall over your shoulders and your shoulders should fall over your hips. You should keep a slight tension in your abdominal muscles to brace your spine. Your tailbone should point down to the ground, not behind your body, resulting in an over-extended swayback posture.  INCORRECT STANDING POSTURES  Common incorrect  standing postures include a forward head, locked knees and/or an excessive swayback. WALKING Walk with an upright posture. Your ears, shoulders and hips should all line-up. PROLONGED ACTIVITY IN A FLEXED POSITION When completing a task that requires you to bend forward at your waist or lean over a low surface, try to find a way to stabilize 3 of 4 of your limbs. You can place a hand or elbow on your thigh or rest a knee on the surface you are reaching across. This will provide you more stability so that your muscles do not fatigue as quickly. By keeping your knees relaxed, or slightly bent, you will also reduce stress across your low back. CORRECT LIFTING TECHNIQUES DO :   Assume a wide stance. This will provide you more stability and the opportunity to get as close as possible to the object which you are lifting.  Tense your abdominals to brace your spine; then bend at the knees and hips. Keeping your back locked in a neutral-spine position, lift using your leg muscles. Lift with your legs, keeping your back straight.  Test the weight of unknown objects before attempting to lift them.  Try to keep your elbows locked down at your sides in order get the best strength from your shoulders when carrying an object.  Always ask for help when lifting heavy or awkward objects. INCORRECT LIFTING TECHNIQUES DO NOT:   Lock your knees when lifting, even if it is a small object.  Bend and twist. Pivot at your feet or move your feet  when needing to change directions.  Assume that you cannot safely pick up a paperclip without proper posture.   This information is not intended to replace advice given to you by your health care provider. Make sure you discuss any questions you have with your health care provider.   Document Released: 03/01/2005 Document Revised: 07/16/2014 Document Reviewed: 06/13/2008 Elsevier Interactive Patient Education [employer].

## 2015-04-07 NOTE — Progress Notes (Signed)
Procedure: Patient anesthetized with 2% Lidocaine without epi via digital nerve block.  Granulation tissue debrided.  Medial 1/8th of the great nail lifted and cut away.  No complications. Philis Fendt, MS, PA-C 7:21 PM, 04/07/2015

## 2015-04-08 LAB — COMPREHENSIVE METABOLIC PANEL
ALT: 62 U/L — ABNORMAL HIGH (ref 9–46)
AST: 35 U/L (ref 10–40)
Albumin: 4.3 g/dL (ref 3.6–5.1)
Alkaline Phosphatase: 63 U/L (ref 40–115)
BUN: 13 mg/dL (ref 7–25)
CO2: 26 mmol/L (ref 20–31)
Calcium: 9 mg/dL (ref 8.6–10.3)
Chloride: 105 mmol/L (ref 98–110)
Creat: 1 mg/dL (ref 0.60–1.35)
Glucose, Bld: 86 mg/dL (ref 65–99)
Potassium: 4 mmol/L (ref 3.5–5.3)
Sodium: 138 mmol/L (ref 135–146)
Total Bilirubin: 0.4 mg/dL (ref 0.2–1.2)
Total Protein: 6.9 g/dL (ref 6.1–8.1)

## 2015-04-11 ENCOUNTER — Encounter: Payer: Self-pay | Admitting: Family Medicine

## 2015-05-22 ENCOUNTER — Ambulatory Visit (INDEPENDENT_AMBULATORY_CARE_PROVIDER_SITE_OTHER): Payer: BLUE CROSS/BLUE SHIELD

## 2015-05-22 ENCOUNTER — Ambulatory Visit (INDEPENDENT_AMBULATORY_CARE_PROVIDER_SITE_OTHER): Payer: BLUE CROSS/BLUE SHIELD | Admitting: Physician Assistant

## 2015-05-22 VITALS — BP 136/88 | HR 78 | Temp 98.2°F | Resp 16 | Ht 66.0 in | Wt 283.8 lb

## 2015-05-22 DIAGNOSIS — S61401A Unspecified open wound of right hand, initial encounter: Secondary | ICD-10-CM

## 2015-05-22 DIAGNOSIS — L918 Other hypertrophic disorders of the skin: Secondary | ICD-10-CM | POA: Diagnosis not present

## 2015-05-22 NOTE — Progress Notes (Signed)
[facility] [address] [place] [address] [phone]  Date:  05/22/2015   Name:  Philip Conley   DOB:  [birth year]   MRN:  [id number]  PCP:  [name], MD    Chief Complaint: Hand Problem   History of Present Illness:  This is a 45 y.o. male with PMH HLD, HTN, migraines, insomnia who is presenting with a lesion on his right hand that he thinks is infected. States about 6 weeks ago he was working on his daughter's car and a sliver of metal got stuck in his right palm. He pulled it out and lesion started to heal. About 3 weeks ago the skin over the lesion started to peel and pink tissue started to emerge. It is now getting to point where pink tissue is protruding from skin. The area is very tender. There is drainage. No fever or chills. This has never happened before. He does not have a hx of DM -- A1C 6 weeks ago 5.5.  Review of Systems:  Review of Systems See HPI  Patient Active Problem List   Diagnosis Date Noted  . Hyperlipidemia LDL goal < 130 03/31/2012  . Migraine headache 03/31/2012  . Encounter for long-term (current) use of other medications 03/31/2012  . Insomnia 02/25/2012  . Hypertension 02/25/2012    Prior to Admission medications   Medication Sig Start Date End Date Taking? Authorizing Provider  Aspirin-Acetaminophen-Caffeine (EXCEDRIN MIGRAINE PO) Take by mouth as needed.   Yes Historical Provider, MD  cyclobenzaprine (FLEXERIL) 10 MG tablet Take 1 tablet (10 mg total) by mouth 3 (three) times daily as needed for muscle spasms. 04/07/15  Yes Shawnee Knapp, MD  HYDROcodone-acetaminophen (NORCO/VICODIN) 5-325 MG tablet Take 1 tablet by mouth every 6 (six) hours as needed for moderate pain. 04/07/15  Yes Shawnee Knapp, MD  triamterene-hydrochlorothiazide (MAXZIDE-25) 37.5-25 MG tablet Take 1 tablet by mouth daily. 04/07/15  Yes Shawnee Knapp, MD  verapamil (VERELAN PM) 240 MG 24 hr capsule Take 2 capsules (480 mg total) by mouth at bedtime. 05/13/14  Yes Shawnee Knapp, MD    No Known Allergies  History reviewed. No pertinent past surgical history.  Social History  Substance Use Topics  . Smoking status: Never Smoker   . Smokeless tobacco: None  . Alcohol Use: None    Family History  Problem Relation Age of Onset  . Heart disease Mother     mumur  . Lupus Father   . Heart disease Maternal Grandmother   . Lung disease Maternal Grandfather   . Heart disease Paternal Grandmother   . Stroke Paternal Grandfather     Medication list has been reviewed and updated.  Physical Examination:  Physical Exam  Constitutional: He is oriented to person, place, and time. He appears well-developed and well-nourished. No distress.  HENT:  Head: Normocephalic and atraumatic.  Right Ear: Hearing normal.  Left Ear: Hearing normal.  Nose: Nose normal.  Eyes: Conjunctivae and lids are normal. Right eye exhibits no discharge. Left eye exhibits no discharge. No scleral icterus.  Pulmonary/Chest: Effort normal. No respiratory distress.  Musculoskeletal: Normal range of motion.  Neurological: He is alert and oriented to person, place, and time.  Skin: Skin is warm and dry.  Right palm over thenar eminence just distal to wrist with 0.75 cm protruding granulation tissue. Surrounding local erythema and tenderness. Serous drainage present. No purulence. No fluctuance or induration.  Psychiatric: He has a normal mood and affect. His  speech is normal and behavior is normal. Thought content normal.   BP 136/88 mmHg  Pulse 78  Temp(Src) 98.2 F (36.8 C) (Oral)  Resp 16  Ht 5\' 6"  (1.676 m)  Wt 283 lb 12.8 oz (128.731 kg)  BMI 45.83 kg/m2  SpO2 98%  Procedure: Verbal consent obtained. Skin was cleaned with alcohol and anesthetized with 1 cc 1% lido with epi. Granulation tissue scraped away with curette. Underneath revealed a stalk of granulation tissue. Silver nitrate applied to stalk. Dressing applied.  Dg Hand Complete Right  05/22/2015  CLINICAL DATA:   cut on his right hand 2 weeks ago, and that he thinks is infected over thenar eminence just distal to wrist joint. EXAM: RIGHT HAND - COMPLETE 3+ VIEW COMPARISON:  None. FINDINGS: There is no evidence of fracture or dislocation. There is no evidence of arthropathy or other focal bone abnormality. Soft tissues are unremarkable. IMPRESSION: Negative. Electronically Signed   By: Skipper Cliche M.D.   On: 05/22/2015 19:13   Assessment and Plan:  1. Hypertrophic granulation tissue 2. Open wound, hand, right, initial encounter Radiograph negative for foreign body. Overgrowth of granulation tissue. Granulation tissue scraped away and silver nitrate applied to stalk. Dressing applied. Wound care discussed. Return as needed. - DG Hand Complete Right; Future   Benjaman Pott. Drenda Freeze, MHS [facility]  05/22/2015

## 2015-05-22 NOTE — Patient Instructions (Addendum)
Because you received an x-ray today, you will receive an invoice from [facility]. Please contact [facility] at [phone] with questions or concerns regarding your invoice. Our billing staff will not be able to assist you with those questions.  Change dressing once a day for next 2-3 days. Return as needed.

## 2015-05-29 ENCOUNTER — Other Ambulatory Visit: Payer: Self-pay | Admitting: Family Medicine

## 2015-06-03 ENCOUNTER — Telehealth: Payer: Self-pay | Admitting: Family Medicine

## 2015-06-03 NOTE — Telephone Encounter (Signed)
Patient declines flu shot at this time.

## 2015-07-05 ENCOUNTER — Other Ambulatory Visit: Payer: Self-pay | Admitting: Family Medicine

## 2015-07-05 ENCOUNTER — Ambulatory Visit (INDEPENDENT_AMBULATORY_CARE_PROVIDER_SITE_OTHER): Payer: BLUE CROSS/BLUE SHIELD | Admitting: Family Medicine

## 2015-07-05 VITALS — BP 128/70 | HR 97 | Temp 97.6°F | Resp 14 | Ht 66.0 in | Wt 280.0 lb

## 2015-07-05 DIAGNOSIS — S61401S Unspecified open wound of right hand, sequela: Secondary | ICD-10-CM | POA: Diagnosis not present

## 2015-07-05 DIAGNOSIS — E069 Thyroiditis, unspecified: Secondary | ICD-10-CM

## 2015-07-05 DIAGNOSIS — S81802A Unspecified open wound, left lower leg, initial encounter: Secondary | ICD-10-CM | POA: Diagnosis not present

## 2015-07-05 DIAGNOSIS — L03115 Cellulitis of right lower limb: Secondary | ICD-10-CM | POA: Diagnosis not present

## 2015-07-05 DIAGNOSIS — L980 Pyogenic granuloma: Secondary | ICD-10-CM | POA: Diagnosis not present

## 2015-07-05 MED ORDER — MUPIROCIN 2 % EX OINT: 1.0000 "application " | TOPICAL_OINTMENT | Freq: Two times a day (BID) | CUTANEOUS | Status: DC

## 2015-07-05 MED ORDER — FUROSEMIDE 20 MG PO TABS: 20.0000 mg | ORAL_TABLET | Freq: Every day | ORAL | Status: DC

## 2015-07-05 MED ORDER — CEPHALEXIN 500 MG PO CAPS: 500.0000 mg | ORAL_CAPSULE | Freq: Four times a day (QID) | ORAL | Status: DC

## 2015-07-05 MED ORDER — VERAPAMIL HCL ER 240 MG PO CP24: 240.0000 mg | ORAL_CAPSULE | Freq: Every day | ORAL | Status: DC

## 2015-07-05 NOTE — Progress Notes (Addendum)
By signing my name below I, Tereasa Coop, attest that this documentation has been prepared under the direction and in the presence of [name], MD. Electonically Signed. Tereasa Coop, Scribe 07/05/2015 at 1:18 PM   Subjective:    Patient ID: Philip Conley, male    DOB: 05-24-1970, 45 y.o.   MRN: [id number]  Chief Complaint  Patient presents with  . Hand Pain    rigth hand pain, mass 2 months  . Leg Injury    right leg , hit it in Monday    HPI Philip Conley is a 45 y.o. male who [facility] to the [facility] complaining of rt hand pain. Pt states that he had a wound that was healing and he had a scab that fell off 10 days ago. pt states that it is starting to hurt constantly and states that hand is starting to turn red and swell around the wound. Pt states wound has been draining a clear yellow fluid for the past 3 days.  Pt was seen 6 weeks ago by a PA after he had had a metal sliver in his rt palm that he had removed himself 6 weeks prior to visit. PA's note shows that the pt's rt hand had developed granulated tissue reaction. Pt's Xray was negative for FB. Pt's wound and granulation tissue was debrided.   Pt also reports new wound on lower rt leg that has surrounding erythema and swelling. Pt states that his new wound occurred 5 days ago and was a 7 cm long abrasion that occurred after running his leg into something. Pt reports wound started draining clear yellow fluid last night.  Pt also reports that he was having bilat lower leg edema and then he started fluid pills which resolved his symptoms for the first 6 weeks, then the edema and red spots started to appear again.    Pt was also seen 3 months ago for left sided sciatica.  Past Medical History  Diagnosis Date  . Allergy     Current outpatient prescriptions:  .  triamterene-hydrochlorothiazide (MAXZIDE-25) 37.5-25 MG tablet, Take 1 tablet by mouth daily., Disp: 90 tablet, Rfl: 1 .  verapamil (VERELAN PM)  240 MG 24 hr capsule, take 2 capsules at bedtime, Disp: 180 capsule, Rfl: 0 .  Aspirin-Acetaminophen-Caffeine (EXCEDRIN MIGRAINE PO), Take by mouth as needed. Reported on 07/05/2015, Disp: , Rfl:  .  cyclobenzaprine (FLEXERIL) 10 MG tablet, Take 1 tablet (10 mg total) by mouth 3 (three) times daily as needed for muscle spasms. (Patient not taking: Reported on 07/05/2015), Disp: 30 tablet, Rfl: 0 .  HYDROcodone-acetaminophen (NORCO/VICODIN) 5-325 MG tablet, Take 1 tablet by mouth every 6 (six) hours as needed for moderate pain. (Patient not taking: Reported on 07/05/2015), Disp: 30 tablet, Rfl: 0  No Known Allergies  Depression screen [facility] 2/9 07/05/2015 05/22/2015 04/07/2015  Decreased Interest 0 0 0  Down, Depressed, Hopeless 0 0 0  PHQ - 2 Score 0 0 0       Review of Systems  Constitutional: Negative for fever.  HENT: Negative for congestion.   Eyes: Negative for visual disturbance.  Respiratory: Negative for cough.   Cardiovascular: Positive for leg swelling. Negative for chest pain.  Gastrointestinal: Negative for abdominal pain.  Genitourinary: Negative for difficulty urinating.  Musculoskeletal: Positive for myalgias, joint swelling and arthralgias.  Skin: Positive for color change, rash and wound.  Neurological: Negative for headaches.  Hematological: Does not bruise/bleed easily.  Psychiatric/Behavioral: Negative for agitation.  Objective:   Physical Exam  Constitutional: He is oriented to person, place, and time. He appears well-developed and well-nourished. No distress.  HENT:  Head: Normocephalic and atraumatic.  Eyes: Conjunctivae are normal. Pupils are equal, round, and reactive to light.  Neck: Neck supple.  Cardiovascular: Normal rate.   Pulmonary/Chest: Effort normal.  Musculoskeletal: Normal range of motion. He exhibits edema (2+ tight bipedal edema R>L).  Neurological: He is alert and oriented to person, place, and time.  Skin: Skin is warm and dry.  Rt  palm: 1cm round grnulomadous lesion mushrooming out of his rt thenar  eminence with surrounding skin pulling away from base and having blanching erythema extending 3 cm diameter around lesion with tenderness and induration Rt anterior lower leg: 8 cm laceration, well healed with scab with surrounding 4-5cm blanching erythema, no fluctuance or warmth, no tenderness, with induration.  Psychiatric: He has a normal mood and affect. His behavior is normal.  Nursing note and vitals reviewed.  BP 128/70 mmHg  Pulse 97  Temp(Src) 97.6 F (36.4 C) (Oral)  Resp 14  Ht 5\' 6"  (1.676 m)  Wt 280 lb (127.007 kg)  BMI 45.21 kg/m2  SpO2 98%        Assessment & Plan:  Pt recommended to elevate legs daily and also to ice areas of erythema daily. Pt told to return if areas of erythema start increasing in size and are seen outside the marked boundaries. Pt also told to return if he notices any red streaking coming from or around his wounds.   1. Pyogenic granuloma - on left thenar eminence, recurred larger and more painful after prior curette removal with attempted chemical cautery of stalk with silver eminence - likely just inflamed but will be covered for infection due to leg cellulitis anyway  2. Hand, open wound except fingers, complicated, right, sequela   3. Leg wound, left, initial encounter   4. Cellulitis of right lower extremity   Cont maxzide which initially helped pedal edema - try lasix but will hopefully be able to stop after cellulitis resolves. increase K in diet  Orders Placed This Encounter  Procedures  . Ambulatory referral to Hand Surgery    Referral Priority:  Routine    Referral Type:  Surgical    Referral Reason:  Specialty Services Required    Requested Specialty:  Hand Surgery    Number of Visits Requested:  1    Meds ordered this encounter  Medications  . cephALEXin (KEFLEX) 500 MG capsule    Sig: Take 1 capsule (500 mg total) by mouth 4 (four) times daily.    Dispense:   40 capsule    Refill:  0  . furosemide (LASIX) 20 MG tablet    Sig: Take 1 tablet (20 mg total) by mouth daily.    Dispense:  30 tablet    Refill:  0  . verapamil (VERELAN PM) 240 MG 24 hr capsule    Sig: Take 1 capsule (240 mg total) by mouth at bedtime.    Dispense:  90 capsule    Refill:  1    No more refills without office visit  . mupirocin ointment (BACTROBAN) 2 %    Sig: Apply 1 application topically 2 (two) times daily.    Dispense:  30 g    Refill:  1    I personally performed the services described in this documentation, which was scribed in my presence. The recorded information has been reviewed and considered, and addended by  me as needed.  [name], MD MPH

## 2015-07-05 NOTE — Patient Instructions (Addendum)
     IF you received an x-ray today, you will receive an invoice from [facility]. Please contact [facility] at [phone] with questions or concerns regarding your invoice.   IF you received labwork today, you will receive an invoice from [facility]. Please contact [facility] at [phone] with questions or concerns regarding your invoice.   Our billing staff will not be able to assist you with questions regarding bills from these companies.  You will be contacted with the lab results as soon as they are available. The fastest way to get your results is to activate your My Chart account. Instructions are located on the last page of this paperwork. If you have not heard from us regarding the results in 2 weeks, please contact this office.      Cellulitis Cellulitis is an infection of the skin and the tissue beneath it. The infected area is usually red and tender. Cellulitis occurs most often in the arms and lower legs.  CAUSES  Cellulitis is caused by bacteria that enter the skin through cracks or cuts in the skin. The most common types of bacteria that cause cellulitis are staphylococci and streptococci. SIGNS AND SYMPTOMS   Redness and warmth.  Swelling.  Tenderness or pain.  Fever. DIAGNOSIS  Your health care provider can usually determine what is wrong based on a physical exam. Blood tests may also be done. TREATMENT  Treatment usually involves taking an antibiotic medicine. HOME CARE INSTRUCTIONS   Take your antibiotic medicine as directed by your health care provider. Finish the antibiotic even if you start to feel better.  Keep the infected arm or leg elevated to reduce swelling.  Apply a warm cloth to the affected area up to 4 times per day to relieve pain.  Take medicines only as directed by your health care provider.  Keep all follow-up visits as directed by your health care provider. SEEK MEDICAL CARE IF:   You  notice red streaks coming from the infected area.  Your red area gets larger or turns dark in color.  Your bone or joint underneath the infected area becomes painful after the skin has healed.  Your infection returns in the same area or another area.  You notice a swollen bump in the infected area.  You develop new symptoms.  You have a fever. SEEK IMMEDIATE MEDICAL CARE IF:   You feel very sleepy.  You develop vomiting or diarrhea.  You have a general ill feeling (malaise) with muscle aches and pains.   This information is not intended to replace advice given to you by your health care provider. Make sure you discuss any questions you have with your health care provider.   Document Released: 12/09/2004 Document Revised: 11/20/2014 Document Reviewed: 05/17/2011 Elsevier Interactive Patient Education 2016 [employer]  

## 2015-07-31 DIAGNOSIS — R2231 Localized swelling, mass and lump, right upper limb: Secondary | ICD-10-CM | POA: Diagnosis not present

## 2015-07-31 DIAGNOSIS — L980 Pyogenic granuloma: Secondary | ICD-10-CM | POA: Diagnosis not present

## 2015-08-13 ENCOUNTER — Other Ambulatory Visit: Payer: Self-pay | Admitting: Family Medicine

## 2015-08-14 ENCOUNTER — Telehealth: Payer: Self-pay | Admitting: Emergency Medicine

## 2015-08-14 DIAGNOSIS — L980 Pyogenic granuloma: Secondary | ICD-10-CM | POA: Diagnosis not present

## 2015-08-14 DIAGNOSIS — R2231 Localized swelling, mass and lump, right upper limb: Secondary | ICD-10-CM | POA: Diagnosis not present

## 2015-08-14 NOTE — Telephone Encounter (Signed)
Pt still experiencing swelling in lower extremities, denies SOB or chest pain Reordered Lasix with instructions to take as needed

## 2015-09-05 ENCOUNTER — Other Ambulatory Visit: Payer: Self-pay | Admitting: Family Medicine

## 2016-01-29 ENCOUNTER — Other Ambulatory Visit: Payer: Self-pay | Admitting: Family Medicine

## 2016-02-28 ENCOUNTER — Other Ambulatory Visit: Payer: Self-pay | Admitting: Family Medicine

## 2016-03-05 ENCOUNTER — Other Ambulatory Visit: Payer: Self-pay

## 2016-03-05 MED ORDER — VERAPAMIL HCL ER 240 MG PO CP24: 240.0000 mg | ORAL_CAPSULE | Freq: Every day | ORAL | 0 refills | Status: DC

## 2016-03-05 NOTE — Telephone Encounter (Signed)
Pt LM concerning his med RF being declined. I called him back and explained need for f/up but apologized that we didn't reach him by phone first. Pt stated that he had gotten message on last RF and called [place] to say that he has gotten a new job but won't have ins until Feb 1. He would like RFs until then. Advised I can send him in a 30 day now since he is out and and send mes to dr Brigitte Pulse. OK to give him one more RF? Pended.

## 2016-03-06 MED ORDER — VERAPAMIL HCL ER 240 MG PO CP24: 240.0000 mg | ORAL_CAPSULE | Freq: Every day | ORAL | 0 refills | Status: DC

## 2016-03-06 NOTE — Telephone Encounter (Signed)
Absolutely. Thanks so much for the f/u.

## 2016-06-26 ENCOUNTER — Other Ambulatory Visit: Payer: Self-pay | Admitting: Family Medicine

## 2016-06-26 NOTE — Telephone Encounter (Signed)
Need office visit for additional refills

## 2016-07-22 ENCOUNTER — Other Ambulatory Visit: Payer: Self-pay | Admitting: Family Medicine

## 2016-07-23 NOTE — Telephone Encounter (Signed)
Needs OV within the next 2 wks prior to med refills - we have been given pt warnings about needed OV for the past 6 mos so would really like him to see him . . . Last visit > 1 yr prior. Does need FASTING labs at [place].

## 2016-07-26 ENCOUNTER — Ambulatory Visit (INDEPENDENT_AMBULATORY_CARE_PROVIDER_SITE_OTHER): Payer: BLUE CROSS/BLUE SHIELD | Admitting: Family Medicine

## 2016-07-26 ENCOUNTER — Encounter: Payer: Self-pay | Admitting: Family Medicine

## 2016-07-26 VITALS — BP 154/98 | HR 76 | Temp 97.9°F | Resp 18 | Ht 65.0 in | Wt 277.4 lb

## 2016-07-26 DIAGNOSIS — I10 Essential (primary) hypertension: Secondary | ICD-10-CM

## 2016-07-26 DIAGNOSIS — Z5181 Encounter for therapeutic drug level monitoring: Secondary | ICD-10-CM

## 2016-07-26 DIAGNOSIS — R609 Edema, unspecified: Secondary | ICD-10-CM | POA: Diagnosis not present

## 2016-07-26 MED ORDER — TRIAMTERENE-HCTZ 37.5-25 MG PO TABS: 1.0000 | ORAL_TABLET | Freq: Every day | ORAL | 1 refills | Status: DC

## 2016-07-26 MED ORDER — FUROSEMIDE 40 MG PO TABS: 40.0000 mg | ORAL_TABLET | Freq: Every day | ORAL | 0 refills | Status: DC

## 2016-07-26 MED ORDER — LISINOPRIL 40 MG PO TABS: 40.0000 mg | ORAL_TABLET | Freq: Every day | ORAL | 1 refills | Status: DC

## 2016-07-26 MED ORDER — POTASSIUM CHLORIDE CRYS ER 20 MEQ PO TBCR: 20.0000 meq | EXTENDED_RELEASE_TABLET | Freq: Every day | ORAL | 0 refills | Status: DC

## 2016-07-26 NOTE — Patient Instructions (Addendum)
IF you received an x-ray today, you will receive an invoice from [facility]. Please contact [facility] at [phone] with questions or concerns regarding your invoice.   IF you received labwork today, you will receive an invoice from [facility]. Please contact [facility] at [phone] with questions or concerns regarding your invoice.   Our billing staff will not be able to assist you with questions regarding bills from these companies.  You will be contacted with the lab results as soon as they are available. The fastest way to get your results is to activate your My Chart account. Instructions are located on the last page of this paperwork. If you have not heard from [place] regarding the results in 2 weeks, please contact this office.    Peripheral Edema Peripheral edema is swelling that is caused by a buildup of fluid. Peripheral edema most often affects the lower legs, ankles, and feet. It can also develop in the arms, hands, and face. The area of the body that has peripheral edema will look swollen. It may also feel heavy or warm. Your clothes may start to feel tight. Pressing on the area may make a temporary dent in your skin. You may not be able to move your arm or leg as much as usual. There are many causes of peripheral edema. It can be a complication of other diseases, such as congestive heart failure, kidney disease, or a problem with your blood circulation. It also can be a side effect of certain medicines. It often happens to women during pregnancy. Sometimes, the cause is not known. Treating the underlying condition is often the only treatment for peripheral edema. Follow these instructions at home: Pay attention to any changes in your symptoms. Take these actions to help with your discomfort:  Raise (elevate) your legs while you are sitting or lying down.  Move around often to prevent stiffness and to lessen swelling. Do not sit or stand for long periods of  time.  Wear support stockings as told by your health care provider.  Follow instructions from your health care provider about limiting salt (sodium) in your diet. Sometimes eating less salt can reduce swelling.  Take over-the-counter and prescription medicines only as told by your health care provider. Your health care provider may prescribe medicine to help your body get rid of excess water (diuretic).  Keep all follow-up visits as told by your health care provider. This is important. Contact a health care provider if:  You have a fever.  Your edema starts suddenly or is getting worse, especially if you are pregnant or have a medical condition.  You have swelling in only one leg.  You have increased swelling and pain in your legs. Get help right away if:  You develop shortness of breath, especially when you are lying down.  You have pain in your chest or abdomen.  You feel weak.  You faint. This information is not intended to replace advice given to you by your health care provider. Make sure you discuss any questions you have with your health care provider. Document Released: 04/08/2004 Document Revised: 08/04/2015 Document Reviewed: 09/11/2014 Elsevier Interactive Patient Education  2017 [employer].   Managing Your Hypertension Hypertension is commonly called high blood pressure. This is when the force of your blood pressing against the walls of your arteries is too strong. Arteries are blood vessels that carry blood from your heart throughout your body. Hypertension forces the heart to work harder to pump blood,  and may cause the arteries to become narrow or stiff. Having untreated or uncontrolled hypertension can cause heart attack, stroke, kidney disease, and other problems. What are blood pressure readings? A blood pressure reading consists of a higher number over a lower number. Ideally, your blood pressure should be below 120/80. The first ("top") number is called the  systolic pressure. It is a measure of the pressure in your arteries as your heart beats. The second ("bottom") number is called the diastolic pressure. It is a measure of the pressure in your arteries as the heart relaxes. What does my blood pressure reading mean? Blood pressure is classified into four stages. Based on your blood pressure reading, your health care provider may use the following stages to determine what type of treatment you need, if any. Systolic pressure and diastolic pressure are measured in a unit called mm Hg. Normal   Systolic pressure: below 833.  Diastolic pressure: below 80. Elevated   Systolic pressure: 825-053.  Diastolic pressure: below 80. Hypertension stage 1     Diastolic pressure: 97-67. Hypertension stage 2   Systolic pressure: 341 or above.  Diastolic pressure: 90 or above. What health risks are associated with hypertension? Managing your hypertension is an important responsibility. Uncontrolled hypertension can lead to:  A heart attack.  A stroke.  A weakened blood vessel (aneurysm).  Heart failure.  Kidney damage.  Eye damage.  Metabolic syndrome.  Memory and concentration problems. What changes can I make to manage my hypertension? Eating and drinking   Eat a diet that is high in fiber and potassium, and low in salt (sodium), added sugar, and fat. An example eating plan is called the DASH (Dietary Approaches to Stop Hypertension) diet. To eat this way:  Eat plenty of fresh fruits and vegetables. Try to fill half of your plate at each meal with fruits and vegetables.  Eat whole grains, such as whole wheat pasta, brown rice, or whole grain bread. Fill about one quarter of your plate with whole grains.  Eat low-fat diary products.  Avoid fatty cuts of meat, processed or cured meats, and poultry with skin. Fill about one quarter of your plate with lean proteins such as fish, chicken without skin, beans, eggs, and tofu.  Avoid  premade and processed foods. These tend to be higher in sodium, added sugar, and fat.     Lifestyle   Work with your health care provider to maintain a healthy body weight, or to lose weight. Ask what an ideal weight is for you.  Get at least 30 minutes of exercise that causes your heart to beat faster (aerobic exercise) most days of the week. Activities may include walking, swimming, or biking.       Monitoring   Monitor your blood pressure at home as told by your health care provider. Your personal target blood pressure may vary depending on your medical conditions, your age, and other factors.  Have your blood pressure checked regularly, as often as told by your health care provider. Working with your health care provider   Review all the medicines you take with your health care provider because there may be side effects or interactions.  Talk with your health care provider about your diet, exercise habits, and other lifestyle factors that may be contributing to hypertension.  Visit your health care provider regularly. Your health care provider can help you create and adjust your plan for managing hypertension. Will I need medicine to control my blood pressure? Your health  care provider may prescribe medicine if lifestyle changes are not enough to get your blood pressure under control, and if:  Your systolic blood pressure is 130 or higher.  Your diastolic blood pressure is 80 or higher. Take medicines only as told by your health care provider. Follow the directions carefully. Blood pressure medicines must be taken as prescribed. The medicine does not work as well when you skip doses. Skipping doses also puts you at risk for problems. Contact a health care provider if:  You think you are having a reaction to medicines you have taken.  You have repeated (recurrent) headaches.  You feel dizzy.  You have swelling in your ankles.  You have trouble with your vision. Get  help right away if:  You develop a severe headache or confusion.  You have unusual weakness or numbness, or you feel faint.  You have severe pain in your chest or abdomen.  You vomit repeatedly.  You have trouble breathing. Summary  Hypertension is when the force of blood pumping through your arteries is too strong. If this condition is not controlled, it may put you at risk for serious complications.  Your personal target blood pressure may vary depending on your medical conditions, your age, and other factors. For most people, a normal blood pressure is less than 120/80.  Hypertension is managed by lifestyle changes, medicines, or both. Lifestyle changes include weight loss, eating a healthy, low-sodium diet, exercising more, and limiting alcohol. This information is not intended to replace advice given to you by your health care provider. Make sure you discuss any questions you have with your health care provider. Document Released: 11/24/2011 Document Revised: 01/28/2016 Document Reviewed: 01/28/2016 Elsevier Interactive Patient Education  2017 [employer].

## 2016-07-26 NOTE — Progress Notes (Signed)
Subjective:    Patient ID: Philip Conley, male    DOB: 1970/04/28, 46 y.o.   MRN: [id number] Chief Complaint  Patient presents with  . Labs Only  . Medication Refill    Lasix, verapamil    HPI  Philip Conley is a 46 yo male here for a f/u on his chronic medical conditions and med refills.  I last saw him >1 yr prior for several acute concerns.   HTN: well controlled on verapamil 240mg  qd and maxzide. Does not check BP outside the office.  HLD: Last lipid panel 01/2012 LDL 142, non-HDL 168 - ASCVD risk at that time was 5._% so not on statin.  Peripheral edema: maxzide for edema but doesn't help at all - has to take 2-3 of them to get any relief from the swelling. No calf pain with exercise. No sig improvement with leg elevation o/n. Compression socks worked for several days but then legs started bruise. Drinks a ton of water 4-6 bottles/d.   Lasix 20 we tried for sev wks after last visit did not help at all.  Cervical DDD: seen by Dr. Patrice Paradise prior. Migraines: prn excedrin migraine  Elev LFTS: Suspect fatty liver - EtOH? APAP?  Past Medical History:  Diagnosis Date  . Allergy    Past Surgical History:  Procedure Laterality Date  . HAND SURGERY     Growth removal    Current Outpatient Prescriptions on File Prior to Visit  Medication Sig Dispense Refill  . Aspirin-Acetaminophen-Caffeine (EXCEDRIN MIGRAINE PO) Take by mouth as needed. Reported on 07/05/2015    . mupirocin ointment (BACTROBAN) 2 % Apply 1 application topically 2 (two) times daily. 30 g 1  . verapamil (VERELAN PM) 240 MG 24 hr capsule Take 1 capsule (240 mg total) by mouth at bedtime. Need office visit ON medication prior to additional refills 15 capsule 0   No current facility-administered medications on file prior to visit.    No Known Allergies Family History  Problem Relation Age of Onset  . Heart disease Mother        mumur  . Lupus Father   . Heart disease Maternal Grandmother   . Lung disease Maternal  Grandfather   . Heart disease Paternal Grandmother   . Stroke Paternal Grandfather    Social History   Social History  . Marital status: Married    Spouse name: N/A  . Number of children: N/A  . Years of education: N/A   Social History Main Topics  . Smoking status: Never Smoker  . Smokeless tobacco: Never Used  . Alcohol use None  . Drug use: Unknown  . Sexual activity: Yes    Birth control/ protection: None     Comment: number of sex partners in the last 57 months  1   Other Topics Concern  . None   Social History Narrative  . None   Depression screen [facility] 2/9 07/26/2016 07/05/2015 05/22/2015 04/07/2015  Decreased Interest 0 0 0 0  Down, Depressed, Hopeless 0 0 0 0  PHQ - 2 Score 0 0 0 0     Review of Systems  Constitutional: Negative for activity change, appetite change, chills and fever.  HENT: Negative for facial swelling.   Eyes: Negative for visual disturbance.  Respiratory: Negative for cough and shortness of breath.   Cardiovascular: Positive for leg swelling. Negative for chest pain and palpitations.  Neurological: Negative for dizziness, syncope, facial asymmetry, weakness, light-headedness and headaches.  Objective:   Physical Exam  Constitutional: He is oriented to person, place, and time. He appears well-developed and well-nourished. No distress.  HENT:  Head: Normocephalic and atraumatic.  Eyes: Conjunctivae are normal. Pupils are equal, round, and reactive to light. No scleral icterus.  Neck: Normal range of motion. Neck supple. No thyromegaly present.  Cardiovascular: Normal rate, regular rhythm, normal heart sounds and intact distal pulses.   2+ pitting edema B.  Pulmonary/Chest: Effort normal and breath sounds normal. No respiratory distress.  Musculoskeletal: He exhibits no edema.  Lymphadenopathy:    He has no cervical adenopathy.  Neurological: He is alert and oriented to person, place, and time.  Skin: Skin is warm and dry. He is not  diaphoretic.  Psychiatric: He has a normal mood and affect. His behavior is normal.      BP (!) 154/98   Pulse 76   Temp 97.9 F (36.6 C) (Oral)   Resp 18   Ht 5\' 5"  (1.651 m)   Wt 277 lb 6.4 oz (125.8 kg)   SpO2 96%   BMI 46.16 kg/m  Recheck BP 134/88    Assessment & Plan:  Last CPE 04/2014 - sched CPE next yr with fasting labs. Lipids, cmp - is fasting today  1. Essential hypertension   2. Peripheral edema   3. Medication monitoring encounter    I suspect his severe edema unresponsive to compression socks, lasix 20, maxzide is due to the verapamil. D/c verapamil. Try lisinopril 40 instead.  Cont Maxzide. Use lasix 40 with K 20 to mobilize the fluid - can stop once diuresed. Drink plenty of water. Recheck in 3 wks.  Orders Placed This Encounter  Procedures  . Comprehensive metabolic panel    Order Specific Question:   Has the patient fasted?    Answer:   Yes  . Lipid panel    Order Specific Question:   Has the patient fasted?    Answer:   Yes    Meds ordered this encounter  Medications  . lisinopril (PRINIVIL,ZESTRIL) 40 MG tablet    Sig: Take 1 tablet (40 mg total) by mouth daily.    Dispense:  30 tablet    Refill:  1  . furosemide (LASIX) 40 MG tablet    Sig: Take 1 tablet (40 mg total) by mouth daily.    Dispense:  30 tablet    Refill:  0  . potassium chloride SA (K-DUR,KLOR-CON) 20 MEQ tablet    Sig: Take 1 tablet (20 mEq total) by mouth daily.    Dispense:  30 tablet    Refill:  0  . triamterene-hydrochlorothiazide (MAXZIDE-25) 37.5-25 MG tablet    Sig: Take 1 tablet by mouth daily.    Dispense:  90 tablet    Refill:  1     [name], M.D.  Primary Care at [facility] [address] [place],  [address] [phone] phone [phone] fax  08/01/16 11:01 PM

## 2016-07-27 LAB — COMPREHENSIVE METABOLIC PANEL
ALT: 39 IU/L (ref 0–44)
AST: 28 IU/L (ref 0–40)
Albumin/Globulin Ratio: 1.6 (ref 1.2–2.2)
Albumin: 4.4 g/dL (ref 3.5–5.5)
Alkaline Phosphatase: 101 IU/L (ref 39–117)
BUN/Creatinine Ratio: 11 (ref 9–20)
BUN: 12 mg/dL (ref 6–24)
Bilirubin Total: <0.2 mg/dL (ref 0.0–1.2)
CO2: 22 mmol/L (ref 18–29)
Calcium: 9.6 mg/dL (ref 8.7–10.2)
Chloride: 99 mmol/L (ref 96–106)
Creatinine, Ser: 1.05 mg/dL (ref 0.76–1.27)
GFR calc Af Amer: 97 mL/min/{1.73_m2} (ref >59)
GFR calc non Af Amer: 84 mL/min/{1.73_m2} (ref >59)
Globulin, Total: 2.8 g/dL (ref 1.5–4.5)
Glucose: 79 mg/dL (ref 65–99)
Potassium: 4.4 mmol/L (ref 3.5–5.2)
Sodium: 141 mmol/L (ref 134–144)
Total Protein: 7.2 g/dL (ref 6.0–8.5)

## 2016-07-27 LAB — LIPID PANEL
Chol/HDL Ratio: 5.3 ratio — ABNORMAL HIGH (ref 0.0–5.0)
Cholesterol, Total: 195 mg/dL (ref 100–199)
HDL: 37 mg/dL — ABNORMAL LOW (ref >39)
LDL Calculated: 115 mg/dL — ABNORMAL HIGH (ref 0–99)
Triglycerides: 214 mg/dL — ABNORMAL HIGH (ref 0–149)
VLDL Cholesterol Cal: 43 mg/dL — ABNORMAL HIGH (ref 5–40)

## 2016-08-10 ENCOUNTER — Telehealth: Payer: Self-pay | Admitting: Family Medicine

## 2016-08-10 MED ORDER — CLOBETASOL PROPIONATE 0.05 % EX GEL: 1.0000 "application " | Freq: Two times a day (BID) | CUTANEOUS | 0 refills | Status: DC

## 2016-08-10 NOTE — Telephone Encounter (Signed)
Pt advised of shaws note

## 2016-08-10 NOTE — Telephone Encounter (Signed)
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

## 2016-08-10 NOTE — Telephone Encounter (Signed)
Are you willing to rx?

## 2016-08-10 NOTE — Telephone Encounter (Addendum)
No - zpack is an antibiotic and would not be very effective against most types of bacterial skin infections and would not treat poison oak at all. Poison oak causes a severe allergic skin reaction which is treated with steroids - an immunosuppressant. If he happened to have a secondary skin infection from the open wounds/blisters from poison oak, a systemic steroid (such as prednisone or "medrol") could be life-threatening.  Will send in a highly potent topical steroid. If worsening, stop immediately as would make a secondary infection worse. Stop immed when rash clears and don't use on unaffected skin as prolonged use or overuse will result in thinned hypopigmented skin.

## 2016-08-11 MED ORDER — CLOBETASOL PROPIONATE 0.05 % EX OINT: 1.0000 "application " | TOPICAL_OINTMENT | Freq: Two times a day (BID) | CUTANEOUS | 0 refills | Status: DC

## 2016-08-11 NOTE — Telephone Encounter (Signed)
Fine to switch to either - sent in ointment. Also, lots of other available highly potent topical steroids would work other than  clobetasol so no need to do PA.

## 2016-08-11 NOTE — Telephone Encounter (Signed)
Clobetasol gel not covered, started PA Looks like cream or ointment would be covered

## 2016-08-19 ENCOUNTER — Telehealth: Payer: Self-pay | Admitting: Family Medicine

## 2016-08-20 NOTE — Telephone Encounter (Signed)
Perfect. Thanks so much. Refill sent in.

## 2016-08-20 NOTE — Telephone Encounter (Signed)
How is his edema?  Is he responding to the lasix this time?  Taking it daily or as needed?  How are his blood pressures?

## 2016-08-20 NOTE — Telephone Encounter (Signed)
Edema is much less, using lasix q day  No concerns   131/87 recent b/p  09/06/16 f/u appt

## 2016-09-06 ENCOUNTER — Ambulatory Visit (INDEPENDENT_AMBULATORY_CARE_PROVIDER_SITE_OTHER): Payer: BLUE CROSS/BLUE SHIELD | Admitting: Family Medicine

## 2016-09-06 ENCOUNTER — Encounter: Payer: Self-pay | Admitting: Family Medicine

## 2016-09-06 VITALS — BP 144/84 | HR 83 | Temp 97.9°F | Resp 16 | Ht 65.0 in | Wt 278.6 lb

## 2016-09-06 DIAGNOSIS — I10 Essential (primary) hypertension: Secondary | ICD-10-CM | POA: Diagnosis not present

## 2016-09-06 DIAGNOSIS — R600 Localized edema: Secondary | ICD-10-CM

## 2016-09-06 DIAGNOSIS — Z5181 Encounter for therapeutic drug level monitoring: Secondary | ICD-10-CM | POA: Diagnosis not present

## 2016-09-06 DIAGNOSIS — E785 Hyperlipidemia, unspecified: Secondary | ICD-10-CM

## 2016-09-06 MED ORDER — MUPIROCIN 2 % EX OINT: 1.0000 "application " | TOPICAL_OINTMENT | Freq: Two times a day (BID) | CUTANEOUS | 1 refills | Status: DC

## 2016-09-06 MED ORDER — FUROSEMIDE 40 MG PO TABS: ORAL_TABLET | ORAL | 1 refills | Status: DC

## 2016-09-06 MED ORDER — PREDNISONE 20 MG PO TABS: ORAL_TABLET | ORAL | 0 refills | Status: DC

## 2016-09-06 MED ORDER — POTASSIUM CHLORIDE CRYS ER 20 MEQ PO TBCR: 20.0000 meq | EXTENDED_RELEASE_TABLET | Freq: Two times a day (BID) | ORAL | 1 refills | Status: DC

## 2016-09-06 MED ORDER — CYCLOBENZAPRINE HCL 10 MG PO TABS: 10.0000 mg | ORAL_TABLET | Freq: Three times a day (TID) | ORAL | 1 refills | Status: DC | PRN

## 2016-09-06 NOTE — Progress Notes (Signed)
Subjective:    Patient ID: Philip Conley, male    DOB: 09-10-1970, 46 y.o.   MRN: [id number]  Chief Complaint  Patient presents with  . Follow-up    BP meds, pt " feels med is working, BP are still elevated"  . Headache    thinks its related to the heat  . pain in right shoulders    x 2 wks    HPI  Philip Conley delightful 46 year old male who I last saw in the office 6 weeks prior for high blood pressure. He had been having a significant amount of lower extremity edema had been unresponsive to compression socks, lasix 20, maxzide is due to the verapamil. D/c verapamil. Try lisinopril 40 instead.  Cont Maxzide. Use lasix 40 with K 20 to mobilize the fluid - can stop once diuresed. Drink plenty of water. Recheck in 3 wks. He called in 2 weeks ago stating that his edema had significantly decreased staining with still using the Lasix daily. Blood pressure outside the office was 131/87. Did down to 129 once  Lipid panel had improved significantly from when last checked 4 years prior. ASCVD risk is 4.2% with a lifetime risk of 50% and optimal risk of 1.2%  Went to the beach this weekend and swell up In the right shoulder pain due to 3,4,5 flair causing bad pain/numbness in right arm and fingers hurt - feels like funny bone pain. More pain in scapula and clavicle.  Take 3 tylenol arthritis for it prn but tries not to do it to much.  HA this morning to excedrin migraine and 2 asa. Worst HA he has had since he started the new med. 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   occ severe back pain that will last for a few minutes Right foot first thing in the morning gets up towalk - h/o plantar fasciitis. Has to wear steel toes at work.  Last course of pred 18 mos prior    hE DID not tol gabapentin at all - "flet like a bag of mushrooms"  Past Medical History:  Diagnosis Date  . Allergy     Past Surgical History:  Procedure Laterality Date  . HAND SURGERY     Growth removal    Current Outpatient Prescriptions on File Prior to Visit  Medication Sig Dispense Refill  . Aspirin-Acetaminophen-Caffeine (EXCEDRIN MIGRAINE PO) Take by mouth as needed. Reported on 07/05/2015    . clobetasol ointment (TEMOVATE) 9.76 % Apply 1 application topically 2 (two) times daily. To poison ivy/oak, allergy rash 60 g 0  . furosemide (LASIX) 40 MG tablet TAKE 1 TABLET(40 MG) BY MOUTH DAILY 30 tablet 0  . lisinopril (PRINIVIL,ZESTRIL) 40 MG tablet Take 1 tablet (40 mg total) by mouth daily. 30 tablet 1  . mupirocin ointment (BACTROBAN) 2 % Apply 1 application topically 2 (two) times daily. 30 g 1  . potassium chloride SA (K-DUR,KLOR-CON) 20 MEQ tablet TAKE 1 TABLET(20 MEQ) BY MOUTH DAILY 30 tablet 0  . triamterene-hydrochlorothiazide (MAXZIDE-25) 37.5-25 MG tablet Take 1 tablet by mouth daily. 90 tablet 1  . verapamil (VERELAN PM) 240 MG 24 hr capsule Take 1 capsule (240 mg total) by mouth at bedtime. Need office visit ON medication prior to additional refills 15 capsule 0   No current facility-administered medications on file prior to visit.    No  Known Allergies Family History  Problem Relation Age of Onset  . Heart disease Mother        mumur  . Lupus Father   . Heart disease Maternal Grandmother   . Lung disease Maternal Grandfather   . Heart disease Paternal Grandmother   . Stroke Paternal Grandfather    Social History   Social History  . Marital status: Married    Spouse name: N/A  . Number of children: N/A  . Years of education: N/A   Social History Main Topics  . Smoking status: Never Smoker  . Smokeless tobacco: Never Used  . Alcohol use None  . Drug use: Unknown  . Sexual activity: Yes    Birth control/ protection: None     Comment: number of sex partners in the last 60 months  1   Other Topics Concern  . None   Social History Narrative  . None   Depression  screen [facility] 2/9 09/06/2016 07/26/2016 07/05/2015 05/22/2015 04/07/2015  Decreased Interest 0 0 0 0 0  Down, Depressed, Hopeless 0 0 0 0 0  PHQ - 2 Score 0 0 0 0 0     Review of Systems See hpi    Objective:   Physical Exam  Constitutional: He is oriented to person, place, and time. He appears well-developed and well-nourished. No distress.  HENT:  Head: Normocephalic and atraumatic.  Eyes: Conjunctivae are normal. Pupils are equal, round, and reactive to light. No scleral icterus.  Neck: Normal range of motion. Neck supple. No thyromegaly present.  Cardiovascular: Normal rate, regular rhythm, normal heart sounds and intact distal pulses.   Pulmonary/Chest: Effort normal and breath sounds normal. No respiratory distress.  Musculoskeletal: He exhibits no edema.  Lymphadenopathy:    He has no cervical adenopathy.  Neurological: He is alert and oriented to person, place, and time.  Skin: Skin is warm and dry. He is not diaphoretic.  Psychiatric: He has a normal mood and affect. His behavior is normal.         BP (!) 147/97   Pulse 83   Temp 97.9 F (36.6 C) (Oral)   Resp 16   Ht 5\' 5"  (1.651 m)   Wt 278 lb 9.6 oz (126.4 kg)   SpO2 97%   BMI 46.36 kg/m   Assessment & Plan:  Start asa 81mg  Despite improvement in the edema his weight is actually up 1 pound since his last visit. Try heal cups.  Check bp outside office.   1. Essential hypertension   2. Hyperlipidemia with target low density lipoprotein (LDL) cholesterol less than 130 mg/dL   3. Medication monitoring encounter   4. Pedal edema     Orders Placed This Encounter  Procedures  . Basic metabolic panel    Order Specific Question:   Has the patient fasted?    Answer:   No  . EKG 12-Lead    Meds ordered this encounter  Medications  . predniSONE (DELTASONE) 20 MG tablet    Sig: Take 3 tabs qd x 3d, then 2 tabs qd x 3d then 1 tab qd x 3d.    Dispense:  18 tablet    Refill:  0  . cyclobenzaprine (FLEXERIL) 10 MG  tablet    Sig: Take 1 tablet (10 mg total) by mouth 3 (three) times daily as needed for muscle spasms.    Dispense:  30 tablet    Refill:  1  . furosemide (LASIX) 40 MG tablet    Sig:  TAKE 1 TABLET(40 MG) BY MOUTH twice daily as directed by physician    Dispense:  180 tablet    Refill:  1  . potassium chloride SA (K-DUR,KLOR-CON) 20 MEQ tablet    Sig: Take 1 tablet (20 mEq total) by mouth 2 (two) times daily.    Dispense:  180 tablet    Refill:  1  . mupirocin ointment (BACTROBAN) 2 %    Sig: Apply 1 application topically 2 (two) times daily.    Dispense:  30 g    Refill:  1    Philip Conley, M.D.  Primary Care at [facility] [address] [place], [place] [address] [phone] phone [phone] fax  09/08/16 10:50 PM

## 2016-09-06 NOTE — Patient Instructions (Addendum)
IF you received an x-ray today, you will receive an invoice from [facility]. Please contact [facility] at [phone] with questions or concerns regarding your invoice.   IF you received labwork today, you will receive an invoice from [place]. Please contact [facility] at [phone] with questions or concerns regarding your invoice.   Our billing staff will not be able to assist you with questions regarding bills from these companies.  You will be contacted with the lab results as soon as they are available. The fastest way to get your results is to activate your My Chart account. Instructions are located on the last page of this paperwork. If you have not heard from [place] regarding the results in 2 weeks, please contact this office.     Managing Your Hypertension Hypertension is commonly called high blood pressure. This is when the force of your blood pressing against the walls of your arteries is too strong. Arteries are blood vessels that carry blood from your heart throughout your body. Hypertension forces the heart to work harder to pump blood, and may cause the arteries to become narrow or stiff. Having untreated or uncontrolled hypertension can cause heart attack, stroke, kidney disease, and other problems. What are blood pressure readings? A blood pressure reading consists of a higher number over a lower number. Ideally, your blood pressure should be below 120/80. The first ("top") number is called the systolic pressure. It is a measure of the pressure in your arteries as your heart beats. The second ("bottom") number is called the diastolic pressure. It is a measure of the pressure in your arteries as the heart relaxes. What does my blood pressure reading mean? Blood pressure is classified into four stages. Based on your blood pressure reading, your health care provider may use the following stages to determine what type of treatment you need, if any. Systolic  pressure and diastolic pressure are measured in a unit called mm Hg. Normal  Systolic pressure: below 196.  Diastolic pressure: below 80. Elevated  Systolic pressure: 222-979.  Diastolic pressure: below 80. Hypertension stage 1  Systolic pressure: 892-119.  Diastolic pressure: 41-74. Hypertension stage 2  Systolic pressure: 081 or above.  Diastolic pressure: 90 or above. What health risks are associated with hypertension? Managing your hypertension is an important responsibility. Uncontrolled hypertension can lead to:  A heart attack.  A stroke.  A weakened blood vessel (aneurysm).  Heart failure.  Kidney damage.  Eye damage.  Metabolic syndrome.  Memory and concentration problems.  What changes can I make to manage my hypertension? Hypertension can be managed by making lifestyle changes and possibly by taking medicines. Your health care provider will help you make a plan to bring your blood pressure within a normal range. Eating and drinking  Eat a diet that is high in fiber and potassium, and low in salt (sodium), added sugar, and fat. An example eating plan is called the DASH (Dietary Approaches to Stop Hypertension) diet. To eat this way: ? Eat plenty of fresh fruits and vegetables. Try to fill half of your plate at each meal with fruits and vegetables. ? Eat whole grains, such as whole wheat pasta, brown rice, or whole grain bread. Fill about one quarter of your plate with whole grains. ? Eat low-fat diary products. ? Avoid fatty cuts of meat, processed or cured meats, and poultry with skin. Fill about one quarter of your plate with lean proteins such as fish, chicken without skin, beans, eggs, and  tofu. ? Avoid premade and processed foods. These tend to be higher in sodium, added sugar, and fat.  Reduce your daily sodium intake. Most people with hypertension should eat less than 1,500 mg of sodium a day.  Limit alcohol intake to no more than 1 drink a day  for nonpregnant women and 2 drinks a day for men. One drink equals 12 oz of beer, 5 oz of wine, or 1 oz of hard liquor. Lifestyle  Work with your health care provider to maintain a healthy body weight, or to lose weight. Ask what an ideal weight is for you.  Get at least 30 minutes of exercise that causes your heart to beat faster (aerobic exercise) most days of the week. Activities may include walking, swimming, or biking.  Include exercise to strengthen your muscles (resistance exercise), such as weight lifting, as part of your weekly exercise routine. Try to do these types of exercises for 30 minutes at least 3 days a week.  Do not use any products that contain nicotine or tobacco, such as cigarettes and e-cigarettes. If you need help quitting, ask your health care provider.  Control any long-term (chronic) conditions you have, such as high cholesterol or diabetes. Monitoring  Monitor your blood pressure at home as told by your health care provider. Your personal target blood pressure may vary depending on your medical conditions, your age, and other factors.  Have your blood pressure checked regularly, as often as told by your health care provider. Working with your health care provider  Review all the medicines you take with your health care provider because there may be side effects or interactions.  Talk with your health care provider about your diet, exercise habits, and other lifestyle factors that may be contributing to hypertension.  Visit your health care provider regularly. Your health care provider can help you create and adjust your plan for managing hypertension. Will I need medicine to control my blood pressure? Your health care provider may prescribe medicine if lifestyle changes are not enough to get your blood pressure under control, and if:  Your systolic blood pressure is 130 or higher.  Your diastolic blood pressure is 80 or higher.  Take medicines only as told  by your health care provider. Follow the directions carefully. Blood pressure medicines must be taken as prescribed. The medicine does not work as well when you skip doses. Skipping doses also puts you at risk for problems. Contact a health care provider if:  You think you are having a reaction to medicines you have taken.  You have repeated (recurrent) headaches.  You feel dizzy.  You have swelling in your ankles.  You have trouble with your vision. Get help right away if:  You develop a severe headache or confusion.  You have unusual weakness or numbness, or you feel faint.  You have severe pain in your chest or abdomen.  You vomit repeatedly.  You have trouble breathing. Summary  Hypertension is when the force of blood pumping through your arteries is too strong. If this condition is not controlled, it may put you at risk for serious complications.  Your personal target blood pressure may vary depending on your medical conditions, your age, and other factors. For most people, a normal blood pressure is less than 120/80.  Hypertension is managed by lifestyle changes, medicines, or both. Lifestyle changes include weight loss, eating a healthy, low-sodium diet, exercising more, and limiting alcohol. This information is not intended to replace advice given  to you by your health care provider. Make sure you discuss any questions you have with your health care provider. Document Released: 11/24/2011 Document Revised: 01/28/2016 Document Reviewed: 01/28/2016 Elsevier Interactive Patient Education  2018 [place].  Plantar Fasciitis Rehab Ask your health care provider which exercises are safe for you. Do exercises exactly as told by your health care provider and adjust them as directed. It is normal to feel mild stretching, pulling, tightness, or discomfort as you do these exercises, but you should stop right away if you feel sudden pain or your pain gets worse. Do not begin these  exercises until told by your health care provider. Stretching and range of motion exercises These exercises warm up your muscles and joints and improve the movement and flexibility of your foot. These exercises also help to relieve pain. Exercise A: Plantar fascia stretch  Sit with your left / right leg crossed over your opposite knee. Hold your heel with one hand with that thumb near your arch. With your other hand, hold your toes and gently pull them back toward the top of your foot. You should feel a stretch on the bottom of your toes or your foot or both. Hold this stretch for__________ seconds. Slowly release your toes and return to the starting position. Repeat __________ times. Complete this exercise __________ times a day. Exercise B: Gastroc, standing  Stand with your hands against a wall. Extend your left / right leg behind you, and bend your front knee slightly. Keeping your heels on the floor and keeping your back knee straight, shift your weight toward the wall without arching your back. You should feel a gentle stretch in your left / right calf. Hold this position for __________ seconds. Repeat __________ times. Complete this exercise __________ times a day. Exercise C: Soleus, standing Stand with your hands against a wall. Extend your left / right leg behind you, and bend your front knee slightly. Keeping your heels on the floor, bend your back knee and slightly shift your weight over the back leg. You should feel a gentle stretch deep in your calf. Hold this position for __________ seconds. Repeat __________ times. Complete this exercise __________ times a day. Exercise D: Gastrocsoleus, standing Stand with the ball of your left / right foot on a step. The ball of your foot is on the walking surface, right under your toes. Keep your other foot firmly on the same step. Hold onto the wall or a railing for balance. Slowly lift your other foot, allowing your body weight to  press your heel down over the edge of the step. You should feel a stretch in your left / right calf. Hold this position for __________ seconds. Return both feet to the step. Repeat this exercise with a slight bend in your left / right knee. Repeat __________ times with your left / right knee straight and __________ times with your left / right knee bent. Complete this exercise __________ times a day. Balance exercise This exercise builds your balance and strength control of your arch to help take pressure off your plantar fascia. Exercise E: Single leg stand Without shoes, stand near a railing or in a doorway. You may hold onto the railing or door frame as needed. Stand on your left / right foot. Keep your big toe down on the floor and try to keep your arch lifted. Do not let your foot roll inward. Hold this position for __________ seconds. If this exercise is too easy, you can  try it with your eyes closed or while standing on a pillow. Repeat __________ times. Complete this exercise __________ times a day. This information is not intended to replace advice given to you by your health care provider. Make sure you discuss any questions you have with your health care provider. Document Released: 03/01/2005 Document Revised: 11/04/2015 Document Reviewed: 01/13/2015 Elsevier Interactive Patient Education  2018 [place].  Plantar Fasciitis Plantar fasciitis is a painful foot condition that affects the heel. It occurs when the band of tissue that connects the toes to the heel bone (plantar fascia) becomes irritated. This can happen after exercising too much or doing other repetitive activities (overuse injury). The pain from plantar fasciitis can range from mild irritation to severe pain that makes it difficult for you to walk or move. The pain is usually worse in the morning or after you have been sitting or lying down for a while. What are the causes? This condition may be caused by:  Standing  for long periods of time.  Wearing shoes that do not fit.  Doing high-impact activities, including running, aerobics, and ballet.  Being overweight.  Having an abnormal way of walking (gait).  Having tight calf muscles.  Having high arches in your feet.  Starting a new athletic activity.  What are the signs or symptoms? The main symptom of this condition is heel pain. Other symptoms include:  Pain that gets worse after activity or exercise.  Pain that is worse in the morning or after resting.  Pain that goes away after you walk for a few minutes.  How is this diagnosed? This condition may be diagnosed based on your signs and symptoms. Your health care provider will also do a physical exam to check for:  A tender area on the bottom of your foot.  A high arch in your foot.  Pain when you move your foot.  Difficulty moving your foot.  You may also need to have imaging studies to confirm the diagnosis. These can include:  X-rays.  Ultrasound.  MRI.  How is this treated? Treatment for plantar fasciitis depends on the severity of the condition. Your treatment may include:  Rest, ice, and over-the-counter pain medicines to manage your pain.  Exercises to stretch your calves and your plantar fascia.  A splint that holds your foot in a stretched, upward position while you sleep (night splint).  Physical therapy to relieve symptoms and prevent problems in the future.  Cortisone injections to relieve severe pain.  Extracorporeal shock wave therapy (ESWT) to stimulate damaged plantar fascia with electrical impulses. It is often used as a last resort before surgery.  Surgery, if other treatments have not worked after 12 months.  Follow these instructions at home:  Take medicines only as directed by your health care provider.  Avoid activities that cause pain.  Roll the bottom of your foot over a bag of ice or a bottle of cold water. Do this for 20 minutes, 3-4  times a day.  Perform simple stretches as directed by your health care provider.  Try wearing athletic shoes with air-sole or gel-sole cushions or soft shoe inserts.  Wear a night splint while sleeping, if directed by your health care provider.  Keep all follow-up appointments with your health care provider. How is this prevented?  Do not perform exercises or activities that cause heel pain.  Consider finding low-impact activities if you continue to have problems.  Lose weight if you need to. The best way  to prevent plantar fasciitis is to avoid the activities that aggravate your plantar fascia. Contact a health care provider if:  Your symptoms do not go away after treatment with home care measures.  Your pain gets worse.  Your pain affects your ability to move or do your daily activities. This information is not intended to replace advice given to you by your health care provider. Make sure you discuss any questions you have with your health care provider. Document Released: 11/24/2000 Document Revised: 08/04/2015 Document Reviewed: 01/09/2014 Elsevier Interactive Patient Education  [employer].

## 2016-09-07 LAB — BASIC METABOLIC PANEL
BUN/Creatinine Ratio: 9 (ref 9–20)
BUN: 11 mg/dL (ref 6–24)
CO2: 22 mmol/L (ref 20–29)
Calcium: 8.8 mg/dL (ref 8.7–10.2)
Chloride: 103 mmol/L (ref 96–106)
Creatinine, Ser: 1.16 mg/dL (ref 0.76–1.27)
GFR calc Af Amer: 86 mL/min/{1.73_m2} (ref >59)
GFR calc non Af Amer: 75 mL/min/{1.73_m2} (ref >59)
Glucose: 93 mg/dL (ref 65–99)
Potassium: 3.7 mmol/L (ref 3.5–5.2)
Sodium: 143 mmol/L (ref 134–144)

## 2016-09-16 ENCOUNTER — Other Ambulatory Visit: Payer: Self-pay | Admitting: Family Medicine

## 2016-10-07 ENCOUNTER — Ambulatory Visit: Payer: BLUE CROSS/BLUE SHIELD | Admitting: Family Medicine

## 2016-10-11 ENCOUNTER — Other Ambulatory Visit: Payer: Self-pay | Admitting: Family Medicine

## 2016-11-09 ENCOUNTER — Other Ambulatory Visit: Payer: Self-pay | Admitting: Family Medicine

## 2017-01-09 ENCOUNTER — Other Ambulatory Visit: Payer: Self-pay | Admitting: Family Medicine

## 2017-01-10 ENCOUNTER — Ambulatory Visit: Payer: BLUE CROSS/BLUE SHIELD | Admitting: Family Medicine

## 2017-01-11 ENCOUNTER — Other Ambulatory Visit: Payer: Self-pay | Admitting: Family Medicine

## 2017-01-14 NOTE — Progress Notes (Deleted)
   Subjective:    Patient ID: Philip Conley, male    DOB: 11-Jun-1970, 46 y.o.   MRN: [id number]  HPI  HTN: D/C'd verapamil 240 6 mos prior due to resistant peripheral edema (even w/ lasix, maxzide, compression socks) and started on lisinopril 40 and maxzide with prn lasix 40/K20 bid. Check BP outside the office?  Taking asa 81mg  qd. Cr 1.05 -> 1.16 when started lisinopril.  HLD: Last lipid panel 07/2016 had sig improved from when last checked 4 yrs prior.  LDL 142->115, non-HDL 168->158 - ASCVD risk at that time was 5._% ->4.2% so not on statin.  Cervical DDD: seen by Dr. Patrice Paradise prior. Migraines: prn excedrin migraine   Review of Systems     Objective:   Physical Exam        Assessment & Plan:

## 2017-01-15 ENCOUNTER — Ambulatory Visit (INDEPENDENT_AMBULATORY_CARE_PROVIDER_SITE_OTHER): Payer: BLUE CROSS/BLUE SHIELD | Admitting: Family Medicine

## 2017-01-15 ENCOUNTER — Encounter: Payer: Self-pay | Admitting: Family Medicine

## 2017-01-15 VITALS — BP 132/78 | HR 88 | Temp 97.7°F | Resp 18 | Ht 65.0 in | Wt 244.4 lb

## 2017-01-15 DIAGNOSIS — E785 Hyperlipidemia, unspecified: Secondary | ICD-10-CM | POA: Diagnosis not present

## 2017-01-15 DIAGNOSIS — Z23 Encounter for immunization: Secondary | ICD-10-CM

## 2017-01-15 DIAGNOSIS — G5601 Carpal tunnel syndrome, right upper limb: Secondary | ICD-10-CM

## 2017-01-15 DIAGNOSIS — Z5181 Encounter for therapeutic drug level monitoring: Secondary | ICD-10-CM

## 2017-01-15 DIAGNOSIS — I10 Essential (primary) hypertension: Secondary | ICD-10-CM | POA: Diagnosis not present

## 2017-01-15 DIAGNOSIS — M722 Plantar fascial fibromatosis: Secondary | ICD-10-CM | POA: Diagnosis not present

## 2017-01-15 MED ORDER — OLMESARTAN MEDOXOMIL 40 MG PO TABS: 40.0000 mg | ORAL_TABLET | Freq: Every day | ORAL | 1 refills | Status: DC

## 2017-01-15 MED ORDER — PREDNISONE 20 MG PO TABS: ORAL_TABLET | ORAL | 0 refills | Status: DC

## 2017-01-15 NOTE — Progress Notes (Signed)
Subjective:  By signing my name below, I, Philip Conley, attest that this documentation has been prepared under the direction and in the presence of Philip Cheadle, MD. Electronically Signed: Moises Conley, Tiger. 01/15/2017 , 2:05 PM .  Patient was seen in Room 1 .   Patient ID: Philip Conley, male    DOB: [birth year], 46 y.o.   MRN: [id number] Chief Complaint  Patient presents with  . Hypertension  . Hyperlipidemia  . Follow-up    4 month follow up   HPI  HTN: D/C'd verapamil 240 6 mos prior due to resistant peripheral edema (even w/ lasix, maxzide, compression socks) and started on lisinopril 40 and maxzide with prn lasix 40/K20 bid. Check BP outside the office?  Taking asa 81mg  qd. Cr 1.05 -> 1.16 when started lisinopril.   He's been taking Lasix 40mg  in the morning, and Maxzide in the evening. He also takes potassium twice a day and Lisinopril at night.   He was checking his BP at home; last checked a few weeks ago, ranging 130-134/80-86.   Lower extremity pain: He's still having bilateral foot problems. He notes when he was taking the prednisone, his feet pain and swelling improved; but after 3 weeks, his lower extremity swelling and pain flared again. He's also been having pain near his right hip, located lateral trochanter bursa region. He would hold onto the wall due to pain. He notes improvement with walking, but after sitting down even just 2-3 minutes, the pain returns. He states heel cups in his work boots with improvement, but heel cups in his tennis shoes without relief.   Upper extremity pain: He also complains right shoulder pain when he pushes objects. He also mentions occasional numbness with some weakness in his hands, R>L. He had right hand surgery done previously at [facility]. He is right hand dominant. He mentions trouble gripping a wrench earlier today due to the weakness and numbness.   HLD: Last lipid panel 07/2016 had sig improved from when last checked  4 yrs prior.  LDL 142->115, non-HDL 168->158 - ASCVD risk at that time was 5._% ->4.2% so not on statin.  Cervical DDD: seen by Dr. Patrice Paradise prior. Migraines: prn excedrin migraine  Immunizations: updated flu shot and tetanus today.   Past Medical History:  Diagnosis Date  . Allergy    Past Surgical History:  Procedure Laterality Date  . HAND SURGERY     Growth removal    Current Outpatient Medications on File Prior to Visit  Medication Sig Dispense Refill  . Aspirin-Acetaminophen-Caffeine (EXCEDRIN MIGRAINE PO) Take by mouth as needed. Reported on 07/05/2015    . cyclobenzaprine (FLEXERIL) 10 MG tablet Take 1 tablet (10 mg total) by mouth 3 (three) times daily as needed for muscle spasms. 30 tablet 1  . furosemide (LASIX) 40 MG tablet TAKE 1 TABLET(40 MG) BY MOUTH twice daily as directed by physician 180 tablet 1  . potassium chloride SA (K-DUR,KLOR-CON) 20 MEQ tablet Take 1 tablet (20 mEq total) by mouth 2 (two) times daily. 180 tablet 1  . triamterene-hydrochlorothiazide (MAXZIDE-25) 37.5-25 MG tablet Take 1 tablet by mouth daily. 90 tablet 1   No current facility-administered medications on file prior to visit.    No Known Allergies Family History  Problem Relation Age of Onset  . Heart disease Mother        mumur  . Lupus Father   . Heart disease Maternal Grandmother   . Lung disease Maternal Grandfather   . Heart  disease Paternal Grandmother   . Stroke Paternal Grandfather    Social History   Socioeconomic History  . Marital status: Married    Spouse name: None  . Number of children: None  . Years of education: None  . Highest education level: None  Social Needs  . Financial resource strain: None  . Food insecurity - worry: None  . Food insecurity - inability: None  . Transportation needs - medical: None  . Transportation needs - non-medical: None  Occupational History  . None  Tobacco Use  . Smoking status: Never Smoker  . Smokeless tobacco: Never Used    Substance and Sexual Activity  . Alcohol use: None  . Drug use: None  . Sexual activity: Yes    Birth control/protection: None    Comment: number of sex partners in the last 13 months  1  Other Topics Concern  . None  Social History Narrative  . None   Depression screen [facility] 2/9 01/15/2017 09/06/2016 07/26/2016 07/05/2015 05/22/2015  Decreased Interest 0 0 0 0 0  Down, Depressed, Hopeless 0 0 0 0 0  PHQ - 2 Score 0 0 0 0 0     Review of Systems  Constitutional: Negative for fatigue and unexpected weight change.  Eyes: Negative for visual disturbance.  Respiratory: Negative for cough, chest tightness and shortness of breath.   Cardiovascular: Negative for chest pain, palpitations and leg swelling.  Gastrointestinal: Negative for abdominal pain and Conley in stool.  Musculoskeletal: Positive for arthralgias and myalgias.  Neurological: Positive for weakness and numbness. Negative for dizziness, light-headedness and headaches.       Objective:   Physical Exam  Constitutional: He is oriented to person, place, and time. He appears well-developed and well-nourished. No distress.  HENT:  Head: Normocephalic and atraumatic.  Eyes: Pupils are equal, round, and reactive to light. EOM are normal.  Neck: Neck supple.  Cardiovascular: Normal rate.   Pulmonary/Chest: Effort normal. No respiratory distress.  Musculoskeletal: Normal range of motion.  Positive phalen's and tinel's  Neurological: He is alert and oriented to person, place, and time.  Reflex Scores:      Bicep reflexes are 2+ on the right side and 2+ on the left side.      Brachioradialis reflexes are 2+ on the right side and 2+ on the left side. Skin: Skin is warm and dry.  Psychiatric: He has a normal mood and affect. His behavior is normal.  Nursing note and vitals reviewed.   BP 132/78 (BP Location: Left Arm, Patient Position: Sitting, Cuff Size: Large)   Pulse 88   Temp 97.7 F (36.5 C) (Oral)   Resp 18   Ht 5\' 5"   (1.651 m)   Wt 244 lb 6.4 oz (110.9 kg)   SpO2 97%   BMI 40.67 kg/m      Assessment & Plan:   1. Essential hypertension   2. Hyperlipidemia with target low density lipoprotein (LDL) cholesterol less than 130 mg/dL   3. Medication monitoring encounter   4. Plantar fasciitis of right foot   5. Right carpal tunnel syndrome     Orders Placed This Encounter  Procedures  . Tdap vaccine greater than or equal to 7yo IM  . Basic metabolic panel    Order Specific Question:   Has the patient fasted?    Answer:   No  . Ambulatory referral to Podiatry    Referral Priority:   Routine    Referral Type:   Consultation  Referral Reason:   Specialty Services Required    Requested Specialty:   Podiatry    Number of Visits Requested:   1  . Ambulatory referral to Hand Surgery    Referral Priority:   Routine    Referral Type:   Surgical    Referral Reason:   Specialty Services Required    Requested Specialty:   Hand Surgery    Number of Visits Requested:   1    Meds ordered this encounter  Medications  . predniSONE (DELTASONE) 20 MG tablet    Sig: Take 3 tabs qd x 3d, then 2 tabs qd x 3d then 1 tab qd x 3d.    Dispense:  18 tablet    Refill:  0  . olmesartan (BENICAR) 40 MG tablet    Sig: Take 1 tablet (40 mg total) by mouth daily.    Dispense:  90 tablet    Refill:  1    I personally performed the services described in this documentation, which was scribed in my presence. The recorded information has been reviewed and considered, and addended by me as needed.   Philip Conley, M.D.  Primary Care at [facility] [address] [place], [place] [address] [phone] phone [phone] fax  01/18/17 10:45 AM

## 2017-01-15 NOTE — Patient Instructions (Signed)
     IF you received an x-ray today, you will receive an invoice from [facility]. Please contact [facility] at [phone] with questions or concerns regarding your invoice.   IF you received labwork today, you will receive an invoice from [facility]. Please contact [facility] at [phone] with questions or concerns regarding your invoice.   Our billing staff will not be able to assist you with questions regarding bills from these companies.  You will be contacted with the lab results as soon as they are available. The fastest way to get your results is to activate your My Chart account. Instructions are located on the last page of this paperwork. If you have not heard from us regarding the results in 2 weeks, please contact this office.     

## 2017-01-16 LAB — BASIC METABOLIC PANEL
BUN/Creatinine Ratio: 13 (ref 9–20)
BUN: 15 mg/dL (ref 6–24)
CO2: 25 mmol/L (ref 20–29)
Calcium: 9.4 mg/dL (ref 8.7–10.2)
Chloride: 102 mmol/L (ref 96–106)
Creatinine, Ser: 1.19 mg/dL (ref 0.76–1.27)
GFR calc Af Amer: 83 mL/min/{1.73_m2} (ref >59)
GFR calc non Af Amer: 72 mL/min/{1.73_m2} (ref >59)
Glucose: 121 mg/dL — ABNORMAL HIGH (ref 65–99)
Potassium: 4.8 mmol/L (ref 3.5–5.2)
Sodium: 142 mmol/L (ref 134–144)

## 2017-01-25 ENCOUNTER — Ambulatory Visit (INDEPENDENT_AMBULATORY_CARE_PROVIDER_SITE_OTHER): Payer: BLUE CROSS/BLUE SHIELD

## 2017-01-25 ENCOUNTER — Encounter: Payer: Self-pay | Admitting: Podiatry

## 2017-01-25 ENCOUNTER — Other Ambulatory Visit: Payer: Self-pay | Admitting: Podiatry

## 2017-01-25 ENCOUNTER — Ambulatory Visit (INDEPENDENT_AMBULATORY_CARE_PROVIDER_SITE_OTHER): Payer: BLUE CROSS/BLUE SHIELD | Admitting: Podiatry

## 2017-01-25 VITALS — BP 133/85 | HR 80 | Resp 16

## 2017-01-25 DIAGNOSIS — M722 Plantar fascial fibromatosis: Secondary | ICD-10-CM

## 2017-01-25 DIAGNOSIS — M778 Other enthesopathies, not elsewhere classified: Secondary | ICD-10-CM

## 2017-01-25 DIAGNOSIS — M779 Enthesopathy, unspecified: Principal | ICD-10-CM

## 2017-01-25 MED ORDER — MELOXICAM 15 MG PO TABS: 15.0000 mg | ORAL_TABLET | Freq: Every day | ORAL | 3 refills | Status: DC

## 2017-01-25 NOTE — Progress Notes (Signed)
Subjective:  Patient ID: Philip Conley, male    DOB: 01/24/1971,  MRN: [id number] HPI Chief Complaint  Patient presents with  . Foot Pain    Plantar foot (whole) right - aching x 1 year, worsened recently, whole bottom of foot hurts at different days, AM pain, tried arthritis cream, PCP rx'd prednisone and gel heel cups    46 y.o. male presents with the above complaint.     Past Medical History:  Diagnosis Date  . Allergy    Past Surgical History:  Procedure Laterality Date  . HAND SURGERY     Growth removal     Current Outpatient Medications:  .  AFLURIA QUADRIVALENT 0.5 ML injection, inject 0.5 milliliter intramuscularly, Disp: , Rfl: 0 .  Aspirin-Acetaminophen-Caffeine (EXCEDRIN MIGRAINE PO), Take by mouth as needed. Reported on 07/05/2015, Disp: , Rfl:  .  cyclobenzaprine (FLEXERIL) 10 MG tablet, Take 1 tablet (10 mg total) by mouth 3 (three) times daily as needed for muscle spasms., Disp: 30 tablet, Rfl: 1 .  furosemide (LASIX) 40 MG tablet, TAKE 1 TABLET(40 MG) BY MOUTH twice daily as directed by physician, Disp: 180 tablet, Rfl: 1 .  lisinopril (PRINIVIL,ZESTRIL) 40 MG tablet, , Disp: , Rfl: 0 .  olmesartan (BENICAR) 40 MG tablet, Take 1 tablet (40 mg total) by mouth daily., Disp: 90 tablet, Rfl: 1 .  potassium chloride SA (K-DUR,KLOR-CON) 20 MEQ tablet, Take 1 tablet (20 mEq total) by mouth 2 (two) times daily., Disp: 180 tablet, Rfl: 1 .  triamterene-hydrochlorothiazide (MAXZIDE-25) 37.5-25 MG tablet, Take 1 tablet by mouth daily., Disp: 90 tablet, Rfl: 1  No Known Allergies Review of Systems  All other systems reviewed and are negative.  Objective:   Vitals:   01/25/17 1528  BP: 133/85  Pulse: 80  Resp: 16    General: Well developed, nourished, in no acute distress, alert and oriented x3   Dermatological: Skin is warm, dry and supple bilateral. Nails x 10 are well maintained; remaining integument appears unremarkable at this time. There are no open sores, no  preulcerative lesions, no rash or signs of infection present.  Vascular: Dorsalis Pedis artery and Posterior Tibial artery pedal pulses are 2/4 bilateral with immedate capillary fill time. Pedal hair growth present. No varicosities and no lower extremity edema present bilateral.   Neruologic: Grossly intact via light touch bilateral. Vibratory intact via tuning fork bilateral. Protective threshold with Semmes Wienstein monofilament intact to all pedal sites bilateral. Patellar and Achilles deep tendon reflexes 2+ bilateral. No Babinski or clonus noted bilateral.   Musculoskeletal: No gross boney pedal deformities bilateral. No pain, crepitus, or limitation noted with foot and ankle range of motion bilateral. Muscular strength 5/5 in all groups tested bilateral. Pain on palpation medially located to the right heel. No pain on medial and lateral compression of the calcaneus.  Gait: Unassisted, Nonantalgic.    Radiographs:  Radiographs taken today demonstrate soft tissue increase in density of the plantar fascia calcaneal insertion site no other major abnormalities are noted.  Assessment & Plan:   Assessment: Plantar fasciitis of the right foot. Mild pes planus.  Plan: Discussed etiology pathology conservative versus surgical therapies. He is recently had a prednisone pack. We will go ahead and prescribe him a one a day anti-inflammatory meloxicam 15 mg. I injected the right heel today with Kenalog and local anesthetic placement of a fracture brace the following night splint. I will follow up with him in 1 month. He is provided with oral and written home-going  instructions for stretching. May need to consider orthotics.     Max T. Pike, [place]

## 2017-01-25 NOTE — Patient Instructions (Signed)

## 2017-02-22 ENCOUNTER — Ambulatory Visit: Payer: BLUE CROSS/BLUE SHIELD | Admitting: Podiatry

## 2017-02-24 DIAGNOSIS — G5601 Carpal tunnel syndrome, right upper limb: Secondary | ICD-10-CM | POA: Diagnosis not present

## 2017-03-03 ENCOUNTER — Ambulatory Visit: Payer: BLUE CROSS/BLUE SHIELD | Admitting: Podiatry

## 2017-03-22 ENCOUNTER — Ambulatory Visit: Payer: BLUE CROSS/BLUE SHIELD | Admitting: Podiatry

## 2017-03-28 DIAGNOSIS — G5601 Carpal tunnel syndrome, right upper limb: Secondary | ICD-10-CM | POA: Insufficient documentation

## 2017-03-29 ENCOUNTER — Ambulatory Visit: Payer: BLUE CROSS/BLUE SHIELD | Admitting: Podiatry

## 2017-04-05 ENCOUNTER — Ambulatory Visit: Payer: BLUE CROSS/BLUE SHIELD | Admitting: Podiatry

## 2017-04-05 DIAGNOSIS — G5601 Carpal tunnel syndrome, right upper limb: Secondary | ICD-10-CM | POA: Diagnosis not present

## 2017-04-09 ENCOUNTER — Other Ambulatory Visit: Payer: Self-pay | Admitting: Family Medicine

## 2017-05-16 ENCOUNTER — Telehealth: Payer: Self-pay | Admitting: Family Medicine

## 2017-05-16 NOTE — Telephone Encounter (Signed)
Copied from [place]. Topic: General - Other >> May 16, 2017  8:22 AM Darl Householder, RMA wrote: Reason for CRM: patient is requesting a prescription for flu symptoms to be sent to [facility]

## 2017-05-17 ENCOUNTER — Ambulatory Visit: Payer: BLUE CROSS/BLUE SHIELD | Admitting: Urgent Care

## 2017-05-17 NOTE — Telephone Encounter (Signed)
Patient has appointment on 05/18/2017

## 2017-05-18 ENCOUNTER — Ambulatory Visit: Payer: BLUE CROSS/BLUE SHIELD | Admitting: Family Medicine

## 2017-05-18 ENCOUNTER — Encounter: Payer: Self-pay | Admitting: Family Medicine

## 2017-05-18 ENCOUNTER — Other Ambulatory Visit: Payer: Self-pay

## 2017-05-18 VITALS — BP 162/90 | HR 107 | Temp 98.5°F | Resp 20 | Ht 65.0 in | Wt 276.8 lb

## 2017-05-18 DIAGNOSIS — B309 Viral conjunctivitis, unspecified: Secondary | ICD-10-CM

## 2017-05-18 DIAGNOSIS — J0100 Acute maxillary sinusitis, unspecified: Secondary | ICD-10-CM

## 2017-05-18 DIAGNOSIS — R6889 Other general symptoms and signs: Secondary | ICD-10-CM | POA: Diagnosis not present

## 2017-05-18 MED ORDER — IPRATROPIUM BROMIDE 0.03 % NA SOLN: 2.0000 | Freq: Four times a day (QID) | NASAL | 1 refills | Status: DC

## 2017-05-18 MED ORDER — AZITHROMYCIN 250 MG PO TABS: ORAL_TABLET | ORAL | 0 refills | Status: DC

## 2017-05-18 MED ORDER — PROMETHAZINE-CODEINE 6.25-10 MG/5ML PO SYRP: 10.0000 mL | ORAL_SOLUTION | ORAL | 0 refills | Status: DC | PRN

## 2017-05-18 MED ORDER — PREDNISONE 20 MG PO TABS: ORAL_TABLET | ORAL | 0 refills | Status: DC

## 2017-05-18 NOTE — Patient Instructions (Addendum)
I recommend frequent warm salt water gargles, hot tea with honey and lemon, rest, and handwashing.  Hot showers or breathing in steam may help loosen the congestion.  Try a netti pot or sinus rinse is also likely to help you feel better and keep this from progressing.  You should stay away from all cough and cold medications containing decongestant, especially phenylephrine and pseudoephedrine (will be listed under the active ingredient list).  To make it easier, CoricidinHBP is a product tailored towards people with hypertension. You can also use the ipratropium nasal spray that I have sent to your pharmacy for a decongestant instead of the otc cold meds as this won't increase your blood pressure.   Some homemade cough syrups can be just as effective as the over-the-counter medications with much fewer side effects. Also they can be combined with over-the-counter and prescription cough regimens, to make them even more effective together. Below is a recipe for my favorite:  Sweet lemon, honey, and thyme cough syrup recipe Place half of a chopped lemon in a container and cover with half a cup of honey (raw is best but any will do).  Then boil a handful of fresh thyme sprigs or organic dry leaves with 2 cups of water uncovered until it is reduced to one cup. Strain this liquid into the jar with the lemon and honey to remove the steams and leaves and then shake it up. Use 1 teaspoonful as often as needed and it will store in the refrigerator for at least a month.     IF you received an x-ray today, you will receive an invoice from [facility]. Please contact [facility] at [phone] with questions or concerns regarding your invoice.   IF you received labwork today, you will receive an invoice from [facility]. Please contact [facility] at [phone] with questions or concerns regarding your invoice.   Our billing staff will not be able to assist you with questions regarding bills  from these companies.  You will be contacted with the lab results as soon as they are available. The fastest way to get your results is to activate your My Chart account. Instructions are located on the last page of this paperwork. If you have not heard from [place] regarding the results in 2 weeks, please contact this office.     Sinusitis, Adult Sinusitis is soreness and inflammation of your sinuses. Sinuses are hollow spaces in the bones around your face. Your sinuses are located:  Around your eyes.  In the middle of your forehead.  Behind your nose.  In your cheekbones.  Your sinuses and nasal passages are lined with a stringy fluid (mucus). Mucus normally drains out of your sinuses. When your nasal tissues become inflamed or swollen, the mucus can become trapped or blocked so air cannot flow through your sinuses. This allows bacteria, viruses, and funguses to grow, which leads to infection. Sinusitis can develop quickly and last for 7?10 days (acute) or for more than 12 weeks (chronic). Sinusitis often develops after a cold. What are the causes? This condition is caused by anything that creates swelling in the sinuses or stops mucus from draining, including:  Allergies.  Asthma.  Bacterial or viral infection.  Abnormally shaped bones between the nasal passages.  Nasal growths that contain mucus (nasal polyps).  Narrow sinus openings.  Pollutants, such as chemicals or irritants in the air.  A foreign object stuck in the nose.  A fungal infection. This is rare.  What increases  the risk? The following factors may make you more likely to develop this condition:  Having allergies or asthma.  Having had a recent cold or respiratory tract infection.  Having structural deformities or blockages in your nose or sinuses.  Having a weak immune system.  Doing a lot of swimming or diving.  Overusing nasal sprays.  Smoking.  What are the signs or symptoms? The main symptoms  of this condition are pain and a feeling of pressure around the affected sinuses. Other symptoms include:  Upper toothache.  Earache.  Headache.  Bad breath.  Decreased sense of smell and taste.  A cough that may get worse at night.  Fatigue.  Fever.  Thick drainage from your nose. The drainage is often green and it may contain pus (purulent).  Stuffy nose or congestion.  Postnasal drip. This is when extra mucus collects in the throat or back of the nose.  Swelling and warmth over the affected sinuses.  Sore throat.  Sensitivity to light.  How is this diagnosed? This condition is diagnosed based on symptoms, a medical history, and a physical exam. To find out if your condition is acute or chronic, your health care provider may:  Look in your nose for signs of nasal polyps.  Tap over the affected sinus to check for signs of infection.  View the inside of your sinuses using an imaging device that has a light attached (endoscope).  If your health care provider suspects that you have chronic sinusitis, you may also:  Be tested for allergies.  Have a sample of mucus taken from your nose (nasal culture) and checked for bacteria.  Have a mucus sample examined to see if your sinusitis is related to an allergy.  If your sinusitis does not respond to treatment and it lasts longer than 8 weeks, you may have an MRI or CT scan to check your sinuses. These scans also help to determine how severe your infection is. In rare cases, a bone biopsy may be done to rule out more serious types of fungal sinus disease. How is this treated? Treatment for sinusitis depends on the cause and whether your condition is chronic or acute. If a virus is causing your sinusitis, your symptoms will go away on their own within 10 days. You may be given medicines to relieve your symptoms, including:  Topical nasal decongestants. They shrink swollen nasal passages and let mucus drain from your  sinuses.  Antihistamines. These drugs block inflammation that is triggered by allergies. This can help to ease swelling in your nose and sinuses.  Topical nasal corticosteroids. These are nasal sprays that ease inflammation and swelling in your nose and sinuses.  Nasal saline washes. These rinses can help to get rid of thick mucus in your nose.  If your condition is caused by bacteria, you will be given an antibiotic medicine. If your condition is caused by a fungus, you will be given an antifungal medicine. Surgery may be needed to correct underlying conditions, such as narrow nasal passages. Surgery may also be needed to remove polyps. Follow these instructions at home: Medicines  Take, use, or apply over-the-counter and prescription medicines only as told by your health care provider. These may include nasal sprays.  If you were prescribed an antibiotic medicine, take it as told by your health care provider. Do not stop taking the antibiotic even if you start to feel better. Hydrate and Humidify  Drink enough water to keep your urine clear or pale yellow.  Staying hydrated will help to thin your mucus.  Use a cool mist humidifier to keep the humidity level in your home above 50%.  Inhale steam for 10-15 minutes, 3-4 times a day or as told by your health care provider. You can do this in the bathroom while a hot shower is running.  Limit your exposure to cool or dry air. Rest  Rest as much as possible.  Sleep with your head raised (elevated).  Make sure to get enough sleep each night. General instructions  Apply a warm, moist washcloth to your face 3-4 times a day or as told by your health care provider. This will help with discomfort.  Wash your hands often with soap and water to reduce your exposure to viruses and other germs. If soap and water are not available, use hand sanitizer.  Do not smoke. Avoid being around people who are smoking (secondhand smoke).  Keep all  follow-up visits as told by your health care provider. This is important. Contact a health care provider if:  You have a fever.  Your symptoms get worse.  Your symptoms do not improve within 10 days. Get help right away if:  You have a severe headache.  You have persistent vomiting.  You have pain or swelling around your face or eyes.  You have vision problems.  You develop confusion.  Your neck is stiff.  You have trouble breathing. This information is not intended to replace advice given to you by your health care provider. Make sure you discuss any questions you have with your health care provider. Document Released: 03/01/2005 Document Revised: 10/26/2015 Document Reviewed: 12/25/2014 Elsevier Interactive Patient Education  2018 [employer].  Viral Conjunctivitis, Adult Viral conjunctivitis is an inflammation of the clear membrane that covers the white part of your eye and the inner surface of your eyelid (conjunctiva). The inflammation is caused by a viral infection. The blood vessels in the conjunctiva become inflamed, causing the eye to become red or pink, and often itchy. Viral conjunctivitis can be easily passed from one person to another (is contagious). This condition is often called pink eye. What are the causes? This condition is caused by a virus. A virus is a type of contagious germ. It can be spread by touching objects that have been contaminated with the virus, such as doorknobs or towels. It can also be passed through droplets, such as from coughing or sneezing. What are the signs or symptoms? Symptoms of this condition include:  Eye redness.  Tearing or watery eyes.  Itchy and irritated eyes.  Burning feeling in the eyes.  Clear drainage from the eye.  Swollen eyelids.  A gritty feeling in the eye.  Light sensitivity.  This condition often occurs with other symptoms, such as a fever, nausea, or a rash. How is this diagnosed? This condition is  diagnosed with a medical history and physical exam. If you have discharge from your eye, the discharge may be tested to rule out other causes of conjunctivitis. How is this treated? Viral conjunctivitis does not respond to medicines that kill bacteria (antibiotics). Treatment for viral conjunctivitis is directed at stopping a bacterial infection from developing in addition to the viral infection. Treatment also aims to relieve your symptoms, such as itching. This may be done with antihistamine drops or other eye medicines. Rarely, steroid eye drops or antiviral medicines may be prescribed. Follow these instructions at home: Medicines   Take or apply over-the-counter and prescription medicines only as told by  your health care provider.  Be very careful to avoid touching the edge of the eyelid with the eye drop bottle or ointment tube when applying medicines to the affected eye. Being careful this way will stop you from spreading the infection to the other eye or to other people. Eye care  Avoid touching or rubbing your eyes.  Apply a warm, wet, clean washcloth to your eye for 10-20 minutes, 3-4 times per day or as told by your health care provider.  If you wear contact lenses, do not wear them until the inflammation is gone and your health care provider says it is safe to wear them again. Ask your health care provider how to sterilize or replace your contact lenses before using them again. Wear glasses until you can resume wearing contacts.  Avoid wearing eye makeup until the inflammation is gone. Throw away any old eye cosmetics that may be contaminated.  Gently wipe away any drainage from your eye with a warm, wet washcloth or a cotton ball. General instructions  Change or wash your pillowcase every day or as told by your health care provider.  Do not share towels, pillowcases, washcloths, eye makeup, makeup brushes, contact lenses, or glasses. This may spread the infection.  Wash your  hands often with soap and water. Use paper towels to dry your hands. If soap and water are not available, use hand sanitizer.  Try to avoid contact with other people for one week or as told by your health care provider. Contact a health care provider if:  Your symptoms do not improve with treatment or they get worse.  You have increased pain.  Your vision becomes blurry.  You have a fever.  You have facial pain, redness, or swelling.  You have yellow or green drainage coming from your eye.  You have new symptoms. This information is not intended to replace advice given to you by your health care provider. Make sure you discuss any questions you have with your health care provider. Document Released: 05/22/2002 Document Revised: 09/27/2015 Document Reviewed: 09/16/2015 Elsevier Interactive Patient Education  [employer].

## 2017-05-18 NOTE — Progress Notes (Addendum)
Subjective:  By signing my name below, I, Philip Conley, attest that this documentation has been prepared under the direction and in the presence of [name], MD. Electronically Signed: Moises Conley, [place]. 05/18/2017 , 12:43 PM .  Patient was seen in Room 2 .   Patient ID: Philip Conley, male    DOB: Aug 14, 1970, 47 y.o.   MRN: [id number] Chief Complaint  Patient presents with  . Chest Congestion    x4 days, pt states he is experiencing body aches, fatigue, chills, cough, sore throat, pt states vomiting from coughing.. Pt states he tried OTC Tylenol Cold and Flu and Alka seltzer   HPI Philip Conley is a 47 y.o. male who presents to Primary Care at [facility] complaining of illness that started about 4 days ago. Initially felt like allergies, worse the next day, improved the next day so did some yard work the next day treated congestion with cool most and vics vaporub and then suddenly pus started coming from right eye. Yesterday was alittle better and then worse today.  Horrible abdominal and chest pan from coughing and throat sore. Sun both eyes were matted shut. Eyes haven't been itching or painful but have broken some Conley vessels from coughing to hard.  Has had some chills with this and felt feverish when this began 4d ago in the evening and had horrible soaking night sweats. No initial myalgias but on day 2 did have back pain and HA in back of head and even his head hurts - felt like his scalp was burned.  Is having some post-tussive emesis and decreased appetite due to mucous drainage.  Will have diarrhea after he eats so really has just been sticking to fluids.  Initially was using the new Alka-seltzer that is ok for HTN so then switched to tylenol severe cold and flu and used all of them so yesterday switched back to [place].  Not a lot of rhinitis and can breath fine if using a breathing strip. Does get a recurrent scab in nose that bleeds when he is ill and has had some nose bleeds  after coughing in the shower.  No known exposures. Did get flu shot this year.   He has tried Tylenol cold and flu, and alka-seltzer.    Past Medical History:  Diagnosis Date  . Allergy    Past Surgical History:  Procedure Laterality Date  . HAND SURGERY     Growth removal    Prior to Admission medications   Medication Sig Start Date End Date Taking? Authorizing Provider  AFLURIA QUADRIVALENT 0.5 ML injection inject 0.5 milliliter intramuscularly 12/24/16   [provider]  Aspirin-Acetaminophen-Caffeine (EXCEDRIN MIGRAINE PO) Take by mouth as needed. Reported on 07/05/2015    [provider]  cyclobenzaprine (FLEXERIL) 10 MG tablet Take 1 tablet (10 mg total) by mouth 3 (three) times daily as needed for muscle spasms. 09/06/16   Shawnee Knapp, MD  furosemide (LASIX) 40 MG tablet TAKE 1 TABLET(40 MG) BY MOUTH TWICE DAILY AS DIRECTED BY PHYSICIAN 04/11/17   Shawnee Knapp, MD  lisinopril (PRINIVIL,ZESTRIL) 40 MG tablet  01/11/17   [provider]  meloxicam (MOBIC) 15 MG tablet Take 1 tablet (15 mg total) daily by mouth. 01/25/17   Hyatt, Max T, DPM  olmesartan (BENICAR) 40 MG tablet Take 1 tablet (40 mg total) by mouth daily. 01/15/17   Shawnee Knapp, MD  potassium chloride SA (K-DUR,KLOR-CON) 20 MEQ tablet Take 1 tablet (20 mEq total) by mouth  2 (two) times daily. 09/06/16   Shawnee Knapp, MD  triamterene-hydrochlorothiazide (MAXZIDE-25) 37.5-25 MG tablet Take 1 tablet by mouth daily. 07/26/16   Shawnee Knapp, MD   No Known Allergies Family History  Problem Relation Age of Onset  . Heart disease Mother        mumur  . Lupus Father   . Heart disease Maternal Grandmother   . Lung disease Maternal Grandfather   . Heart disease Paternal Grandmother   . Stroke Paternal Grandfather    Social History   Socioeconomic History  . Marital status: Married    Spouse name: None  . Number of children: None  . Years of education: None  . Highest education level: None  Social  Needs  . Financial resource strain: None  . Food insecurity - worry: None  . Food insecurity - inability: None  . Transportation needs - medical: None  . Transportation needs - non-medical: None  Occupational History  . None  Tobacco Use  . Smoking status: Never Smoker  . Smokeless tobacco: Never Used  Substance and Sexual Activity  . Alcohol use: None  . Drug use: None  . Sexual activity: Yes    Birth control/protection: None    Comment: number of sex partners in the last 39 months  1  Other Topics Concern  . None  Social History Narrative  . None   Depression screen [facility] 2/9 05/18/2017 01/15/2017 09/06/2016 07/26/2016 07/05/2015  Decreased Interest 0 0 0 0 0  Down, Depressed, Hopeless 0 0 0 0 0  PHQ - 2 Score 0 0 0 0 0    Review of Systems  Constitutional: Positive for chills and fatigue.  HENT: Positive for congestion and sore throat.   Respiratory: Positive for cough.   Gastrointestinal: Positive for vomiting.  Musculoskeletal: Positive for myalgias (generalized).       Objective:   Physical Exam  Constitutional: He is oriented to person, place, and time. He appears well-developed and well-nourished. No distress.  HENT:  Head: Normocephalic and atraumatic.  Eyes: EOM are normal. Pupils are equal, round, and reactive to light.  Neck: Neck supple.  Cardiovascular: Normal rate.  Pulmonary/Chest: Effort normal. No respiratory distress.  Musculoskeletal: Normal range of motion.  Neurological: He is alert and oriented to person, place, and time.  Skin: Skin is warm and dry.  Psychiatric: He has a normal mood and affect. His behavior is normal.  Nursing note and vitals reviewed.   BP (!) 162/90 (BP Location: Left Arm, Patient Position: Sitting, Cuff Size: Large)   Pulse (!) 107   Temp 98.5 F (36.9 C) (Oral)   Resp 20   Ht 5\' 5"  (1.651 m)   Wt 276 lb 12.8 oz (125.6 kg)   SpO2 97%   BMI 46.06 kg/m      Assessment & Plan:   1. Acute non-recurrent maxillary  sinusitis   2. Viral conjunctivitis of both eyes   3. Flu-like symptoms     Meds ordered this encounter  Medications  . predniSONE (DELTASONE) 20 MG tablet    Sig: Take 3 tabs qd x 2d, then 2 tabs qd x 2d then 1 tab qd x 2d.    Dispense:  12 tablet    Refill:  0  . azithromycin (ZITHROMAX) 250 MG tablet    Sig: Take 2 tabs PO x 1 dose, then 1 tab PO QD x 4 days    Dispense:  6 tablet    Refill:  0  .  promethazine-codeine (PHENERGAN WITH CODEINE) 6.25-10 MG/5ML syrup    Sig: Take 10 mLs by mouth every 4 (four) hours as needed for cough.    Dispense:  180 mL    Refill:  0  . ipratropium (ATROVENT) 0.03 % nasal spray    Sig: Place 2 sprays into the nose 4 (four) times daily. For nasal congestion    Dispense:  30 mL    Refill:  1    I personally performed the services described in this documentation, which was scribed in my presence. The recorded information has been reviewed and considered, and addended by me as needed.   [name], M.D.  Primary Care at [facility] [address] [place], [place] [address] [phone] phone [phone] fax  05/21/17 9:17 AM

## 2017-06-19 ENCOUNTER — Other Ambulatory Visit: Payer: Self-pay | Admitting: Family Medicine

## 2017-06-20 NOTE — Telephone Encounter (Signed)
K-Dur and Lasix refill requests  LOV 01/15/17 with Dr. Brigitte Pulse  [facility] [place], [place] - [address]

## 2017-07-04 ENCOUNTER — Telehealth: Payer: Self-pay | Admitting: Family Medicine

## 2017-07-05 NOTE — Telephone Encounter (Signed)
Olmesartan refill Last OV: 01/15/17 Last Refill:01/15/17 #90 tabs 1 RF Pharmacy:[facility] [address] [name] MD

## 2017-07-13 MED ORDER — OLMESARTAN MEDOXOMIL-HCTZ 40-25 MG PO TABS: 1.0000 | ORAL_TABLET | Freq: Every day | ORAL | 1 refills | Status: DC

## 2017-07-13 NOTE — Telephone Encounter (Signed)
Yes, sent over rx for olmesartan hctz 40-25 instead w/ note that it is fine to switch pt to olmesartan-hctz 40-12.5 if needed due to drug availability.

## 2017-07-13 NOTE — Addendum Note (Signed)
Addended by: Shawnee Knapp on: 07/13/2017 07:26 PM   Modules accepted: Orders

## 2017-07-13 NOTE — Telephone Encounter (Signed)
[facility] said the olmesartan (BENICAR) 40 MG tablet  Is back ordered and is asking if doctor wants to do Olmesartan HCTZ or a different mediation

## 2017-07-13 NOTE — Telephone Encounter (Signed)
Please advise 

## 2017-08-02 ENCOUNTER — Encounter: Payer: Self-pay | Admitting: Family Medicine

## 2017-08-02 ENCOUNTER — Ambulatory Visit (INDEPENDENT_AMBULATORY_CARE_PROVIDER_SITE_OTHER): Payer: BLUE CROSS/BLUE SHIELD | Admitting: Family Medicine

## 2017-08-02 VITALS — BP 146/98 | HR 78 | Temp 98.3°F | Resp 18 | Ht 66.26 in | Wt 284.4 lb

## 2017-08-02 DIAGNOSIS — Z1329 Encounter for screening for other suspected endocrine disorder: Secondary | ICD-10-CM | POA: Diagnosis not present

## 2017-08-02 DIAGNOSIS — I10 Essential (primary) hypertension: Secondary | ICD-10-CM

## 2017-08-02 DIAGNOSIS — Z1389 Encounter for screening for other disorder: Secondary | ICD-10-CM

## 2017-08-02 DIAGNOSIS — I872 Venous insufficiency (chronic) (peripheral): Secondary | ICD-10-CM

## 2017-08-02 DIAGNOSIS — Z136 Encounter for screening for cardiovascular disorders: Secondary | ICD-10-CM

## 2017-08-02 DIAGNOSIS — E785 Hyperlipidemia, unspecified: Secondary | ICD-10-CM

## 2017-08-02 DIAGNOSIS — Z113 Encounter for screening for infections with a predominantly sexual mode of transmission: Secondary | ICD-10-CM

## 2017-08-02 DIAGNOSIS — Z130 Encounter for screening for diseases of the blood and blood-forming organs and certain disorders involving the immune mechanism: Secondary | ICD-10-CM

## 2017-08-02 DIAGNOSIS — Z6841 Body Mass Index (BMI) 40.0 and over, adult: Secondary | ICD-10-CM

## 2017-08-02 DIAGNOSIS — Z1383 Encounter for screening for respiratory disorder NEC: Secondary | ICD-10-CM | POA: Diagnosis not present

## 2017-08-02 DIAGNOSIS — Z131 Encounter for screening for diabetes mellitus: Secondary | ICD-10-CM

## 2017-08-02 DIAGNOSIS — Z0000 Encounter for general adult medical examination without abnormal findings: Secondary | ICD-10-CM

## 2017-08-02 LAB — POCT URINALYSIS DIP (MANUAL ENTRY)
Bilirubin, UA: NEGATIVE
Blood, UA: NEGATIVE
Glucose, UA: NEGATIVE mg/dL
Ketones, POC UA: NEGATIVE mg/dL
Nitrite, UA: NEGATIVE
Protein Ur, POC: NEGATIVE mg/dL
Spec Grav, UA: 1.015 (ref 1.010–1.025)
Urobilinogen, UA: 0.2 E.U./dL
pH, UA: 7 (ref 5.0–8.0)

## 2017-08-02 MED ORDER — CARVEDILOL 6.25 MG PO TABS: 6.2500 mg | ORAL_TABLET | Freq: Two times a day (BID) | ORAL | 0 refills | Status: DC

## 2017-08-02 MED ORDER — POTASSIUM CHLORIDE CRYS ER 20 MEQ PO TBCR: 40.0000 meq | EXTENDED_RELEASE_TABLET | Freq: Two times a day (BID) | ORAL | 0 refills | Status: DC

## 2017-08-02 MED ORDER — FUROSEMIDE 40 MG PO TABS: 80.0000 mg | ORAL_TABLET | Freq: Two times a day (BID) | ORAL | 0 refills | Status: DC

## 2017-08-02 NOTE — Patient Instructions (Addendum)
IF you received an x-ray today, you will receive an invoice from [facility]. Please contact [facility] at [phone] with questions or concerns regarding your invoice.   IF you received labwork today, you will receive an invoice from [facility]. Please contact [facility] at [phone] with questions or concerns regarding your invoice.   Our billing staff will not be able to assist you with questions regarding bills from these companies.  You will be contacted with the lab results as soon as they are available. The fastest way to get your results is to activate your My Chart account. Instructions are located on the last page of this paperwork. If you have not heard from [place] regarding the results in 2 weeks, please contact this office.    Keeping you healthy  Get these tests  Blood pressure- Have your blood pressure checked once a year by your healthcare provider.  Normal blood pressure is 120/80.  Weight- Have your body mass index (BMI) calculated to screen for obesity.  BMI is a measure of body fat based on height and weight. You can also calculate your own BMI at [employer]velBags.it.  Cholesterol- Have your cholesterol checked regularly starting at age 21, sooner may be necessary if you have diabetes, high blood pressure, if a family member developed heart diseases at an early age or if you smoke.   Chlamydia, HIV, and other sexual transmitted disease- Get screened each year until the age of 65 then within three months of each new sexual partner.  Diabetes- Have your blood sugar checked regularly if you have high blood pressure, high cholesterol, a family history of diabetes or if you are overweight.  Get these vaccines  Flu shot- Every fall.  Tetanus shot- Every 10 years.  Menactra- Single dose; prevents meningitis.  Take these steps  Don't smoke- If you do smoke, ask your healthcare provider about quitting. For tips on how to quit, go to  www.smokefree.gov or call 1-800-QUIT-NOW.  Be physically active- Exercise 5 days a week for at least 30 minutes.  If you are not already physically active start slow and gradually work up to 30 minutes of moderate physical activity.  Examples of moderate activity include walking briskly, mowing the yard, dancing, swimming bicycling, etc.  Eat a healthy diet- Eat a variety of healthy foods such as fruits, vegetables, low fat milk, low fat cheese, yogurt, lean meats, poultry, fish, beans, tofu, etc.  For more information on healthy eating, go to www.thenutritionsource.org  Drink alcohol in moderation- Limit alcohol intake two drinks or less a day.  Never drink and drive.  Dentist- Brush and floss teeth twice daily; visit your dentis twice a year.  Depression-Your emotional health is as important as your physical health.  If you're feeling down, losing interest in things you normally enjoy please talk with your healthcare provider.  Gun Safety- If you keep a gun in your home, keep it unloaded and with the safety lock on.  Bullets should be stored separately.  Helmet use- Always wear a helmet when riding a motorcycle, bicycle, rollerblading or skateboarding.  Safe sex- If you may be exposed to a sexually transmitted infection, use a condom  Seat belts- Seat bels can save your life; always wear one.  Smoke/Carbon Monoxide detectors- These detectors need to be installed on the appropriate level of your home.  Replace batteries at least once a year.  Skin Cancer- When out in the sun, cover up and use sunscreen SPF 15 or higher.  Violence- If anyone is threatening or hurting you, please tell your healthcare provider.   Chronic Venous Insufficiency Chronic venous insufficiency, also called venous stasis, is a condition that prevents blood from being pumped effectively through the veins in your legs. Blood may no longer be pumped effectively from the legs back to the heart. This condition can range  from mild to severe. With proper treatment, you should be able to continue with an active life. What are the causes? Chronic venous insufficiency occurs when the vein walls become stretched, weakened, or damaged, or when valves within the vein are damaged. Some common causes of this include:  High blood pressure inside the veins (venous hypertension).  Increased blood pressure in the leg veins from long periods of sitting or standing.  A blood clot that blocks blood flow in a vein (deep vein thrombosis, DVT).  Inflammation of a vein (phlebitis) that causes a blood clot to form.  Tumors in the pelvis that cause blood to back up.  What increases the risk? The following factors may make you more likely to develop this condition:  Having a family history of this condition.  Obesity.  Pregnancy.  Living without enough physical activity or exercise (sedentary lifestyle).  Smoking.  Having a job that requires long periods of standing or sitting in one place.  Being a certain age. Women in their 5s and 23s and men in their 46s are more likely to develop this condition.  What are the signs or symptoms? Symptoms of this condition include:  Veins that are enlarged, bulging, or twisted (varicose veins).  Skin breakdown or ulcers.  Reddened or discolored skin on the front of the leg.  Brown, smooth, tight, and painful skin just above the ankle, usually on the inside of the leg (lipodermatosclerosis).  Swelling.  How is this diagnosed? This condition may be diagnosed based on:  Your medical history.  A physical exam.  Tests, such as: ? A procedure that creates an image of a blood vessel and nearby organs and provides information about blood flow through the blood vessel (duplex ultrasound). ? A procedure that tests blood flow (plethysmography). ? A procedure to look at the veins using X-ray and dye (venogram).  How is this treated? The goals of treatment are to help you  return to an active life and to minimize pain or disability. Treatment depends on the severity of your condition, and it may include:  Wearing compression stockings. These can help relieve symptoms and help prevent your condition from getting worse. However, they do not cure the condition.  Sclerotherapy. This is a procedure involving an injection of a material that "dissolves" damaged veins.  Surgery. This may involve: ? Removing a diseased vein (vein stripping). ? Cutting off blood flow through the vein (laser ablation surgery). ? Repairing a valve.  Follow these instructions at home:  Wear compression stockings as told by your health care provider. These stockings help to prevent blood clots and reduce swelling in your legs.  Take over-the-counter and prescription medicines only as told by your health care provider.  Stay active by exercising, walking, or doing different activities. Ask your health care provider what activities are safe for you and how much exercise you need.  Drink enough fluid to keep your urine clear or pale yellow.  Do not use any products that contain nicotine or tobacco, such as cigarettes and e-cigarettes. If you need help quitting, ask your health care provider.  Keep all follow-up visits as told  by your health care provider. This is important. Contact a health care provider if:  You have redness, swelling, or more pain in the affected area.  You see a red streak or line that extends up or down from the affected area.  You have skin breakdown or a loss of skin in the affected area, even if the breakdown is small.  You get an injury in the affected area. Get help right away if:  You get an injury and an open wound in the affected area.  You have severe pain that does not get better with medicine.  You have sudden numbness or weakness in the foot or ankle below the affected area, or you have trouble moving your foot or ankle.  You have a fever and  you have worse or persistent symptoms.  You have chest pain.  You have shortness of breath. Summary  Chronic venous insufficiency, also called venous stasis, is a condition that prevents blood from being pumped effectively through the veins in your legs.  Chronic venous insufficiency occurs when the vein walls become stretched, weakened, or damaged, or when valves within the vein are damaged.  Treatment for this condition depends on how severe your condition is, and it may involve wearing compression stockings or having a procedure.  Make sure you stay active by exercising, walking, or doing different activities. Ask your health care provider what activities are safe for you and how much exercise you need. This information is not intended to replace advice given to you by your health care provider. Make sure you discuss any questions you have with your health care provider. Document Released: 07/05/2006 Document Revised: 01/19/2016 Document Reviewed: 01/19/2016 Elsevier Interactive Patient Education  2017 [employer].

## 2017-08-02 NOTE — Progress Notes (Signed)
Subjective:  By signing my name below, I, [name], attest that this documentation has been prepared under the direction and in the presence of [name], MD. Electronically Signed: [name], [place]. 08/02/2017 , 10:21 AM .  Patient was seen in Room 1 .   Patient ID: Philip Conley, male    DOB: 19-Sep-1970, 47 y.o.   MRN: [id number] Chief Complaint  Patient presents with  . Annual Exam   HPI Primary Preventative Screenings: Prostate Cancer: done today.  Cardiac: updated today.  Weight/Blood sugar/Diet/Exercise: BMI Readings from Last 3 Encounters:  08/02/17 45.54 kg/m  05/18/17 46.06 kg/m  01/15/17 40.67 kg/m   Lab Results  Component Value Date   HGBA1C 5.5 04/07/2015   OTC/Vit/Supp/Herbal: Dentist/Optho: Immunizations:  Immunization History  Administered Date(s) Administered  . Influenza-Unspecified 12/24/2016  . Tdap 01/15/2017    Chronic Medical Conditions: HTN: He noticed bilateral leg swelling L>R on most days. He takes potassium and Lasix before he leaves for work in the morning. His second dose of Lasix is around 4:00PM, and olmesartan around 9:00PM. He drinks about 2 gallons of water a day. His BP usually runs around 150/85. He tries to wear his compression stockings in the winter. He usually sleeps on his sides, because he snores if he sleeps on his back. His wife informed him that he does pause in his breathing, but patient denies feeling it. He denies daytime somnolence. He's active at work. He has a headache about once a week, but usually eases up after he moves around.  He's tried elevating his feet up at night when sleeping. He mentions occasionally cramping in his right lateral upper shin.   He was losing weight last year, but after winter, he started gaining weight back even though he eats less now.  He sleeps around 10:30-11:00PM, and he wakes up every morning around 4:30AM.    Past Medical History:  Diagnosis Date  . Allergy    Past  Surgical History:  Procedure Laterality Date  . HAND SURGERY     Growth removal    Current Outpatient Medications on File Prior to Visit  Medication Sig Dispense Refill  . Aspirin-Acetaminophen-Caffeine (EXCEDRIN MIGRAINE PO) Take by mouth as needed. Reported on 07/05/2015    . furosemide (LASIX) 40 MG tablet TAKE 1 TABLET(40 MG) BY MOUTH TWICE DAILY AS DIRECTED BY PHYSICIAN 180 tablet 0  . olmesartan-hydrochlorothiazide (BENICAR HCT) 40-25 MG tablet Take 1 tablet by mouth daily. 90 tablet 1  . potassium chloride SA (K-DUR,KLOR-CON) 20 MEQ tablet Take 1 tablet (20 mEq total) by mouth 2 (two) times daily. 180 tablet 1  . AFLURIA QUADRIVALENT 0.5 ML injection inject 0.5 milliliter intramuscularly  0  . azithromycin (ZITHROMAX) 250 MG tablet Take 2 tabs PO x 1 dose, then 1 tab PO QD x 4 days (Patient not taking: Reported on 08/02/2017) 6 tablet 0  . cyclobenzaprine (FLEXERIL) 10 MG tablet Take 1 tablet (10 mg total) by mouth 3 (three) times daily as needed for muscle spasms. (Patient not taking: Reported on 05/18/2017) 30 tablet 1  . ipratropium (ATROVENT) 0.03 % nasal spray Place 2 sprays into the nose 4 (four) times daily. For nasal congestion (Patient not taking: Reported on 08/02/2017) 30 mL 1  . meloxicam (MOBIC) 15 MG tablet Take 1 tablet (15 mg total) daily by mouth. (Patient not taking: Reported on 05/18/2017) 30 tablet 3  . olmesartan (BENICAR) 40 MG tablet TAKE 1 TABLET(40 MG) BY MOUTH DAILY (Patient not taking: Reported on 08/02/2017)  30 tablet 0  . predniSONE (DELTASONE) 20 MG tablet Take 3 tabs qd x 2d, then 2 tabs qd x 2d then 1 tab qd x 2d. (Patient not taking: Reported on 08/02/2017) 12 tablet 0  . promethazine-codeine (PHENERGAN WITH CODEINE) 6.25-10 MG/5ML syrup Take 10 mLs by mouth every 4 (four) hours as needed for cough. (Patient not taking: Reported on 08/02/2017) 180 mL 0   No current facility-administered medications on file prior to visit.    No Known Allergies Family History    Problem Relation Age of Onset  . Heart disease Mother        mumur  . Hypertension Mother   . Lupus Father   . Stroke Father   . Heart disease Maternal Grandmother   . Lung disease Maternal Grandfather   . Heart disease Paternal Grandmother   . Stroke Paternal Grandfather    Social History   Socioeconomic History  . Marital status: Married    Spouse name: Not on file  . Number of children: 3  . Years of education: Not on file  . Highest education level: Not on file  Occupational History  . Not on file  Social Needs  . Financial resource strain: Not on file  . Food insecurity:    Worry: Not on file    Inability: Not on file  . Transportation needs:    Medical: Not on file    Non-medical: Not on file  Tobacco Use  . Smoking status: Never Smoker  . Smokeless tobacco: Never Used  Substance and Sexual Activity  . Alcohol use: Not Currently    Frequency: Never    Comment: occ  . Drug use: Never  . Sexual activity: Yes    Birth control/protection: None    Comment: number of sex partners in the last 12 months  1  Lifestyle  . Physical activity:    Days per week: Not on file    Minutes per session: Not on file  . Stress: Not on file  Relationships  . Social connections:    Talks on phone: Not on file    Gets together: Not on file    Attends religious service: Not on file    Active member of club or organization: Not on file    Attends meetings of clubs or organizations: Not on file    Relationship status: Not on file  Other Topics Concern  . Not on file  Social History Narrative  . Not on file   Depression screen [facility] 2/9 05/18/2017 01/15/2017 09/06/2016 07/26/2016 07/05/2015  Decreased Interest 0 0 0 0 0  Down, Depressed, Hopeless 0 0 0 0 0  PHQ - 2 Score 0 0 0 0 0   Review of Systems  Constitutional: Negative for fatigue and unexpected weight change.  Eyes: Negative for visual disturbance.  Respiratory: Negative for cough, chest tightness and shortness of breath.    Cardiovascular: Positive for leg swelling. Negative for chest pain and palpitations.  Gastrointestinal: Negative for abdominal pain and blood in stool.  Neurological: Negative for dizziness, light-headedness and headaches.       Objective:   Physical Exam  Constitutional: He is oriented to person, place, and time. He appears well-developed and well-nourished. No distress.  HENT:  Head: Normocephalic and atraumatic.  Right Ear: Tympanic membrane normal.  Left Ear: Tympanic membrane normal.  Nose: Nose normal.  Postnasal drip present  Eyes: Pupils are equal, round, and reactive to light. EOM are normal.  Neck: Neck supple.  Cardiovascular: Normal rate.  Pulses:      Dorsalis pedis pulses are 2+ on the right side, and 2+ on the left side.  Pulmonary/Chest: Effort normal and breath sounds normal. No respiratory distress.  Genitourinary: Prostate normal. Prostate is not enlarged and not tender.  Musculoskeletal: Normal range of motion.  Pedal edema 1+ bilaterally  Neurological: He is alert and oriented to person, place, and time.  Skin: Skin is warm and dry.  Psychiatric: He has a normal mood and affect. His behavior is normal.  Nursing note and vitals reviewed.   BP (!) 144/98 (BP Location: Left Arm, Patient Position: Sitting, Cuff Size: Large)   Pulse 78   Temp 98.3 F (36.8 C) (Oral)   Resp 18   Ht 5' 6.26" (1.683 m)   Wt 284 lb 6.4 oz (129 kg)   SpO2 96%   BMI 45.54 kg/m   Results for orders placed or performed in visit on 08/02/17  POCT urinalysis dipstick  Result Value Ref Range   Color, UA yellow yellow   Clarity, UA clear clear   Glucose, UA negative negative mg/dL   Bilirubin, UA negative negative   Ketones, POC UA negative negative mg/dL   Spec Grav, UA 1.015 1.010 - 1.025   Blood, UA negative negative   pH, UA 7.0 5.0 - 8.0   Protein Ur, POC negative negative mg/dL   Urobilinogen, UA 0.2 0.2 or 1.0 E.U./dL   Nitrite, UA Negative Negative   Leukocytes,  UA Trace (A) Negative       Assessment & Plan:   1. Annual physical exam   2. Screening for cardiovascular, respiratory, and genitourinary diseases   3. Screening for deficiency anemia   4. Screening for thyroid disorder   5. Routine screening for STI (sexually transmitted infection)   6. Class 3 severe obesity due to excess calories with serious comorbidity and body mass index (BMI) of 45.0 to 49.9 in adult ([place])   7. Essential hypertension   8. Hyperlipidemia with target low density lipoprotein (LDL) cholesterol less than 130 mg/dL   9. Screening for diabetes mellitus   10. Venous (peripheral) insufficiency     Orders Placed This Encounter  Procedures  . Lipid panel    Order Specific Question:   Has the patient fasted?    Answer:   Yes  . Comprehensive metabolic panel    Order Specific Question:   Has the patient fasted?    Answer:   Yes  . CBC with Differential/Platelet  . TSH  . Hemoglobin A1c  . POCT urinalysis dipstick  . EKG 12-Lead    Meds ordered this encounter  Medications  . furosemide (LASIX) 40 MG tablet    Sig: Take 2 tablets (80 mg total) by mouth 2 (two) times daily.    Dispense:  360 tablet    Refill:  0  . potassium chloride SA (K-DUR,KLOR-CON) 20 MEQ tablet    Sig: Take 2 tablets (40 mEq total) by mouth 2 (two) times daily.    Dispense:  360 tablet    Refill:  0  . carvedilol (COREG) 6.25 MG tablet    Sig: Take 1 tablet (6.25 mg total) by mouth 2 (two) times daily with a meal.    Dispense:  180 tablet    Refill:  0    I personally performed the services described in this documentation, which was scribed in my presence. The recorded information has been reviewed and considered, and addended by me as needed.  [name], M.D.  Primary Care at [facility] [address] [place], [place] [address] [phone] phone [phone] fax  11/02/17 10:04 PM

## 2017-08-03 LAB — COMPREHENSIVE METABOLIC PANEL
ALT: 40 IU/L (ref 0–44)
AST: 28 IU/L (ref 0–40)
Albumin/Globulin Ratio: 2.1 (ref 1.2–2.2)
Albumin: 4.6 g/dL (ref 3.5–5.5)
Alkaline Phosphatase: 77 IU/L (ref 39–117)
BUN/Creatinine Ratio: 14 (ref 9–20)
BUN: 15 mg/dL (ref 6–24)
Bilirubin Total: 0.4 mg/dL (ref 0.0–1.2)
CO2: 25 mmol/L (ref 20–29)
Calcium: 9.9 mg/dL (ref 8.7–10.2)
Chloride: 101 mmol/L (ref 96–106)
Creatinine, Ser: 1.08 mg/dL (ref 0.76–1.27)
GFR calc Af Amer: 93 mL/min/{1.73_m2} (ref >59)
GFR calc non Af Amer: 81 mL/min/{1.73_m2} (ref >59)
Globulin, Total: 2.2 g/dL (ref 1.5–4.5)
Glucose: 87 mg/dL (ref 65–99)
Potassium: 4.1 mmol/L (ref 3.5–5.2)
Sodium: 141 mmol/L (ref 134–144)
Total Protein: 6.8 g/dL (ref 6.0–8.5)

## 2017-08-03 LAB — CBC WITH DIFFERENTIAL/PLATELET
Basophils Absolute: 0 10*3/uL (ref 0.0–0.2)
Basos: 1 %
EOS (ABSOLUTE): 0.1 10*3/uL (ref 0.0–0.4)
Eos: 2 %
Hematocrit: 45.3 % (ref 37.5–51.0)
Hemoglobin: 14.9 g/dL (ref 13.0–17.7)
Immature Grans (Abs): 0 10*3/uL (ref 0.0–0.1)
Immature Granulocytes: 0 %
Lymphocytes Absolute: 1.3 10*3/uL (ref 0.7–3.1)
Lymphs: 22 %
MCH: 29.3 pg (ref 26.6–33.0)
MCHC: 32.9 g/dL (ref 31.5–35.7)
MCV: 89 fL (ref 79–97)
Monocytes Absolute: 0.7 10*3/uL (ref 0.1–0.9)
Monocytes: 13 %
Neutrophils Absolute: 3.6 10*3/uL (ref 1.4–7.0)
Neutrophils: 62 %
Platelets: 249 10*3/uL (ref 150–450)
RBC: 5.08 x10E6/uL (ref 4.14–5.80)
RDW: 14.3 % (ref 12.3–15.4)
WBC: 5.8 10*3/uL (ref 3.4–10.8)

## 2017-08-03 LAB — HEMOGLOBIN A1C
Est. average glucose Bld gHb Est-mCnc: 123 mg/dL
Hgb A1c MFr Bld: 5.9 % — ABNORMAL HIGH (ref 4.8–5.6)

## 2017-08-03 LAB — LIPID PANEL
Chol/HDL Ratio: 5.3 ratio — ABNORMAL HIGH (ref 0.0–5.0)
Cholesterol, Total: 206 mg/dL — ABNORMAL HIGH (ref 100–199)
HDL: 39 mg/dL — ABNORMAL LOW (ref >39)
LDL Calculated: 136 mg/dL — ABNORMAL HIGH (ref 0–99)
Triglycerides: 156 mg/dL — ABNORMAL HIGH (ref 0–149)
VLDL Cholesterol Cal: 31 mg/dL (ref 5–40)

## 2017-08-03 LAB — TSH: TSH: 0.895 u[IU]/mL (ref 0.450–4.500)

## 2017-11-02 DIAGNOSIS — R7303 Prediabetes: Secondary | ICD-10-CM | POA: Insufficient documentation

## 2017-11-02 DIAGNOSIS — I872 Venous insufficiency (chronic) (peripheral): Secondary | ICD-10-CM | POA: Insufficient documentation

## 2017-11-02 DIAGNOSIS — R600 Localized edema: Secondary | ICD-10-CM | POA: Insufficient documentation

## 2017-11-02 NOTE — Progress Notes (Signed)
Subjective:    Patient ID: Philip Conley, male    DOB: 1970-09-22, 47 y.o.   MRN: [id number] Chief Complaint  Patient presents with  . blood pressure and medication change    3 month f/u    HPI  HTN:  On lasix 40 bid w/ K 20 bid, olmesartan 40, and maxzide 37.5-25. Off of all ccbs due to persistent pedal edema. At last visit we added on carvedilol 6.25 bid but he has decreased to qd with meal in evening as seems to cause severe dehydration and diarrhea. Has been getting dizzy when he stops and looks down then back up (which might b e an inner ear thing.)   Does check BP outside office - was 107/75 at dentist last week.  Risk factor for OSA with large neck and uncontrolled HTN resistent to meds, pedal edema, wife reported poss apnea episodes but pt asymptomatic as far as fatigue and other physical sxs.  HLD: Last LDL increased 115 ->136 but ASCVD risk only 5.6%. (and when took into account the prior year's lower lipid panel his 10 yr ASCVD risk actually decreased to 4.7% due to worse uncontrolled BP at that time).  Pedal edema: suspect venous insufficiency.  Taken off all ccbs and persists despite lasix 40 bid (w/ K20 bid), maxzide 37.5-25, and regular use of compression socks (not quite as compliant during the summer).  Has had to stop compression socks The more he is outside, the less they work. Drinking of average of 7 bottles of water after 20 oz oj during the daty as well as poss gatorade/powerade   Pre-DM: new diagnosis made from a1c at CPE 3 mos ago - 5.8.   Might only sleep 2-3 hrs/night if he is lucky - stares up at the ceiling - but will fall asleep on the couch frequently.  The hotter the days, the more tired he is when he finally cools of. Snores and wife says he stops breathing - wears a breathe strip and a cool most - this past week has been horrible - feels like he is a brick. This week has had DOE and feels like whole body is heavy - like he has a lead bag around him that causes  pain pulling on his neck and shoulder. No orthopnea unless nose is congested Not taking anything for allergies Worst case of dry mouth several times a week despite using cool mist air.   Past Medical History:  Diagnosis Date  . Allergy    Past Surgical History:  Procedure Laterality Date  . HAND SURGERY     Growth removal    Current Outpatient Medications on File Prior to Visit  Medication Sig Dispense Refill  . Aspirin-Acetaminophen-Caffeine (EXCEDRIN MIGRAINE PO) Take by mouth as needed. Reported on 07/05/2015     No current facility-administered medications on file prior to visit.    No Known Allergies Family History  Problem Relation Age of Onset  . Heart disease Mother        mumur  . Hypertension Mother   . Lupus Father   . Stroke Father   . Heart disease Maternal Grandmother   . Lung disease Maternal Grandfather   . Heart disease Paternal Grandmother   . Stroke Paternal Grandfather    Social History   Socioeconomic History  . Marital status: Married    Spouse name: Not on file  . Number of children: 3  . Years of education: Not on file  . Highest education level: Not  on file  Occupational History  . Not on file  Social Needs  . Financial resource strain: Not on file  . Food insecurity:    Worry: Not on file    Inability: Not on file  . Transportation needs:    Medical: Not on file    Non-medical: Not on file  Tobacco Use  . Smoking status: Never Smoker  . Smokeless tobacco: Never Used  Substance and Sexual Activity  . Alcohol use: Not Currently    Frequency: Never    Comment: occ  . Drug use: Never  . Sexual activity: Yes    Birth control/protection: None    Comment: number of sex partners in the last 12 months  1  Lifestyle  . Physical activity:    Days per week: Not on file    Minutes per session: Not on file  . Stress: Not on file  Relationships  . Social connections:    Talks on phone: Not on file    Gets together: Not on file    Attends  religious service: Not on file    Active member of club or organization: Not on file    Attends meetings of clubs or organizations: Not on file    Relationship status: Not on file  Other Topics Concern  . Not on file  Social History Narrative  . Not on file   Depression screen [facility] 2/9 11/03/2017 08/02/2017 05/18/2017 01/15/2017 09/06/2016  Decreased Interest 0 0 0 0 0  Down, Depressed, Hopeless 0 0 0 0 0  PHQ - 2 Score 0 0 0 0 0     Review of Systems See hpi    Objective:   Physical Exam  Constitutional: He is oriented to person, place, and time. He appears well-developed and well-nourished. No distress.  HENT:  Head: Normocephalic and atraumatic.  Eyes: Pupils are equal, round, and reactive to light. Conjunctivae are normal. No scleral icterus.  Neck: Normal range of motion. Neck supple. No thyromegaly present.  Cardiovascular: Normal rate, regular rhythm, normal heart sounds and intact distal pulses.  2+ pitting edema bilaterally with beginning of venous stasis dermatitis changes.  Pulmonary/Chest: Effort normal and breath sounds normal. No respiratory distress.  Musculoskeletal: He exhibits no edema.  Lymphadenopathy:    He has no cervical adenopathy.  Neurological: He is alert and oriented to person, place, and time.  Skin: Skin is warm and dry. He is not diaphoretic.  Psychiatric: He has a normal mood and affect. His behavior is normal.    BP 121/80 (BP Location: Right Arm, Patient Position: Sitting, Cuff Size: Large)   Pulse 97   Temp 98.4 F (36.9 C) (Oral)   Resp 17   Ht 5' 6.26" (1.683 m)   Wt 288 lb (130.6 kg)   SpO2 96%   BMI 46.12 kg/m   STOP-BANG 5   Epworth Sleepiness Scale Assessment & Plan:  Refer to vein/vascular about venous insufficiency Refer for sleep study  1. Essential hypertension   2. Hyperlipidemia with target low density lipoprotein (LDL) cholesterol less than 130 mg/dL   3. Pedal edema   4. Venous (peripheral) insufficiency   5.  Prediabetes   6. Medication monitoring encounter   7. Witnessed episode of apnea   8. Class 3 severe obesity due to excess calories with serious comorbidity and body mass index (BMI) of 45.0 to 49.9 in adult ([place])   9. Recurrent sinusitis     Orders Placed This Encounter  Procedures  . Basic  metabolic panel    Order Specific Question:   Has the patient fasted?    Answer:   No  . Hemoglobin A1c  . Ambulatory referral to Sleep Studies    Referral Priority:   Routine    Referral Type:   Consultation    Referral Reason:   Specialty Services Required    Number of Visits Requested:   1  . Ambulatory referral to Vascular Surgery    Referral Priority:   Routine    Referral Type:   Surgical    Referral Reason:   Specialty Services Required    Requested Specialty:   Vascular Surgery    Number of Visits Requested:   1  . Ambulatory referral to ENT    Referral Priority:   Routine    Referral Type:   Consultation    Referral Reason:   Specialty Services Required    Requested Specialty:   Otolaryngology    Number of Visits Requested:   1    Meds ordered this encounter  Medications  . cetirizine (ZYRTEC) 10 MG tablet    Sig: Take 1 tablet (10 mg total) by mouth at bedtime.    Dispense:  30 tablet    Refill:  11  . pantoprazole (PROTONIX) 40 MG tablet    Sig: Take 1 tablet (40 mg total) by mouth daily.    Dispense:  30 tablet    Refill:  3  . predniSONE (DELTASONE) 20 MG tablet    Sig: Take 3 tabs qd x 3d, then 2 tabs qd x 3d then 1 tab qd x 3d.    Dispense:  18 tablet    Refill:  0  . cadexomer iodine (IODOSORB) 0.9 % gel    Sig: Apply 1 application topically daily as needed for wound care. For inflammation and blisters on legs    Dispense:  40 g    Refill:  1  . amoxicillin-clavulanate (AUGMENTIN) 875-125 MG tablet    Sig: Take 1 tablet by mouth 2 (two) times daily.    Dispense:  20 tablet    Refill:  0  . carvedilol (COREG) 6.25 MG tablet    Sig: Take 1 tablet (6.25 mg  total) by mouth daily.    Dispense:  90 tablet    Refill:  0  . furosemide (LASIX) 40 MG tablet    Sig: Take 2 tablets (80 mg total) by mouth 2 (two) times daily.    Dispense:  360 tablet    Refill:  0  . olmesartan-hydrochlorothiazide (BENICAR HCT) 40-25 MG tablet    Sig: Take 1 tablet by mouth daily.    Dispense:  90 tablet    Refill:  1    To replace olmesartan 40 which is on back-order, or ok to sub olmesartan hct 40-12.5 if needed for availability  . potassium chloride SA (K-DUR,KLOR-CON) 20 MEQ tablet    Sig: Take 2 tablets (40 mEq total) by mouth 2 (two) times daily.    Dispense:  360 tablet    Refill:  0    [name], MD, MPH [facility] [place], [place]  [phone] Office phone  [phone] Office fax   01/02/18 1:26 AM

## 2017-11-03 ENCOUNTER — Encounter: Payer: Self-pay | Admitting: Family Medicine

## 2017-11-03 ENCOUNTER — Ambulatory Visit: Payer: BLUE CROSS/BLUE SHIELD | Admitting: Family Medicine

## 2017-11-03 ENCOUNTER — Other Ambulatory Visit: Payer: Self-pay

## 2017-11-03 VITALS — BP 121/80 | HR 97 | Temp 98.4°F | Resp 17 | Ht 66.26 in | Wt 288.0 lb

## 2017-11-03 DIAGNOSIS — R600 Localized edema: Secondary | ICD-10-CM | POA: Diagnosis not present

## 2017-11-03 DIAGNOSIS — E785 Hyperlipidemia, unspecified: Secondary | ICD-10-CM | POA: Diagnosis not present

## 2017-11-03 DIAGNOSIS — I872 Venous insufficiency (chronic) (peripheral): Secondary | ICD-10-CM

## 2017-11-03 DIAGNOSIS — J329 Chronic sinusitis, unspecified: Secondary | ICD-10-CM

## 2017-11-03 DIAGNOSIS — I10 Essential (primary) hypertension: Secondary | ICD-10-CM

## 2017-11-03 DIAGNOSIS — Z5181 Encounter for therapeutic drug level monitoring: Secondary | ICD-10-CM

## 2017-11-03 DIAGNOSIS — R0681 Apnea, not elsewhere classified: Secondary | ICD-10-CM

## 2017-11-03 DIAGNOSIS — R7303 Prediabetes: Secondary | ICD-10-CM | POA: Diagnosis not present

## 2017-11-03 DIAGNOSIS — Z6841 Body Mass Index (BMI) 40.0 and over, adult: Secondary | ICD-10-CM

## 2017-11-03 MED ORDER — CETIRIZINE HCL 10 MG PO TABS: 10.0000 mg | ORAL_TABLET | Freq: Every day | ORAL | 11 refills | Status: DC

## 2017-11-03 MED ORDER — PANTOPRAZOLE SODIUM 40 MG PO TBEC: 40.0000 mg | DELAYED_RELEASE_TABLET | Freq: Every day | ORAL | 3 refills | Status: DC

## 2017-11-03 MED ORDER — FUROSEMIDE 40 MG PO TABS: 80.0000 mg | ORAL_TABLET | Freq: Two times a day (BID) | ORAL | 0 refills | Status: DC

## 2017-11-03 MED ORDER — CADEXOMER IODINE 0.9 % EX GEL: 1.0000 "application " | Freq: Every day | CUTANEOUS | 1 refills | Status: DC | PRN

## 2017-11-03 MED ORDER — POTASSIUM CHLORIDE CRYS ER 20 MEQ PO TBCR: 40.0000 meq | EXTENDED_RELEASE_TABLET | Freq: Two times a day (BID) | ORAL | 0 refills | Status: DC

## 2017-11-03 MED ORDER — PREDNISONE 20 MG PO TABS: ORAL_TABLET | ORAL | 0 refills | Status: DC

## 2017-11-03 MED ORDER — AMOXICILLIN-POT CLAVULANATE 875-125 MG PO TABS: 1.0000 | ORAL_TABLET | Freq: Two times a day (BID) | ORAL | 0 refills | Status: DC

## 2017-11-03 MED ORDER — OLMESARTAN MEDOXOMIL-HCTZ 40-25 MG PO TABS: 1.0000 | ORAL_TABLET | Freq: Every day | ORAL | 1 refills | Status: DC

## 2017-11-03 MED ORDER — CARVEDILOL 6.25 MG PO TABS: 6.2500 mg | ORAL_TABLET | Freq: Every day | ORAL | 0 refills | Status: DC

## 2017-11-03 NOTE — Patient Instructions (Addendum)
If you have lab work done today you will be contacted with your lab results within the next 2 weeks.  If you have not heard from [place] then please contact us. The fastest way to get your results is to register for My Chart.   IF you received an x-ray today, you will receive an invoice from [facility]. Please contact [facility] at [phone] with questions or concerns regarding your invoice.   IF you received labwork today, you will receive an invoice from [place]. Please contact [facility] at [phone] with questions or concerns regarding your invoice.   Our billing staff will not be able to assist you with questions regarding bills from these companies.  You will be contacted with the lab results as soon as they are available. The fastest way to get your results is to activate your My Chart account. Instructions are located on the last page of this paperwork. If you have not heard from [place] regarding the results in 2 weeks, please contact this office.     Sinusitis, Adult Sinusitis is soreness and inflammation of your sinuses. Sinuses are hollow spaces in the bones around your face. Your sinuses are located:  Around your eyes.  In the middle of your forehead.  Behind your nose.  In your cheekbones.  Your sinuses and nasal passages are lined with a stringy fluid (mucus). Mucus normally drains out of your sinuses. When your nasal tissues become inflamed or swollen, the mucus can become trapped or blocked so air cannot flow through your sinuses. This allows bacteria, viruses, and funguses to grow, which leads to infection. Sinusitis can develop quickly and last for 7?10 days (acute) or for more than 12 weeks (chronic). Sinusitis often develops after a cold. What are the causes? This condition is caused by anything that creates swelling in the sinuses or stops mucus from draining, including:  Allergies.  Asthma.  Bacterial or viral infection.  Abnormally shaped  bones between the nasal passages.  Nasal growths that contain mucus (nasal polyps).  Narrow sinus openings.  Pollutants, such as chemicals or irritants in the air.  A foreign object stuck in the nose.  A fungal infection. This is rare.  What increases the risk? The following factors may make you more likely to develop this condition:  Having allergies or asthma.  Having had a recent cold or respiratory tract infection.  Having structural deformities or blockages in your nose or sinuses.  Having a weak immune system.  Doing a lot of swimming or diving.  Overusing nasal sprays.  Smoking.  What are the signs or symptoms? The main symptoms of this condition are pain and a feeling of pressure around the affected sinuses. Other symptoms include:  Upper toothache.  Earache.  Headache.  Bad breath.  Decreased sense of smell and taste.  A cough that may get worse at night.  Fatigue.  Fever.  Thick drainage from your nose. The drainage is often green and it may contain pus (purulent).  Stuffy nose or congestion.  Postnasal drip. This is when extra mucus collects in the throat or back of the nose.  Swelling and warmth over the affected sinuses.  Sore throat.  Sensitivity to light.  How is this diagnosed? This condition is diagnosed based on symptoms, a medical history, and a physical exam. To find out if your condition is acute or chronic, your health care provider may:  Look in your nose for signs of nasal polyps.  Tap over the affected sinus  to check for signs of infection.  View the inside of your sinuses using an imaging device that has a light attached (endoscope).  If your health care provider suspects that you have chronic sinusitis, you may also:  Be tested for allergies.  Have a sample of mucus taken from your nose (nasal culture) and checked for bacteria.  Have a mucus sample examined to see if your sinusitis is related to an allergy.  If  your sinusitis does not respond to treatment and it lasts longer than 8 weeks, you may have an MRI or CT scan to check your sinuses. These scans also help to determine how severe your infection is. In rare cases, a bone biopsy may be done to rule out more serious types of fungal sinus disease. How is this treated? Treatment for sinusitis depends on the cause and whether your condition is chronic or acute. If a virus is causing your sinusitis, your symptoms will go away on their own within 10 days. You may be given medicines to relieve your symptoms, including:  Topical nasal decongestants. They shrink swollen nasal passages and let mucus drain from your sinuses.  Antihistamines. These drugs block inflammation that is triggered by allergies. This can help to ease swelling in your nose and sinuses.  Topical nasal corticosteroids. These are nasal sprays that ease inflammation and swelling in your nose and sinuses.  Nasal saline washes. These rinses can help to get rid of thick mucus in your nose.  If your condition is caused by bacteria, you will be given an antibiotic medicine. If your condition is caused by a fungus, you will be given an antifungal medicine. Surgery may be needed to correct underlying conditions, such as narrow nasal passages. Surgery may also be needed to remove polyps. Follow these instructions at home: Medicines  Take, use, or apply over-the-counter and prescription medicines only as told by your health care provider. These may include nasal sprays.  If you were prescribed an antibiotic medicine, take it as told by your health care provider. Do not stop taking the antibiotic even if you start to feel better. Hydrate and Humidify  Drink enough water to keep your urine clear or pale yellow. Staying hydrated will help to thin your mucus.  Use a cool mist humidifier to keep the humidity level in your home above 50%.  Inhale steam for 10-15 minutes, 3-4 times a day or as told  by your health care provider. You can do this in the bathroom while a hot shower is running.  Limit your exposure to cool or dry air. Rest  Rest as much as possible.  Sleep with your head raised (elevated).  Make sure to get enough sleep each night. General instructions  Apply a warm, moist washcloth to your face 3-4 times a day or as told by your health care provider. This will help with discomfort.  Wash your hands often with soap and water to reduce your exposure to viruses and other germs. If soap and water are not available, use hand sanitizer.  Do not smoke. Avoid being around people who are smoking (secondhand smoke).  Keep all follow-up visits as told by your health care provider. This is important. Contact a health care provider if:  You have a fever.  Your symptoms get worse.  Your symptoms do not improve within 10 days. Get help right away if:  You have a severe headache.  You have persistent vomiting.  You have pain or swelling around your face  or eyes.  You have vision problems.  You develop confusion.  Your neck is stiff.  You have trouble breathing. This information is not intended to replace advice given to you by your health care provider. Make sure you discuss any questions you have with your health care provider. Document Released: 03/01/2005 Document Revised: 10/26/2015 Document Reviewed: 12/25/2014 Elsevier Interactive Patient Education  2018 [employer].  Chronic Venous Insufficiency Chronic venous insufficiency, also called venous stasis, is a condition that prevents blood from being pumped effectively through the veins in your legs. Blood may no longer be pumped effectively from the legs back to the heart. This condition can range from mild to severe. With proper treatment, you should be able to continue with an active life. What are the causes? Chronic venous insufficiency occurs when the vein walls become stretched, weakened, or damaged, or  when valves within the vein are damaged. Some common causes of this include:  High blood pressure inside the veins (venous hypertension).  Increased blood pressure in the leg veins from long periods of sitting or standing.  A blood clot that blocks blood flow in a vein (deep vein thrombosis, DVT).  Inflammation of a vein (phlebitis) that causes a blood clot to form.  Tumors in the pelvis that cause blood to back up.  What increases the risk? The following factors may make you more likely to develop this condition:  Having a family history of this condition.  Obesity.  Pregnancy.  Living without enough physical activity or exercise (sedentary lifestyle).  Smoking.  Having a job that requires long periods of standing or sitting in one place.  Being a certain age. Women in their 46s and 50s and men in their 29s are more likely to develop this condition.  What are the signs or symptoms? Symptoms of this condition include:  Veins that are enlarged, bulging, or twisted (varicose veins).  Skin breakdown or ulcers.  Reddened or discolored skin on the front of the leg.  Brown, smooth, tight, and painful skin just above the ankle, usually on the inside of the leg (lipodermatosclerosis).  Swelling.  How is this diagnosed? This condition may be diagnosed based on:  Your medical history.  A physical exam.  Tests, such as: ? A procedure that creates an image of a blood vessel and nearby organs and provides information about blood flow through the blood vessel (duplex ultrasound). ? A procedure that tests blood flow (plethysmography). ? A procedure to look at the veins using X-ray and dye (venogram).  How is this treated? The goals of treatment are to help you return to an active life and to minimize pain or disability. Treatment depends on the severity of your condition, and it may include:  Wearing compression stockings. These can help relieve symptoms and help prevent  your condition from getting worse. However, they do not cure the condition.  Sclerotherapy. This is a procedure involving an injection of a material that "dissolves" damaged veins.  Surgery. This may involve: ? Removing a diseased vein (vein stripping). ? Cutting off blood flow through the vein (laser ablation surgery). ? Repairing a valve.  Follow these instructions at home:  Wear compression stockings as told by your health care provider. These stockings help to prevent blood clots and reduce swelling in your legs.  Take over-the-counter and prescription medicines only as told by your health care provider.  Stay active by exercising, walking, or doing different activities. Ask your health care provider what activities are safe  for you and how much exercise you need.  Drink enough fluid to keep your urine clear or pale yellow.  Do not use any products that contain nicotine or tobacco, such as cigarettes and e-cigarettes. If you need help quitting, ask your health care provider.  Keep all follow-up visits as told by your health care provider. This is important. Contact a health care provider if:  You have redness, swelling, or more pain in the affected area.  You see a red streak or line that extends up or down from the affected area.  You have skin breakdown or a loss of skin in the affected area, even if the breakdown is small.  You get an injury in the affected area. Get help right away if:  You get an injury and an open wound in the affected area.  You have severe pain that does not get better with medicine.  You have sudden numbness or weakness in the foot or ankle below the affected area, or you have trouble moving your foot or ankle.  You have a fever and you have worse or persistent symptoms.  You have chest pain.  You have shortness of breath. Summary  Chronic venous insufficiency, also called venous stasis, is a condition that prevents blood from being pumped  effectively through the veins in your legs.  Chronic venous insufficiency occurs when the vein walls become stretched, weakened, or damaged, or when valves within the vein are damaged.  Treatment for this condition depends on how severe your condition is, and it may involve wearing compression stockings or having a procedure.  Make sure you stay active by exercising, walking, or doing different activities. Ask your health care provider what activities are safe for you and how much exercise you need. This information is not intended to replace advice given to you by your health care provider. Make sure you discuss any questions you have with your health care provider. Document Released: 07/05/2006 Document Revised: 01/19/2016 Document Reviewed: 01/19/2016 Elsevier Interactive Patient Education  2017 [employer].

## 2017-11-04 LAB — BASIC METABOLIC PANEL
BUN/Creatinine Ratio: 13 (ref 9–20)
BUN: 16 mg/dL (ref 6–24)
CO2: 27 mmol/L (ref 20–29)
Calcium: 9.4 mg/dL (ref 8.7–10.2)
Chloride: 96 mmol/L (ref 96–106)
Creatinine, Ser: 1.2 mg/dL (ref 0.76–1.27)
GFR calc Af Amer: 82 mL/min/{1.73_m2} (ref >59)
GFR calc non Af Amer: 71 mL/min/{1.73_m2} (ref >59)
Glucose: 211 mg/dL — ABNORMAL HIGH (ref 65–99)
Potassium: 3.7 mmol/L (ref 3.5–5.2)
Sodium: 139 mmol/L (ref 134–144)

## 2017-11-04 LAB — HEMOGLOBIN A1C
Est. average glucose Bld gHb Est-mCnc: 128 mg/dL
Hgb A1c MFr Bld: 6.1 % — ABNORMAL HIGH (ref 4.8–5.6)

## 2017-11-15 ENCOUNTER — Other Ambulatory Visit: Payer: Self-pay

## 2017-11-15 DIAGNOSIS — I872 Venous insufficiency (chronic) (peripheral): Secondary | ICD-10-CM

## 2018-01-10 ENCOUNTER — Ambulatory Visit (HOSPITAL_COMMUNITY)
Admission: RE | Admit: 2018-01-10 | Discharge: 2018-01-10 | Disposition: A | Discharge status: Home / Self Care | Payer: BLUE CROSS/BLUE SHIELD | Source: Ambulatory Visit | Attending: Vascular Surgery | Admitting: Vascular Surgery

## 2018-01-10 ENCOUNTER — Other Ambulatory Visit: Payer: Self-pay

## 2018-01-10 ENCOUNTER — Ambulatory Visit (INDEPENDENT_AMBULATORY_CARE_PROVIDER_SITE_OTHER): Payer: BLUE CROSS/BLUE SHIELD | Admitting: Vascular Surgery

## 2018-01-10 ENCOUNTER — Encounter: Payer: Self-pay | Admitting: Vascular Surgery

## 2018-01-10 VITALS — BP 146/92 | HR 83 | Temp 97.5°F | Resp 18 | Ht 66.0 in | Wt 290.0 lb

## 2018-01-10 DIAGNOSIS — I872 Venous insufficiency (chronic) (peripheral): Secondary | ICD-10-CM | POA: Insufficient documentation

## 2018-01-10 DIAGNOSIS — R600 Localized edema: Secondary | ICD-10-CM | POA: Diagnosis not present

## 2018-01-10 NOTE — Progress Notes (Addendum)
Requested by:  Shawnee Knapp, [address] [place], [place] [id number]  Reason for consultation: BLE edema    History of Present Illness   Philip Conley is a 46 y.o. (1970/12/04) male who presents with chief complaint: bilateral lower extremity edema.  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  Overnight edema resolves however quickly comes back as soon as his feet hit the floor in the morning.  He has tried compression in the past however believes the compression is cutting off his circulation and is not compliant with regular use.  He tries to elevate his legs when he gets home from work however admittedly can work on this more.  He denies any history of venous ulcerations however does say that wounds of his shins are usually slow to heal and can have several days of clear fluid draining from them.  He is on 40 mg of Lasix twice daily prescribed by his PCP.  He denies any history of DVT.  He denies any smoking history.  He denies any history of CAD or heart failure.    Past Medical History:  Diagnosis Date  . Allergy     Past Surgical History:  Procedure Laterality Date  . HAND SURGERY     Growth removal     Social History   Socioeconomic History  . Marital status: Married    Spouse name: Not on file  . Number of children: 3  . Years of education: Not on file  . Highest education level: Not on file  Occupational History  . Not on file  Social Needs  . Financial resource strain: Not on file  . Food insecurity:    Worry: Not on file    Inability: Not on file  . Transportation needs:    Medical: Not on file    Non-medical: Not on file  Tobacco Use  . Smoking status: Never Smoker  . Smokeless tobacco: Never Used  Substance and Sexual Activity  . Alcohol use: Not Currently    Frequency: Never    Comment: occ  . Drug use:  Never  . Sexual activity: Yes    Birth control/protection: None    Comment: number of sex partners in the last 12 months  1  Lifestyle  . Physical activity:    Days per week: Not on file    Minutes per session: Not on file  . Stress: Not on file  Relationships  . Social connections:    Talks on phone: Not on file    Gets together: Not on file    Attends religious service: Not on file    Active member of club or organization: Not on file    Attends meetings of clubs or organizations: Not on file    Relationship status: Not on file  . Intimate partner violence:    Fear of current or ex partner: Not on file    Emotionally abused: Not on file    Physically abused: Not on file    Forced sexual activity: Not on file  Other Topics Concern  . Not on file  Social History Narrative  . Not on file    Family History  Problem Relation Age of Onset  . Heart disease Mother        mumur  . Hypertension  Mother   . Lupus Father   . Stroke Father   . Heart disease Maternal Grandmother   . Lung disease Maternal Grandfather   . Heart disease Paternal Grandmother   . Stroke Paternal Grandfather     Current Outpatient Medications  Medication Sig Dispense Refill  . Aspirin-Acetaminophen-Caffeine (EXCEDRIN MIGRAINE PO) Take by mouth as needed. Reported on 07/05/2015    . carvedilol (COREG) 6.25 MG tablet Take 1 tablet (6.25 mg total) by mouth daily. 90 tablet 0  . cetirizine (ZYRTEC) 10 MG tablet Take 1 tablet (10 mg total) by mouth at bedtime. 30 tablet 11  . furosemide (LASIX) 40 MG tablet Take 2 tablets (80 mg total) by mouth 2 (two) times daily. 360 tablet 0  . olmesartan-hydrochlorothiazide (BENICAR HCT) 40-25 MG tablet Take 1 tablet by mouth daily. 90 tablet 1  . pantoprazole (PROTONIX) 40 MG tablet Take 1 tablet (40 mg total) by mouth daily. 30 tablet 3  . potassium chloride SA (K-DUR,KLOR-CON) 20 MEQ tablet Take 2 tablets (40 mEq total) by mouth 2 (two) times daily. 360 tablet 0  .  amoxicillin-clavulanate (AUGMENTIN) 875-125 MG tablet Take 1 tablet by mouth 2 (two) times daily. (Patient not taking: Reported on 01/10/2018) 20 tablet 0  . cadexomer iodine (IODOSORB) 0.9 % gel Apply 1 application topically daily as needed for wound care. For inflammation and blisters on legs (Patient not taking: Reported on 01/10/2018) 40 g 1  . predniSONE (DELTASONE) 20 MG tablet Take 3 tabs qd x 3d, then 2 tabs qd x 3d then 1 tab qd x 3d. (Patient not taking: Reported on 01/10/2018) 18 tablet 0   No current facility-administered medications for this visit.     No Known Allergies  REVIEW OF SYSTEMS (negative unless checked):   Cardiac:  []  Chest pain or chest pressure? []  Shortness of breath upon activity? []  Shortness of breath when lying flat? []  Irregular heart rhythm?  Vascular:  []  Pain in calf, thigh, or hip brought on by walking? []  Pain in feet at night that wakes you up from your sleep? []  Blood clot in your veins? [x]  Leg swelling?  Pulmonary:  []  Oxygen at home? []  Productive cough? []  Wheezing?  Neurologic:  []  Sudden weakness in arms or legs? []  Sudden numbness in arms or legs? []  Sudden onset of difficult speaking or slurred speech? []  Temporary loss of vision in one eye? []  Problems with dizziness?  Gastrointestinal:  []  Blood in stool? []  Vomited blood?  Genitourinary:  []  Burning when urinating? []  Blood in urine?  Psychiatric:  []  Major depression  Hematologic:  []  Bleeding problems? []  Problems with blood clotting?  Dermatologic:  []  Rashes or ulcers?  Constitutional:  []  Fever or chills?  Ear/Nose/Throat:  []  Change in hearing? []  Nose bleeds? []  Sore throat?  Musculoskeletal:  []  Back pain? [x]  Joint pain? []  Muscle pain?   Physical Examination     Vitals:   01/10/18 1416  BP: (!) 146/92  Pulse: 83  Resp: 18  Temp: (!) 97.5 F (36.4 C)  TempSrc: Oral  SpO2: 97%  Weight: 290 lb (131.5 kg)  Height: 5\' 6"  (1.676 m)     Body mass index is 46.81 kg/m.  General Alert, O x 3, Obese, NAD  Head [place]/AT,    Neck Supple, mid-line trachea,    Pulmonary Sym exp, good B air movt, CTA B  Cardiac RRR, Nl S1, S2,  Vascular Vessel Right Left  Radial Palpable Palpable  Brachial Palpable Palpable  Aorta Not palpable N/A  Femoral Not examined Not examined  Popliteal Not palpable Not palpable  PT Palpable Palpable  DP Palpable Palpable    Gastro- intestinal soft, non-distended, non-tender to palpation, exam limited due to large body habitus  Musculo- skeletal M/S 5/5 throughout  , Extremities without ischemic changes  , Symmetrical bilateral lower extremity edema with a prominent sock line; no stasis pigmentation or induration, No visible varicosities , No Lipodermatosclerosis present  Neurologic Cranial nerves 2-12 intact ,  Psychiatric Judgement intact, Mood & affect appropriate for pt's clinical situation  Dermatologic See M/S exam for extremity exam, No rashes otherwise noted  Lymphatic  Palpable lymph nodes: None    Non-invasive Vascular Imaging   BLE Venous Insufficiency Duplex (01/10/18):   RLE:   Negative for DVT and SVT,   Negative for GSV reflux,   Negative for SSV reflux,  CFV deep venous reflux  LLE:  Negative for DVT and SVT,   Negative for GSV reflux,   Negative for SSV reflux,  CFV deep venous reflux   Medical Decision Making   Philip Conley is a 47 y.o. male who presents with: BLE chronic venous insufficiency   Based on the patient's history and examination, I recommend: daily use of 20-13mm Hg of knee high compression stockings in addition to weight loss, avoiding salty foods, and making an effort to elevate his legs during the day.  Venous reflux study is negative for any superficial venous reflux and only shows reflux and is deep system in bilateral common femoral veins  Follow-up regularly with PCP for management of chronic medical conditions including hypertension,  hyperlipidemia, and prediabetes  This patient was seen in conjunction with Dr. Carlis Abbott who will be the cosigner of my office visit note   Dagoberto Ligas PA-C [facility] Office: [phone]  01/10/2018, 2:47 PM  I have seen and evaluated the patient. I agree with the PA note as documented above. Progressive bilateral leg swelling.  Venous duplex only showed reflux in CFV bilaterally and discussed we do not offer venous intervention for deep venous reflux.  Will fit him appropriately for knee high compression (dont think he can tolerate thigh high).  Will need conservative measures.  Follow-up PRN.   Marty Heck, MD [facility] Office: [phone] Pager: [phone]

## 2018-01-11 ENCOUNTER — Encounter: Payer: Self-pay | Admitting: Neurology

## 2018-01-11 ENCOUNTER — Ambulatory Visit (INDEPENDENT_AMBULATORY_CARE_PROVIDER_SITE_OTHER): Payer: BLUE CROSS/BLUE SHIELD | Admitting: Neurology

## 2018-01-11 VITALS — BP 151/97 | HR 80 | Ht 66.0 in | Wt 292.0 lb

## 2018-01-11 DIAGNOSIS — G4709 Other insomnia: Secondary | ICD-10-CM

## 2018-01-11 DIAGNOSIS — M25471 Effusion, right ankle: Secondary | ICD-10-CM | POA: Diagnosis not present

## 2018-01-11 DIAGNOSIS — Z6841 Body Mass Index (BMI) 40.0 and over, adult: Secondary | ICD-10-CM

## 2018-01-11 DIAGNOSIS — J0120 Acute ethmoidal sinusitis, unspecified: Secondary | ICD-10-CM | POA: Diagnosis not present

## 2018-01-11 DIAGNOSIS — G4733 Obstructive sleep apnea (adult) (pediatric): Secondary | ICD-10-CM

## 2018-01-11 DIAGNOSIS — G4726 Circadian rhythm sleep disorder, shift work type: Secondary | ICD-10-CM

## 2018-01-11 DIAGNOSIS — M25472 Effusion, left ankle: Secondary | ICD-10-CM

## 2018-01-11 DIAGNOSIS — E661 Drug-induced obesity: Secondary | ICD-10-CM | POA: Diagnosis not present

## 2018-01-11 DIAGNOSIS — R5383 Other fatigue: Secondary | ICD-10-CM

## 2018-01-11 NOTE — Patient Instructions (Signed)
Sleep Study  You should be scheduled for a sleep study, either beginnig at 8 or 9 Pm, and You will leave next morning between 5:00 a.m -6:00 a.m.  The sleep study consists of a recording of your brain waves (EEG). Breathing, heart rate and rhythm (ECG), oxygen level, eye movement, and leg movement.  The technician will glue or or paste several electrodes to your scalp, face, chest and legs.  You will have belts around your chest and abdomen to record breathing and a finger clasp to check blood oxygen levels.  A tube at your mouth and nose will detect airflow.  There are no needle sticks or painful procedures of any sort.  You will have your own room, and we will make every effort to attend to your comfort and privacy.  Please prepare for your study by the following steps:   Please avoid coffee, tea, soda, chocolate and other caffeine foods or beverages after 12:00 noon on the day of your sleep study.   You must arrive with clean (no oils), conditioners or make up, and please make sure that you wash your hair to ensure that your hair and scalp are clean, dry and free of any hair extensions on the day of your study.  This will help to get a good reading of study.  Please try not to nap on the day of your study.  Please bring a list of all your medications.  Bring any medications that you might need during the time you are within the laboratory, including insulin, sleeping pills, pain medication and anxiety medications.  Bring snacks, water or juice  Please bring clothes to sleep in and your normal overnight bag.  Please leave valuable at home, as we will not be responsible for any lost items.  If you have any further questions, please feel free to call our office. Thank you

## 2018-01-11 NOTE — Progress Notes (Signed)
SLEEP MEDICINE CLINIC   Provider:  Larey Seat, [place] D   Primary Care Physician:  Shawnee Knapp, MD   Referring Provider: Shawnee Knapp, MD   Chief Complaint  Patient presents with  . New Patient (Initial Visit)    RM 11, alone. Pt has never had a sleep study but does endorse snoring. pt has not seen ENT.    HPI:  Philip Conley is a 47 y.o. male Patient ,who is  seen here on 01-11-2018 in a referral  from Dr. [name] for an insomnia evaluation.   Chief complaint according to patient : Philip Conley is a 47 year old right-handed Caucasian married gentleman who states that he used to work third shift until 6 years ago.  Over the last he never recovered his ability to sleep through the night even when he changed today shifts.  He continues to be seriously sleep deprived, some nights he may only get 2 hours of sleep.  He says he stares up at the ceiling but he will have no trouble falling asleep in front of the TV or on the couch.  He does prefer a cool quiet and dark environment.  He usually feels tired enough to go to bed but then is surprised that he cannot necessarily sleep.  His wife has noticed that he snores and that he stops breathing.  He has felt heavy, weak and has severely swollen ankles. He wakes up having a very dry mouth in the mornings in spite of using a humidifier in the bedroom.  He is not refreshed and restored when he wakes up.   Sleep habits are as follows: dinner time 5-7 pm, and after dinner he is walking the dogs for an hour. May watch Tv in the living room. Will go to bed at 10 Pm, has a routine - hot shower, Tv is not on, clocks turned around, no electronics.  Runs a box fan in the bedroom.  His preferred position is sleeping on his left side, on 2 pillows. Breath strips, sleeps with elevated feet to prevent edema.  His mind is not racing - he is relaxed, but he cant easily sleep he will wake up at 2.30-3 AM for nocturia, and often can go right back to sleep- but  not of the same quality.  Rises 4.30 AM every work day. He doesn't feel better if he sleeps in later than 6.30  AM.    Medical history: tonsillectomy, no history of TBI, no history of neck injury but cervical DDD. Morbid Obesity, just being diagnosed as diabetic. Ankle edema.      Family sleep history:  None of apnea.    Social history: married, 2 children - 14 and 41 year old daughter and 27 year old son.  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 All live at home and all in school , he is a  former night shift worker until late 2013 , 4 dogs . Non smoker, no vaping. ETOH- socially 1-2 a year. Caffeine - excedrin migraine daily 2 tab -  soda - no coffee, tea.   Review of Systems: Out of a complete 14 system review, the patient complains of only the following symptoms, and all other reviewed systems are negative. ,morning and sinus migraines, nasal congestion- , most HA in daytime, dizziness, weight gain, fluid retention, nocturia,  Reports " inner ear " , episodic vertigo.   Epworth Sleepiness Score - 4/ 24  , Fatigue severity score 14/ 63   ,  depression score n/a   Social History   Socioeconomic History  . Marital status: Married    Spouse name: Not on file  . Number of children: 3  . Years of education: Not on file  . Highest education level: Not on file  Occupational History  . Not on file  Social Needs  . Financial resource strain: Not on file  . Food insecurity:    Worry: Not on file    Inability: Not on file  . Transportation needs:    Medical: Not on file    Non-medical: Not on file  Tobacco Use  . Smoking status: Never Smoker  . Smokeless tobacco: Never Used  Substance and Sexual Activity  . Alcohol use: Not Currently    Frequency: Never    Comment: occ  . Drug use: Never  . Sexual activity: Yes    Birth control/protection: None    Comment: number of sex partners in the last 12 months  1  Lifestyle  . Physical activity:    Days per week: Not on  file    Minutes per session: Not on file  . Stress: Not on file  Relationships  . Social connections:    Talks on phone: Not on file    Gets together: Not on file    Attends religious service: Not on file    Active member of club or organization: Not on file    Attends meetings of clubs or organizations: Not on file    Relationship status: Not on file  . Intimate partner violence:    Fear of current or ex partner: Not on file    Emotionally abused: Not on file    Physically abused: Not on file    Forced sexual activity: Not on file  Other Topics Concern  . Not on file  Social History Narrative  . Not on file    Family History  Problem Relation Age of Onset  . Heart disease Mother        mumur  . Hypertension Mother   . Lupus Father   . Stroke Father   . Heart disease Maternal Grandmother   . Lung disease Maternal Grandfather   . Heart disease Paternal Grandmother   . Stroke Paternal Grandfather     Past Medical History:  Diagnosis Date  . Allergy     Past Surgical History:  Procedure Laterality Date  . HAND SURGERY     Growth removal     Current Outpatient Medications  Medication Sig Dispense Refill  . Aspirin-Acetaminophen-Caffeine (EXCEDRIN MIGRAINE PO) Take by mouth as needed. Reported on 07/05/2015    . carvedilol (COREG) 6.25 MG tablet Take 1 tablet (6.25 mg total) by mouth daily. 90 tablet 0  . cetirizine (ZYRTEC) 10 MG tablet Take 1 tablet (10 mg total) by mouth at bedtime. 30 tablet 11  . furosemide (LASIX) 40 MG tablet Take 2 tablets (80 mg total) by mouth 2 (two) times daily. 360 tablet 0  . olmesartan-hydrochlorothiazide (BENICAR HCT) 40-25 MG tablet Take 1 tablet by mouth daily. 90 tablet 1  . pantoprazole (PROTONIX) 40 MG tablet Take 1 tablet (40 mg total) by mouth daily. 30 tablet 3  . potassium chloride SA (K-DUR,KLOR-CON) 20 MEQ tablet Take 2 tablets (40 mEq total) by mouth 2 (two) times daily. 360 tablet 0   No current facility-administered  medications for this visit.     Allergies as of 01/11/2018  . (No Known Allergies)    Vitals: BP Marland Kitchen)  151/97   Pulse 80   Ht 5\' 6"  (1.676 m)   Wt 292 lb (132.5 kg)   BMI 47.13 kg/m  Last Weight:  Wt Readings from Last 1 Encounters:  01/11/18 292 lb (132.5 kg)   QIH:KVQQ mass index is 47.13 kg/m.     Last Height:   Ht Readings from Last 1 Encounters:  01/11/18 5\' 6"  (1.676 m)    Physical exam:  General: The patient is awake, alert and appears not in acute distress. Head: Normocephalic, atraumatic. Neck is supple. Mallampati 5- high grade.  neck circumference: 19 "  Nasal airflow is  severely congested -  There is bloody discharge for the right nasion, both opening show severe swelling.-  Retrognathia is not seen, but a small lower jaw, crowded dentition. .  Cardiovascular:  Regular rate and rhythm, without  murmurs or carotid bruit, and without distended neck veins. Respiratory: Lungs are clear to auscultation. Skin:  Without evidence of edema, or rash Trunk: BMI is 74 . 13 . The patient's posture is relaxed.   Neurologic exam : The patient is awake and alert, oriented to place and time.   Memory subjective  described as intact.  Attention span & concentration ability appears normal.  Speech is fluent,  with dysphonia.  Mood and affect are appropriate.  Cranial nerves: Pupils are equal and briskly reactive to light. Funduscopic exam without evidence of pallor or edema, but ciliary injectioon. Extraocular movements  in vertical and horizontal planes intact and without nystagmus.  Visual fields by finger perimetry are intact. Hearing to finger rub intact.  Facial sensation intact to fine touch.  Facial motor strength is symmetric and tongue is  midline. Shoulder shrug was symmetrical.  There is a click over the left shoulder with rotation.   Motor exam:  Normal tone, muscle bulk and symmetric strength in all extremities. He reports loss of grip strength, suggested to be  related to cervical DDD.   Sensory:  Fine touch, pinprick and vibration were tested in all extremities. The whole hand will go numb when his neck is "stiff" . Proprioception tested in the upper extremities was normal. Coordination: Finger-to-nose maneuver normal without evidence of ataxia, dysmetria or tremor.  Gait and station: Patient walks without assistive device and is able unassisted to climb up to the exam table. Strength within normal limits.  Stance is stable and normal.   Deep tendon reflexes: in the  upper and lower extremities are symmetric and intact.    Assessment:  After physical and neurologic examination, review of laboratory studies,  Personal review of imaging studies, reports of other /same  Imaging studies, results of polysomnography and / or neurophysiology testing and pre-existing records as far as provided in visit., my assessment is   1)  OSA is very likely present , risk factors include a large neck circumference, a very narrow upper airway with functional macroglossia, a crowded lower jaw, a body mass index as category 3 morbid obesity, and recently developed cofactors such as elevated blood glucose levels, all are risk factors to develop obstructive sleep apnea and also in case of hypertension, and blood glucose levels hard to correct without the treatment of obstructive sleep apnea.    2)of his main risk factors is the congested nose and the possibly deviated nasal septum and he has waited for an ENT appointment for quite a while.  I recommend that he will see an ENT physician in [place] to get this done more quickly.  He has  developed ankle edema and needs to take Lasix in addition to another fluid pill suggesting a diastolic congestive heart dysfunction.   His wife has noted him to snore and to stop breathing so I am very sure that we will find obstructive sleep apnea.  My problem will be how to treat the apnea in a patient with an obstructed nasal airway ? .  3)  obesity- medial weight management is highly recommended.   4) has some trouble to sleep since he was a third shift worker-  Entrained insomnia. Establishing regular sleep habits, eliminating caffeine and not taking diuretics at night can help. I would consider trazodone as a sleep aid.    The patient was advised of the nature of the diagnosed disorder , the treatment options and the  risks for general health and wellness arising from not treating the condition.   I spent more than 55 minutes of face to face time with the patient.  Greater than 50% of time was spent in counseling and coordination of care. We have discussed the diagnosis and differential and I answered the patient's questions.    Plan:  Treatment plan and additional workup :  SPLIT night PSG , attended sleep study in a patient with development of ankle edema, DM and HTN in a short time. He gained ( fluid/ ) weight since may 264 to 292 pounds now.  Was on prednisone and is no longer, but continues to gain.  Needs to see ENT, I am happy to refer to Dr. Ernesto Rutherford .  Hypoxemia or Co2 retention risk is very high.    Larey Seat, MD 04/25/1550, 0:80 PM  Certified in Neurology by ABPN Certified in [place] by [facility] [address], [place], [place] [address]

## 2018-01-27 DIAGNOSIS — H608X3 Other otitis externa, bilateral: Secondary | ICD-10-CM | POA: Insufficient documentation

## 2018-01-27 DIAGNOSIS — R0683 Snoring: Secondary | ICD-10-CM | POA: Diagnosis not present

## 2018-01-27 DIAGNOSIS — J3489 Other specified disorders of nose and nasal sinuses: Secondary | ICD-10-CM | POA: Diagnosis not present

## 2018-01-27 DIAGNOSIS — R040 Epistaxis: Secondary | ICD-10-CM | POA: Diagnosis not present

## 2018-01-29 ENCOUNTER — Other Ambulatory Visit: Payer: Self-pay | Admitting: Family Medicine

## 2018-01-31 NOTE — Telephone Encounter (Signed)
Requested Prescriptions  Pending Prescriptions Disp Refills  . furosemide (LASIX) 40 MG tablet [Pharmacy Med Name: FUROSEMIDE 40MG  TABLETS] 360 tablet 0    Sig: TAKE 2 TABLETS(80 MG) BY MOUTH TWICE DAILY     Cardiovascular:  Diuretics - Loop Failed - 01/29/2018  1:39 PM      Failed - Last BP in normal range    BP Readings from Last 1 Encounters:  01/11/18 (!) 151/97         Passed - K in normal range and within 360 days    Potassium  Date Value Ref Range Status  11/03/2017 3.7 3.5 - 5.2 mmol/L Final         Passed - Ca in normal range and within 360 days    Calcium  Date Value Ref Range Status  11/03/2017 9.4 8.7 - 10.2 mg/dL Final         Passed - Na in normal range and within 360 days    Sodium  Date Value Ref Range Status  11/03/2017 139 134 - 144 mmol/L Final         Passed - Cr in normal range and within 360 days    Creat  Date Value Ref Range Status  04/07/2015 1.00 0.60 - 1.35 mg/dL Final   Creatinine, Ser  Date Value Ref Range Status  11/03/2017 1.20 0.76 - 1.27 mg/dL Final         Passed - Valid encounter within last 6 months    Recent Outpatient Visits          2 months ago Essential hypertension   [facility], Laurey Arrow, MD   6 months ago Annual physical exam   [facility], Laurey Arrow, MD   8 months ago Acute non-recurrent maxillary sinusitis   [facility], Laurey Arrow, MD   1 year ago Essential hypertension   [facility], Laurey Arrow, MD   1 year ago Essential hypertension   [facility], Laurey Arrow, MD      Future Appointments            In 1 month Shawnee Knapp, MD [facility] at [place], [facility]         . furosemide (LASIX) 40 MG tablet [Pharmacy Med Name: FUROSEMIDE 40MG  TABLETS] 360 tablet 0    Sig: TAKE 2 TABLETS(80 MG) BY MOUTH TWICE DAILY     Cardiovascular:  Diuretics - Loop Failed - 01/29/2018  1:39 PM      Failed - Last BP in normal range    BP Readings from Last 1 Encounters:   01/11/18 (!) 151/97         Passed - K in normal range and within 360 days    Potassium  Date Value Ref Range Status  11/03/2017 3.7 3.5 - 5.2 mmol/L Final         Passed - Ca in normal range and within 360 days    Calcium  Date Value Ref Range Status  11/03/2017 9.4 8.7 - 10.2 mg/dL Final         Passed - Na in normal range and within 360 days    Sodium  Date Value Ref Range Status  11/03/2017 139 134 - 144 mmol/L Final         Passed - Cr in normal range and within 360 days    Creat  Date Value Ref Range Status  04/07/2015 1.00 0.60 -  1.35 mg/dL Final   Creatinine, Ser  Date Value Ref Range Status  11/03/2017 1.20 0.76 - 1.27 mg/dL Final         Passed - Valid encounter within last 6 months    Recent Outpatient Visits          2 months ago Essential hypertension   [facility], Laurey Arrow, MD   6 months ago Annual physical exam   [facility], Laurey Arrow, MD   8 months ago Acute non-recurrent maxillary sinusitis   [facility], Laurey Arrow, MD   1 year ago Essential hypertension   [facility], Laurey Arrow, MD   1 year ago Essential hypertension   [facility], Laurey Arrow, MD      Future Appointments            In 1 month Shawnee Knapp, MD Primary Care at [place], [facility]         . potassium chloride SA (K-DUR,KLOR-CON) 20 MEQ tablet [Pharmacy Med Name: POTASSIUM CL 20MEQ ER TABLETS] 360 tablet 0    Sig: TAKE 2 TABLETS(40 MEQ) BY MOUTH TWICE DAILY     Endocrinology:  Minerals - Potassium Supplementation Passed - 01/29/2018  1:39 PM      Passed - K in normal range and within 360 days    Potassium  Date Value Ref Range Status  11/03/2017 3.7 3.5 - 5.2 mmol/L Final         Passed - Cr in normal range and within 360 days    Creat  Date Value Ref Range Status  04/07/2015 1.00 0.60 - 1.35 mg/dL Final   Creatinine, Ser  Date Value Ref Range Status  11/03/2017 1.20 0.76 - 1.27 mg/dL Final         Passed -  Valid encounter within last 12 months    Recent Outpatient Visits          2 months ago Essential hypertension   [facility], Laurey Arrow, MD   6 months ago Annual physical exam   [facility], Laurey Arrow, MD   8 months ago Acute non-recurrent maxillary sinusitis   [facility], Laurey Arrow, MD   1 year ago Essential hypertension   [facility], Laurey Arrow, MD   1 year ago Essential hypertension   [facility], Laurey Arrow, MD      Future Appointments            In 1 month Shawnee Knapp, MD Primary Care at [place], [facility]         . carvedilol (COREG) 6.25 MG tablet [Pharmacy Med Name: CARVEDILOL 6.25MG  TABLETS] 90 tablet 0    Sig: TAKE 1 TABLET(6.25 MG) BY MOUTH DAILY     Cardiovascular:  Beta Blockers Failed - 01/29/2018  1:39 PM      Failed - Last BP in normal range    BP Readings from Last 1 Encounters:  01/11/18 (!) 151/97         Passed - Last Heart Rate in normal range    Pulse Readings from Last 1 Encounters:  01/11/18 80         Passed - Valid encounter within last 6 months    Recent Outpatient Visits          2 months ago Essential hypertension   Primary Care at  [facility], MD   6 months ago Annual physical exam   [facility], Laurey Arrow, MD   8 months ago Acute non-recurrent maxillary sinusitis   [facility], Laurey Arrow, MD   1 year ago Essential hypertension   [facility], Laurey Arrow, MD   1 year ago Essential hypertension   Primary Care at [facility], Laurey Arrow, MD      Future Appointments            In 1 month Shawnee Knapp, MD Primary Care at [place], [facility]         . carvedilol (COREG) 6.25 MG tablet [Pharmacy Med Name: CARVEDILOL 6.25MG  TABLETS] 90 tablet 0    Sig: TAKE 1 TABLET(6.25 MG) BY MOUTH DAILY     Cardiovascular:  Beta Blockers Failed - 01/29/2018  1:39 PM      Failed - Last BP in normal range    BP Readings from Last 1 Encounters:  01/11/18 (!) 151/97          Passed - Last Heart Rate in normal range    Pulse Readings from Last 1 Encounters:  01/11/18 80         Passed - Valid encounter within last 6 months    Recent Outpatient Visits          2 months ago Essential hypertension   [facility], Laurey Arrow, MD   6 months ago Annual physical exam   [facility], Laurey Arrow, MD   8 months ago Acute non-recurrent maxillary sinusitis   [facility], Laurey Arrow, MD   1 year ago Essential hypertension   [facility], Laurey Arrow, MD   1 year ago Essential hypertension   [facility], Laurey Arrow, MD      Future Appointments            In 1 month Shawnee Knapp, MD Primary Care at [place], [facility]         . potassium chloride SA (K-DUR,KLOR-CON) 20 MEQ tablet [Pharmacy Med Name: POTASSIUM CL 20MEQ ER TABLETS] 360 tablet 0    Sig: TAKE 2 TABLETS(40 MEQ) BY MOUTH TWICE DAILY     Endocrinology:  Minerals - Potassium Supplementation Passed - 01/29/2018  1:39 PM      Passed - K in normal range and within 360 days    Potassium  Date Value Ref Range Status  11/03/2017 3.7 3.5 - 5.2 mmol/L Final         Passed - Cr in normal range and within 360 days    Creat  Date Value Ref Range Status  04/07/2015 1.00 0.60 - 1.35 mg/dL Final   Creatinine, Ser  Date Value Ref Range Status  11/03/2017 1.20 0.76 - 1.27 mg/dL Final         Passed - Valid encounter within last 12 months    Recent Outpatient Visits          2 months ago Essential hypertension   [facility], Laurey Arrow, MD   6 months ago Annual physical exam   [facility], Laurey Arrow, MD   8 months ago Acute non-recurrent maxillary sinusitis   [facility], Laurey Arrow, MD   1 year ago Essential hypertension   [facility], Laurey Arrow, MD   1  year ago Essential hypertension   Primary [facility] at [place], MD      Future Appointments            In 1 month Brigitte Pulse [facility], MD Primary  Care at [place], [facility]

## 2018-02-21 ENCOUNTER — Telehealth: Payer: Self-pay

## 2018-02-21 NOTE — Telephone Encounter (Signed)
We have attempted to call the patient two times to schedule sleep study.  Patient has been unavailable at the phone numbers we have on file and has not returned our calls.  At this point we will send a letter asking patient to please contact the sleep lab to schedule their sleep study.  If patient calls back we will schedule them for their sleep study. 

## 2018-02-28 ENCOUNTER — Other Ambulatory Visit: Payer: Self-pay | Admitting: Family Medicine

## 2018-03-01 ENCOUNTER — Other Ambulatory Visit: Payer: Self-pay | Admitting: Family Medicine

## 2018-03-01 NOTE — Telephone Encounter (Signed)
Requested Prescriptions  Pending Prescriptions Disp Refills  . olmesartan-hydrochlorothiazide (BENICAR HCT) 40-25 MG tablet [Pharmacy Med Name: OLMESARTAN MEDOX/HCTZ 40-25MG  TAB] 90 tablet 0    Sig: TAKE 1 TABLET BY MOUTH DAILY     Cardiovascular: ARB + Diuretic Combos Failed - 03/01/2018  7:00 AM      Failed - Last BP in normal range    BP Readings from Last 1 Encounters:  01/11/18 (!) 151/97         Passed - K in normal range and within 180 days    Potassium  Date Value Ref Range Status  11/03/2017 3.7 3.5 - 5.2 mmol/L Final         Passed - Na in normal range and within 180 days    Sodium  Date Value Ref Range Status  11/03/2017 139 134 - 144 mmol/L Final         Passed - Cr in normal range and within 180 days    Creat  Date Value Ref Range Status  04/07/2015 1.00 0.60 - 1.35 mg/dL Final   Creatinine, Ser  Date Value Ref Range Status  11/03/2017 1.20 0.76 - 1.27 mg/dL Final         Passed - Ca in normal range and within 180 days    Calcium  Date Value Ref Range Status  11/03/2017 9.4 8.7 - 10.2 mg/dL Final         Passed - Patient is not pregnant      Passed - Valid encounter within last 6 months    Recent Outpatient Visits          3 months ago Essential hypertension   [facility], Laurey Arrow, MD   7 months ago Annual physical exam   [facility], Laurey Arrow, MD   9 months ago Acute non-recurrent maxillary sinusitis   [facility], Laurey Arrow, MD   1 year ago Essential hypertension   [facility], Laurey Arrow, MD   1 year ago Essential hypertension   [facility], Laurey Arrow, MD      Future Appointments            In 1 week Shawnee Knapp, MD Primary Care at [place], [facility]

## 2018-03-13 ENCOUNTER — Ambulatory Visit: Payer: BLUE CROSS/BLUE SHIELD | Admitting: Family Medicine

## 2018-03-22 ENCOUNTER — Telehealth: Payer: Self-pay | Admitting: Family Medicine

## 2018-03-22 NOTE — Telephone Encounter (Signed)
MyChart message sent to pt about their appointment with Dr Brigitte Pulse on 04/06/18

## 2018-03-30 ENCOUNTER — Other Ambulatory Visit: Payer: Self-pay | Admitting: Family Medicine

## 2018-04-06 ENCOUNTER — Ambulatory Visit: Payer: BLUE CROSS/BLUE SHIELD | Admitting: Family Medicine

## 2018-04-07 ENCOUNTER — Other Ambulatory Visit: Payer: Self-pay | Admitting: Family Medicine

## 2018-04-07 NOTE — Telephone Encounter (Signed)
Patient called to check the status of his BP medication refill.  He stated that he will run out of his medication by the end of the weekend.  Please advise and call patient back at [phone]

## 2018-04-30 ENCOUNTER — Other Ambulatory Visit: Payer: Self-pay | Admitting: Family Medicine

## 2018-05-01 NOTE — Telephone Encounter (Signed)
Patient has appointment 05/04/18 Requested Prescriptions  Pending Prescriptions Disp Refills  . furosemide (LASIX) 40 MG tablet [Pharmacy Med Name: FUROSEMIDE 40MG  TABLETS] 360 tablet 0    Sig: TAKE 2 TABLETS(80 MG) BY MOUTH TWICE DAILY     Cardiovascular:  Diuretics - Loop Failed - 04/30/2018  2:36 PM      Failed - Last BP in normal range    BP Readings from Last 1 Encounters:  01/11/18 (!) 151/97         Passed - K in normal range and within 360 days    Potassium  Date Value Ref Range Status  11/03/2017 3.7 3.5 - 5.2 mmol/L Final         Passed - Ca in normal range and within 360 days    Calcium  Date Value Ref Range Status  11/03/2017 9.4 8.7 - 10.2 mg/dL Final         Passed - Na in normal range and within 360 days    Sodium  Date Value Ref Range Status  11/03/2017 139 134 - 144 mmol/L Final         Passed - Cr in normal range and within 360 days    Creat  Date Value Ref Range Status  04/07/2015 1.00 0.60 - 1.35 mg/dL Final   Creatinine, Ser  Date Value Ref Range Status  11/03/2017 1.20 0.76 - 1.27 mg/dL Final         Passed - Valid encounter within last 6 months    Recent Outpatient Visits          5 months ago Essential hypertension   [facility], Laurey Arrow, MD   9 months ago Annual physical exam   [facility], Laurey Arrow, MD   11 months ago Acute non-recurrent maxillary sinusitis   [facility], Laurey Arrow, MD   1 year ago Essential hypertension   [facility], Laurey Arrow, MD   1 year ago Essential hypertension   [facility], Laurey Arrow, MD      Future Appointments            In 3 days Wendie Agreste, MD Primary Care at [place], [facility]         . carvedilol (COREG) 6.25 MG tablet [Pharmacy Med Name: CARVEDILOL 6.25MG  TABLETS] 90 tablet 0    Sig: TAKE 1 TABLET(6.25 MG) BY MOUTH DAILY     Cardiovascular:  Beta Blockers Failed - 04/30/2018  2:36 PM      Failed - Last BP in normal range    BP  Readings from Last 1 Encounters:  01/11/18 (!) 151/97         Passed - Last Heart Rate in normal range    Pulse Readings from Last 1 Encounters:  01/11/18 80         Passed - Valid encounter within last 6 months    Recent Outpatient Visits          5 months ago Essential hypertension   [facility], Laurey Arrow, MD   9 months ago Annual physical exam   [facility], Laurey Arrow, MD   11 months ago Acute non-recurrent maxillary sinusitis   [facility], Laurey Arrow, MD   1 year ago Essential hypertension   [facility], Laurey Arrow, MD   1 year ago Essential hypertension   Primary  Care at [facility], Laurey Arrow, MD      Future Appointments            In 3 days Wendie Agreste, MD Primary Care at [place], [facility]         . potassium chloride SA (K-DUR,KLOR-CON) 20 MEQ tablet [Pharmacy Med Name: POTASSIUM CL 20MEQ ER TABLETS] 360 tablet 0    Sig: TAKE 2 TABLETS(40 MEQ) BY MOUTH TWICE DAILY     Endocrinology:  Minerals - Potassium Supplementation Passed - 04/30/2018  2:36 PM      Passed - K in normal range and within 360 days    Potassium  Date Value Ref Range Status  11/03/2017 3.7 3.5 - 5.2 mmol/L Final         Passed - Cr in normal range and within 360 days    Creat  Date Value Ref Range Status  04/07/2015 1.00 0.60 - 1.35 mg/dL Final   Creatinine, Ser  Date Value Ref Range Status  11/03/2017 1.20 0.76 - 1.27 mg/dL Final         Passed - Valid encounter within last 12 months    Recent Outpatient Visits          5 months ago Essential hypertension   [facility], Laurey Arrow, MD   9 months ago Annual physical exam   [facility], Laurey Arrow, MD   11 months ago Acute non-recurrent maxillary sinusitis   [facility], Laurey Arrow, MD   1 year ago Essential hypertension   [facility], Laurey Arrow, MD   1 year ago Essential hypertension   [facility] at [facility], Laurey Arrow, MD      Future  Appointments            In 3 days Wendie Agreste, MD Primary Care at [place], [facility]

## 2018-05-04 ENCOUNTER — Ambulatory Visit: Payer: BLUE CROSS/BLUE SHIELD | Admitting: Family Medicine

## 2018-05-04 ENCOUNTER — Encounter: Payer: Self-pay | Admitting: Family Medicine

## 2018-05-04 ENCOUNTER — Other Ambulatory Visit: Payer: Self-pay

## 2018-05-04 VITALS — BP 110/67 | HR 77 | Temp 98.2°F | Resp 16 | Ht 66.0 in | Wt 281.8 lb

## 2018-05-04 DIAGNOSIS — E785 Hyperlipidemia, unspecified: Secondary | ICD-10-CM

## 2018-05-04 DIAGNOSIS — I10 Essential (primary) hypertension: Secondary | ICD-10-CM | POA: Diagnosis not present

## 2018-05-04 DIAGNOSIS — K219 Gastro-esophageal reflux disease without esophagitis: Secondary | ICD-10-CM

## 2018-05-04 DIAGNOSIS — R609 Edema, unspecified: Secondary | ICD-10-CM

## 2018-05-04 DIAGNOSIS — J309 Allergic rhinitis, unspecified: Secondary | ICD-10-CM

## 2018-05-04 DIAGNOSIS — R7303 Prediabetes: Secondary | ICD-10-CM

## 2018-05-04 LAB — POCT GLYCOSYLATED HEMOGLOBIN (HGB A1C): Hemoglobin A1C: 5.8 % — AB (ref 4.0–5.6)

## 2018-05-04 MED ORDER — CARVEDILOL 6.25 MG PO TABS: ORAL_TABLET | ORAL | 0 refills | Status: DC

## 2018-05-04 MED ORDER — FUROSEMIDE 40 MG PO TABS: ORAL_TABLET | ORAL | 0 refills | Status: DC

## 2018-05-04 MED ORDER — OLMESARTAN MEDOXOMIL 40 MG PO TABS: ORAL_TABLET | ORAL | 0 refills | Status: DC

## 2018-05-04 MED ORDER — HYDROCHLOROTHIAZIDE 25 MG PO TABS: ORAL_TABLET | ORAL | 0 refills | Status: DC

## 2018-05-04 MED ORDER — CETIRIZINE HCL 10 MG PO TABS: 10.0000 mg | ORAL_TABLET | Freq: Every day | ORAL | 11 refills | Status: DC

## 2018-05-04 MED ORDER — POTASSIUM CHLORIDE CRYS ER 20 MEQ PO TBCR: EXTENDED_RELEASE_TABLET | ORAL | 0 refills | Status: DC

## 2018-05-04 MED ORDER — PANTOPRAZOLE SODIUM 40 MG PO TBEC: DELAYED_RELEASE_TABLET | ORAL | 0 refills | Status: DC

## 2018-05-04 NOTE — Patient Instructions (Addendum)
I would recommend having the sleep study as possible sleep apnea.   I will refer you to cardiology to discuss swelling and possible heart/diastolic dysfunction. I do recommend using compression stockings daily.   I have refilled your medications, please follow-up with primary care provider in the next 3 months if possible.    Good news, the prediabetes test has improved.  Would recommend checking that again in the next 3 to 6 months.  Thank you for coming in today.   If you have lab work done today you will be contacted with your lab results within the next 2 weeks.  If you have not heard from [place] then please contact us. The fastest way to get your results is to register for My Chart.   IF you received an x-ray today, you will receive an invoice from [facility]. Please contact [facility] at [phone] with questions or concerns regarding your invoice.   IF you received labwork today, you will receive an invoice from [facility]. Please contact [facility] at [phone] with questions or concerns regarding your invoice.   Our billing staff will not be able to assist you with questions regarding bills from these companies.  You will be contacted with the lab results as soon as they are available. The fastest way to get your results is to activate your My Chart account. Instructions are located on the last page of this paperwork. If you have not heard from [place] regarding the results in 2 weeks, please contact this office.

## 2018-05-04 NOTE — Progress Notes (Signed)
Subjective:    Patient ID: Philip Conley, male    DOB: September 22, 1970, 48 y.o.   MRN: [id number]  HPI Philip Conley is a 48 y.o. male Presents today for: Chief Complaint  Patient presents with  . Hypertension    last seen 11/03/2017, here to get refill on bp meds. Still have some edems even with the hctz. Bp in the office today was at 110/67.    QVZ:DGLO, Philip Arrow, MD Last office visit with Dr. Brigitte Conley in August 2019. Needs all refills today.  Was on Lasix 40 mg twice daily with potassium 20 mg twice daily, Maxide 37.5/25.  Had discontinued CCB's due to persistent pedal edema.  Carvedilol 6.25 mg twice daily had been decreased to daily due to dehydration potential and diarrhea.  Some dizziness with looking down and back up.  Borderline low blood pressures at 107/75 at his dentist previously.  Pedal edema has been treated with compression socks previously, but had to stop using the compression socks.  Was having some difficulty with sleeping a few hours per night only at that time.   It appears he was changed to Benicar HCT 40/25 continue on Lasix but 80 mg twice daily, carvedilol 6.25 mg daily, potassium 80 mEq twice daily  Was seen by vascular and vein specialist on January 10, 2018.  Recommended 20 to 30 mm knee-high compression stockings, weight loss, salty food avoidance and leg elevation.  Venous reflux study was negative for superficial venous reflux, and only showed reflux in the deep system bilateral common femoral veins.  Sleep specialist eval October 30 for insomnia evaluation, suspected OSA.  Also recommended ENT evaluation due to nasal congestion.  Suspected diastolic dysfunction.  Entrained insomnia from third shift work, recommended sleep habits eliminating caffeine try and avoid diuretics at nighttime and possible trazodone a sleep aid.  ENT eval November 15.  Right anterior septum was cauterized for recurring epistaxis.  Plan for sleep study for snoring/breathing and sleeping  problems.  Phone note on December 10, [facility] had tried to call the patient 2 times to schedule a sleep study but unavailable.  Letter was sent for him to call and schedule that study.    - patient has not scheduled sleep study - work schedule has been tough.   - sleeping 4-6 hrs, waking up during the night.   - still taking lasix in evening as well.  Has not seen cardiology for edema or echo  - using compression stockings 3-4 days per week, mostly on weekends.   Hypertension: BP Readings from Last 3 Encounters:  05/04/18 110/67  01/11/18 (!) 151/97  01/10/18 (!) 146/92   Lab Results  Component Value Date   CREATININE 1.20 11/03/2017   See above.  Home BP 121/79 at dentist.  Had to split combo - on benicar 40mg  QD, hctz 25mg  qd. Coreg 6.2mg  qd. No new side effects - not lightheaded/dizzy - only if standing up too fast.  Taking 80mg  lasix BID (2nd dose by 5pm)., K dur 54meq BID.  GERD: protonix for GERD. Daily.  No hx of ulcers.    Patient Active Problem List   Diagnosis Date Noted  . Circadian rhythm sleep disorder, shift work type 01/11/2018  . Subacute ethmoidal sinusitis 01/11/2018  . OSA (obstructive sleep apnea) 01/11/2018  . Class 3 drug-induced obesity with serious comorbidity and body mass index (BMI) of 45.0 to 49.9 in adult ([place]) 01/11/2018  . Edema of both ankles 01/11/2018  . Pedal edema 11/02/2017  .  Venous (peripheral) insufficiency 11/02/2017  . Prediabetes 11/02/2017  . Carpal tunnel syndrome of right wrist 03/28/2017  . Hyperlipidemia with target low density lipoprotein (LDL) cholesterol less than 130 mg/dL 03/31/2012  . Migraine headache 03/31/2012  . Medication monitoring encounter 03/31/2012  . Insomnia 02/25/2012  . Hypertension 02/25/2012   Past Medical History:  Diagnosis Date  . Allergy    Past Surgical History:  Procedure Laterality Date  . HAND SURGERY     Growth removal    No Known Allergies Prior to Admission  medications   Medication Sig Start Date End Date Taking? Authorizing Provider  Aspirin-Acetaminophen-Caffeine (EXCEDRIN MIGRAINE PO) Take by mouth as needed. Reported on 07/05/2015   Yes [provider]  carvedilol (COREG) 6.25 MG tablet TAKE 1 TABLET(6.25 MG) BY MOUTH DAILY 05/01/18  Yes Shawnee Knapp, MD  cetirizine (ZYRTEC) 10 MG tablet Take 1 tablet (10 mg total) by mouth at bedtime. 11/03/17  Yes Shawnee Knapp, MD  furosemide (LASIX) 40 MG tablet TAKE 2 TABLETS(80 MG) BY MOUTH TWICE DAILY 05/01/18  Yes Shawnee Knapp, MD  hydrochlorothiazide (HYDRODIURIL) 25 MG tablet TAKE 1 TABLET BY MOUTH EVERY DAY ALONG WITH THE OLMESARTAN(REPLACING THE COMBO TABLET DUE TO BACKORDER) 04/07/18  Yes Shawnee Knapp, MD  olmesartan (BENICAR) 40 MG tablet TAKE 1 TABLET BY MOUTH DAILY ALONG WITH THE HYDROCHLOROTHIAZIDE(REPLACING THE COMBO TABLET DUE TO BACKORDER) 04/07/18  Yes Shawnee Knapp, MD  pantoprazole (PROTONIX) 40 MG tablet TAKE 1 TABLET(40 MG) BY MOUTH DAILY 03/01/18  Yes Shawnee Knapp, MD  potassium chloride SA (K-DUR,KLOR-CON) 20 MEQ tablet TAKE 2 TABLETS(40 MEQ) BY MOUTH TWICE DAILY 05/01/18  Yes Shawnee Knapp, MD   Social History   Socioeconomic History  . Marital status: Married    Spouse name: Not on file  . Number of children: 3  . Years of education: Not on file  . Highest education level: Not on file  Occupational History  . Not on file  Social Needs  . Financial resource strain: Not on file  . Food insecurity:    Worry: Not on file    Inability: Not on file  . Transportation needs:    Medical: Not on file    Non-medical: Not on file  Tobacco Use  . Smoking status: Never Smoker  . Smokeless tobacco: Never Used  Substance and Sexual Activity  . Alcohol use: Not Currently    Frequency: Never    Comment: occ  . Drug use: Never  . Sexual activity: Yes    Birth control/protection: None    Comment: number of sex partners in the last 12 months  1  Lifestyle  . Physical activity:    Days per  week: Not on file    Minutes per session: Not on file  . Stress: Not on file  Relationships  . Social connections:    Talks on phone: Not on file    Gets together: Not on file    Attends religious service: Not on file    Active member of club or organization: Not on file    Attends meetings of clubs or organizations: Not on file    Relationship status: Not on file  . Intimate partner violence:    Fear of current or ex partner: Not on file    Emotionally abused: Not on file    Physically abused: Not on file    Forced sexual activity: Not on file  Other Topics Concern  . Not on file  Social History Narrative  . Not on file     Review of Systems  Constitutional: Negative for fatigue and unexpected weight change.  Eyes: Negative for visual disturbance.  Respiratory: Negative for cough, chest tightness and shortness of breath.   Cardiovascular: Negative for chest pain, palpitations and leg swelling.  Gastrointestinal: Negative for abdominal pain and blood in stool.  Neurological: Negative for dizziness, light-headedness and headaches.       Objective:   Physical Exam Vitals signs reviewed.  Constitutional:      Appearance: He is well-developed.  HENT:     Head: Normocephalic and atraumatic.  Eyes:     Pupils: Pupils are equal, round, and reactive to light.  Neck:     Vascular: No carotid bruit or JVD.  Cardiovascular:     Rate and Rhythm: Normal rate and regular rhythm.     Heart sounds: Normal heart sounds. No murmur.  Pulmonary:     Effort: Pulmonary effort is normal.     Breath sounds: Normal breath sounds. No rales.  Skin:    General: Skin is warm and dry.  Neurological:     Mental Status: He is alert and oriented to person, place, and time.    Vitals:   05/04/18 1442  BP: 110/67  Conley: 77  Resp: 16  Temp: 98.2 F (36.8 C)  TempSrc: Oral  SpO2: 97%  Weight: 281 lb 12.8 oz (127.8 kg)  Height: 5\' 6"  (1.676 m)   1+ pedal edema lower third tibia on the  right, mid tibia on left.    Assessment & Plan:   Philip Conley is a 48 y.o. male Essential hypertension - Plan: Comprehensive metabolic panel, Ambulatory referral to Cardiology, carvedilol (COREG) 6.25 MG tablet, hydrochlorothiazide (HYDRODIURIL) 25 MG tablet, olmesartan (BENICAR) 40 MG tablet  -  Stable, tolerating current regimen. Medications refilled. Labs pending as above.   -Still recommended to follow-up with sleep specialist for possible OSA.  Hyperlipidemia with target low density lipoprotein (LDL) cholesterol less than 130 mg/dL - Plan: Lipid Panel  -Check lipids, determine statin need/recommendations with results  Prediabetes - Plan: POCT glycosylated hemoglobin (Hb A1C)  -Improved, continue to work on diet/activity, recheck levels 3 to 6 months  Peripheral edema - Plan: Ambulatory referral to Cardiology, furosemide (LASIX) 40 MG tablet, potassium chloride SA (K-DUR,KLOR-CON) 20 MEQ tablet  -Referred to cardiology to evaluate for possible echo, continue Lasix, potassium supplementation, check labs, continue compressive stockings.  Gastroesophageal reflux disease, esophagitis presence not specified - Plan: pantoprazole (PROTONIX) 40 MG tablet  -Stable, continue Protonix 40 mg daily  Allergic rhinitis, unspecified seasonality, unspecified trigger - Plan: cetirizine (ZYRTEC) 10 MG tablet  -Stable, continue Zyrtec.  Meds ordered this encounter  Medications  . carvedilol (COREG) 6.25 MG tablet    Sig: TAKE 1 TABLET(6.25 MG) BY MOUTH DAILY    Dispense:  90 tablet    Refill:  0  . cetirizine (ZYRTEC) 10 MG tablet    Sig: Take 1 tablet (10 mg total) by mouth at bedtime.    Dispense:  30 tablet    Refill:  11  . furosemide (LASIX) 40 MG tablet    Sig: TAKE 2 TABLETS(80 MG) BY MOUTH TWICE DAILY    Dispense:  360 tablet    Refill:  0  . hydrochlorothiazide (HYDRODIURIL) 25 MG tablet    Sig: TAKE 1 TABLET BY MOUTH EVERY DAY ALONG WITH THE OLMESARTAN(REPLACING THE COMBO TABLET  DUE TO BACKORDER)    Dispense:  30 tablet  Refill:  0  . olmesartan (BENICAR) 40 MG tablet    Sig: TAKE 1 TABLET BY MOUTH DAILY ALONG WITH THE HYDROCHLOROTHIAZIDE(REPLACING THE COMBO TABLET DUE TO BACKORDER)    Dispense:  30 tablet    Refill:  0  . pantoprazole (PROTONIX) 40 MG tablet    Sig: TAKE 1 TABLET(40 MG) BY MOUTH DAILY    Dispense:  90 tablet    Refill:  0  . potassium chloride SA (K-DUR,KLOR-CON) 20 MEQ tablet    Sig: TAKE 2 TABLETS(40 MEQ) BY MOUTH TWICE DAILY    Dispense:  360 tablet    Refill:  0   Patient Instructions   I would recommend having the sleep study as possible sleep apnea.   I will refer you to cardiology to discuss swelling and possible heart/diastolic dysfunction. I do recommend using compression stockings daily.   I have refilled your medications, please follow-up with primary care provider in the next 3 months if possible.    Good news, the prediabetes test has improved.  Would recommend checking that again in the next 3 to 6 months.  Thank you for coming in today.   If you have lab work done today you will be contacted with your lab results within the next 2 weeks.  If you have not heard from [place] then please contact us. The fastest way to get your results is to register for My Chart.   IF you received an x-ray today, you will receive an invoice from [facility]. Please contact [facility] at [phone] with questions or concerns regarding your invoice.   IF you received labwork today, you will receive an invoice from [facility]. Please contact [facility] at [phone] with questions or concerns regarding your invoice.   Our billing staff will not be able to assist you with questions regarding bills from these companies.  You will be contacted with the lab results as soon as they are available. The fastest way to get your results is to activate your My Chart account. Instructions are located on the last page of this paperwork. If  you have not heard from [place] regarding the results in 2 weeks, please contact this office.       Signed,   Merri Ray, MD [facility].  05/06/18 8:27 AM

## 2018-05-05 LAB — LIPID PANEL
Chol/HDL Ratio: 6.3 ratio — ABNORMAL HIGH (ref 0.0–5.0)
Cholesterol, Total: 183 mg/dL (ref 100–199)
HDL: 29 mg/dL — ABNORMAL LOW (ref >39)
LDL Calculated: 111 mg/dL — ABNORMAL HIGH (ref 0–99)
Triglycerides: 215 mg/dL — ABNORMAL HIGH (ref 0–149)
VLDL Cholesterol Cal: 43 mg/dL — ABNORMAL HIGH (ref 5–40)

## 2018-05-05 LAB — COMPREHENSIVE METABOLIC PANEL
ALT: 44 IU/L (ref 0–44)
AST: 34 IU/L (ref 0–40)
Albumin/Globulin Ratio: 1.9 (ref 1.2–2.2)
Albumin: 4.4 g/dL (ref 4.0–5.0)
Alkaline Phosphatase: 78 IU/L (ref 39–117)
BUN/Creatinine Ratio: 17 (ref 9–20)
BUN: 18 mg/dL (ref 6–24)
Bilirubin Total: 0.3 mg/dL (ref 0.0–1.2)
CO2: 25 mmol/L (ref 20–29)
Calcium: 9.3 mg/dL (ref 8.7–10.2)
Chloride: 97 mmol/L (ref 96–106)
Creatinine, Ser: 1.08 mg/dL (ref 0.76–1.27)
GFR calc Af Amer: 93 mL/min/{1.73_m2} (ref >59)
GFR calc non Af Amer: 80 mL/min/{1.73_m2} (ref >59)
Globulin, Total: 2.3 g/dL (ref 1.5–4.5)
Glucose: 94 mg/dL (ref 65–99)
Potassium: 3.6 mmol/L (ref 3.5–5.2)
Sodium: 142 mmol/L (ref 134–144)
Total Protein: 6.7 g/dL (ref 6.0–8.5)

## 2018-05-28 ENCOUNTER — Other Ambulatory Visit: Payer: Self-pay | Admitting: Family Medicine

## 2018-05-28 DIAGNOSIS — I10 Essential (primary) hypertension: Secondary | ICD-10-CM

## 2018-05-31 ENCOUNTER — Other Ambulatory Visit: Payer: Self-pay | Admitting: Family Medicine

## 2018-05-31 DIAGNOSIS — I10 Essential (primary) hypertension: Secondary | ICD-10-CM

## 2018-07-01 ENCOUNTER — Other Ambulatory Visit: Payer: Self-pay | Admitting: Family Medicine

## 2018-07-01 DIAGNOSIS — I10 Essential (primary) hypertension: Secondary | ICD-10-CM

## 2018-07-17 ENCOUNTER — Other Ambulatory Visit: Payer: Self-pay | Admitting: Family Medicine

## 2018-07-17 DIAGNOSIS — K219 Gastro-esophageal reflux disease without esophagitis: Secondary | ICD-10-CM

## 2018-08-04 ENCOUNTER — Ambulatory Visit: Payer: BLUE CROSS/BLUE SHIELD | Admitting: Family Medicine

## 2018-08-04 ENCOUNTER — Telehealth: Payer: BLUE CROSS/BLUE SHIELD | Admitting: Family Medicine

## 2018-10-05 DIAGNOSIS — R05 Cough: Secondary | ICD-10-CM | POA: Diagnosis not present

## 2018-10-05 DIAGNOSIS — Z20828 Contact with and (suspected) exposure to other viral communicable diseases: Secondary | ICD-10-CM | POA: Diagnosis not present

## 2018-10-05 DIAGNOSIS — J3489 Other specified disorders of nose and nasal sinuses: Secondary | ICD-10-CM | POA: Diagnosis not present

## 2018-10-11 ENCOUNTER — Other Ambulatory Visit: Payer: Self-pay | Admitting: Family Medicine

## 2018-10-11 DIAGNOSIS — R609 Edema, unspecified: Secondary | ICD-10-CM

## 2018-10-11 DIAGNOSIS — I10 Essential (primary) hypertension: Secondary | ICD-10-CM

## 2018-11-09 ENCOUNTER — Other Ambulatory Visit: Payer: Self-pay | Admitting: Family Medicine

## 2018-11-09 DIAGNOSIS — I10 Essential (primary) hypertension: Secondary | ICD-10-CM

## 2018-11-09 NOTE — Telephone Encounter (Signed)
Requested medication (s) are due for refill today: yes  Requested medication (s) are on the active medication list: yes  Last refill:  05/31/2018  Future visit scheduled: no  Notes to clinic:  Review for refill Spoke with patient and advised him to call office for appintment   Requested Prescriptions  Pending Prescriptions Disp Refills   olmesartan (BENICAR) 40 MG tablet [Pharmacy Med Name: OLMESARTAN MEDOXOMIL 40MG  TABLETS] 30 tablet 1    Sig: TAKE 1 TABLET BY MOUTH DAILY ALONG WITH HYDROCHLOROTHIAZIDE     Cardiovascular:  Angiotensin Receptor Blockers Failed - 11/09/2018  1:05 PM      Failed - Cr in normal range and within 180 days    Creat  Date Value Ref Range Status  04/07/2015 1.00 0.60 - 1.35 mg/dL Final   Creatinine, Ser  Date Value Ref Range Status  05/04/2018 1.08 0.76 - 1.27 mg/dL Final         Failed - K in normal range and within 180 days    Potassium  Date Value Ref Range Status  05/04/2018 3.6 3.5 - 5.2 mmol/L Final         Failed - Valid encounter within last 6 months    Recent Outpatient Visits          6 months ago Essential hypertension   [facility], Ranell Patrick, MD   1 year ago Essential hypertension   [facility], Laurey Arrow, MD   1 year ago Annual physical exam   [facility], Laurey Arrow, MD   1 year ago Acute non-recurrent maxillary sinusitis   [facility], Laurey Arrow, MD   1 year ago Essential hypertension   [facility], Laurey Arrow, MD             Passed - Patient is not pregnant      Passed - Last BP in normal range    BP Readings from Last 1 Encounters:  05/04/18 110/67

## 2018-11-10 ENCOUNTER — Other Ambulatory Visit: Payer: Self-pay | Admitting: Family Medicine

## 2018-11-10 DIAGNOSIS — I10 Essential (primary) hypertension: Secondary | ICD-10-CM

## 2018-12-08 ENCOUNTER — Ambulatory Visit: Payer: BC Managed Care – PPO | Admitting: Family Medicine

## 2018-12-08 ENCOUNTER — Encounter: Payer: Self-pay | Admitting: Family Medicine

## 2018-12-08 ENCOUNTER — Other Ambulatory Visit: Payer: Self-pay

## 2018-12-08 VITALS — BP 115/75 | HR 81 | Temp 98.7°F | Ht 66.0 in | Wt 276.0 lb

## 2018-12-08 DIAGNOSIS — R609 Edema, unspecified: Secondary | ICD-10-CM | POA: Diagnosis not present

## 2018-12-08 DIAGNOSIS — E785 Hyperlipidemia, unspecified: Secondary | ICD-10-CM | POA: Diagnosis not present

## 2018-12-08 DIAGNOSIS — K219 Gastro-esophageal reflux disease without esophagitis: Secondary | ICD-10-CM | POA: Diagnosis not present

## 2018-12-08 DIAGNOSIS — Z1211 Encounter for screening for malignant neoplasm of colon: Secondary | ICD-10-CM

## 2018-12-08 DIAGNOSIS — R7303 Prediabetes: Secondary | ICD-10-CM | POA: Diagnosis not present

## 2018-12-08 DIAGNOSIS — R195 Other fecal abnormalities: Secondary | ICD-10-CM

## 2018-12-08 DIAGNOSIS — I10 Essential (primary) hypertension: Secondary | ICD-10-CM | POA: Diagnosis not present

## 2018-12-08 MED ORDER — PANTOPRAZOLE SODIUM 20 MG PO TBEC: 20.0000 mg | DELAYED_RELEASE_TABLET | Freq: Every day | ORAL | 1 refills | Status: DC

## 2018-12-08 MED ORDER — FUROSEMIDE 40 MG PO TABS: ORAL_TABLET | ORAL | 1 refills | Status: DC

## 2018-12-08 MED ORDER — OLMESARTAN MEDOXOMIL 40 MG PO TABS: 40.0000 mg | ORAL_TABLET | Freq: Every day | ORAL | 1 refills | Status: DC

## 2018-12-08 MED ORDER — CARVEDILOL 6.25 MG PO TABS: 6.2500 mg | ORAL_TABLET | Freq: Two times a day (BID) | ORAL | 1 refills | Status: DC

## 2018-12-08 MED ORDER — POTASSIUM CHLORIDE CRYS ER 20 MEQ PO TBCR: 40.0000 meq | EXTENDED_RELEASE_TABLET | Freq: Two times a day (BID) | ORAL | 0 refills | Status: DC

## 2018-12-08 NOTE — Progress Notes (Signed)
9/25/20209:17 AM  Philip Conley 07-04-1970, 48 y.o., male [id number]  Chief Complaint  Patient presents with  . Hypertension  . Medication Refill    all meds, fyi gets flu shot at wk    HPI:   Patient is a 48 y.o. male with past medical history significant for HTN, migraines, HLP, prediabetes, GERD, cervical radiculopathy who presents today for transfer of care  Previous PCP Dr Rushie Goltz all meds as prescribed Has been working on weight loss Working on his diet Has noticed improved peripheral edema Takes lasix 80mg  AM and 40mg  PM, takes KCL with it, he also takes HCTZ Monitors salt intake Sees urology twice a year - genital warts, checks PSA GERD well controlled on PPI -  Has never had HH, ulcers, GIB, has never tried decreasing dose Flu vaccine at work  Lab Results  Component Value Date   HGBA1C 5.8 (A) 05/04/2018   Lab Results  Component Value Date   CHOL 183 05/04/2018   HDL 29 (L) 05/04/2018   LDLCALC 111 (H) 05/04/2018   TRIG 215 (H) 05/04/2018   CHOLHDL 6.3 (H) 05/04/2018   Lab Results  Component Value Date   CREATININE 1.08 05/04/2018   BUN 18 05/04/2018   NA 142 05/04/2018   K 3.6 05/04/2018   CL 97 05/04/2018   CO2 25 05/04/2018   Wt Readings from Last 3 Encounters:  12/08/18 276 lb (125.2 kg)  05/04/18 281 lb 12.8 oz (127.8 kg)  01/11/18 292 lb (132.5 kg)   BP Readings from Last 3 Encounters:  12/08/18 115/75  05/04/18 110/67  01/11/18 (!) 151/97    Depression screen PHQ 2/9 12/08/2018 05/04/2018 11/03/2017  Decreased Interest 0 0 0  Down, Depressed, Hopeless 0 0 0  PHQ - 2 Score 0 0 0    Fall Risk  12/08/2018 05/04/2018 11/03/2017 08/02/2017 05/18/2017  Falls in the past year? 0 0 No No No  Number falls in past yr: 0 0 - - -  Injury with Fall? 0 0 - - -  Follow up - Falls evaluation completed - - -     No Known Allergies  Prior to Admission medications   Medication Sig Start Date End Date Taking? Authorizing Provider   Aspirin-Acetaminophen-Caffeine (EXCEDRIN MIGRAINE PO) Take by mouth as needed. Reported on 07/05/2015   Yes [provider]  carvedilol (COREG) 6.25 MG tablet TAKE 1 TABLET(6.25 MG) BY MOUTH DAILY 10/11/18  Yes Wendie Agreste, MD  cetirizine (ZYRTEC) 10 MG tablet Take 1 tablet (10 mg total) by mouth at bedtime. 05/04/18  Yes Wendie Agreste, MD  furosemide (LASIX) 40 MG tablet TAKE 2 TABLETS(80 MG) BY MOUTH TWICE DAILY 10/11/18  Yes Wendie Agreste, MD  hydrochlorothiazide (HYDRODIURIL) 25 MG tablet TAKE 1 TABLET BY MOUTH EVERY DAY ALONG WITH OLMESARTAN 07/01/18  Yes Wendie Agreste, MD  olmesartan (BENICAR) 40 MG tablet TAKE 1 TABLET BY MOUTH DAILY ALONG WITH HYDROCHLOROTHIAZIDE 11/10/18  Yes Wendie Agreste, MD  pantoprazole (PROTONIX) 40 MG tablet TAKE 1 TABLET(40 MG) BY MOUTH DAILY 07/17/18  Yes Wendie Agreste, MD  potassium chloride SA (K-DUR) 20 MEQ tablet TAKE 2 TABLETS(40 MEQ) BY MOUTH TWICE DAILY 10/11/18  Yes Wendie Agreste, MD    Past Medical History:  Diagnosis Date  . Allergy     Past Surgical History:  Procedure Laterality Date  . HAND SURGERY     Growth removal     Social History   Tobacco Use  .  Smoking status: Never Smoker  . Smokeless tobacco: Never Used  Substance Use Topics  . Alcohol use: Not Currently    Frequency: Never    Comment: occ    Family History  Problem Relation Age of Onset  . Heart disease Mother        mumur  . Hypertension Mother   . Lupus Father   . Stroke Father   . Heart disease Maternal Grandmother   . Lung disease Maternal Grandfather   . Heart disease Paternal Grandmother   . Stroke Paternal Grandfather     Review of Systems  Constitutional: Negative for chills and fever.  Respiratory: Negative for cough and shortness of breath.   Cardiovascular: Negative for chest pain, palpitations and leg swelling.  Gastrointestinal: Negative for abdominal pain, blood in stool, melena, nausea and vomiting.  Genitourinary:  Negative for dysuria and hematuria.  Musculoskeletal: Positive for neck pain.  Neurological: Positive for tingling.  Endo/Heme/Allergies: Positive for environmental allergies.   Per hpi  OBJECTIVE:  Today's Vitals   12/08/18 0906  BP: 115/75  Pulse: 81  Temp: 98.7 F (37.1 C)  SpO2: 97%  Weight: 276 lb (125.2 kg)  Height: 5\' 6"  (1.676 m)   Body mass index is 44.55 kg/m.   Physical Exam Vitals signs and nursing note reviewed.  Constitutional:      Appearance: He is well-developed.  HENT:     Head: Normocephalic and atraumatic.  Eyes:     Conjunctiva/sclera: Conjunctivae normal.     Pupils: Pupils are equal, round, and reactive to light.  Neck:     Musculoskeletal: Neck supple.  Cardiovascular:     Rate and Rhythm: Normal rate and regular rhythm.     Heart sounds: No murmur. No friction rub. No gallop.   Pulmonary:     Effort: Pulmonary effort is normal.     Breath sounds: Normal breath sounds. No wheezing or rales.  Musculoskeletal:     Right lower leg: Edema (pitting +1, distal shin) present.     Left lower leg: Edema present.  Skin:    General: Skin is warm and dry.  Neurological:     Mental Status: He is alert and oriented to person, place, and time.     No results found for this or any previous visit (from the past 24 hour(s)).  No results found.   ASSESSMENT and PLAN  1. Essential hypertension Controlled. Trial off hctz given high dose lasix. Recheck at next OV. - CBC - TSH - carvedilol (COREG) 6.25 MG tablet; Take 1 tablet (6.25 mg total) by mouth 2 (two) times daily with a meal. - olmesartan (BENICAR) 40 MG tablet; Take 1 tablet (40 mg total) by mouth daily.  2. Gastroesophageal reflux disease, esophagitis presence not specified Trial of lower dose, cont with dietary modifications. - pantoprazole (PROTONIX) 20 MG tablet; Take 1 tablet (20 mg total) by mouth daily before supper.  3. Peripheral edema Stable. Labs pending for monitoring. Cont  current mgt. - furosemide (LASIX) 40 MG tablet; Take 2 tablets (80 mg total) by mouth every morning AND 1 tablet (40 mg total) daily before supper. - potassium chloride SA (K-DUR) 20 MEQ tablet; Take 2 tablets (40 mEq total) by mouth 2 (two) times daily.  4. Hyperlipidemia with target low density lipoprotein (LDL) cholesterol less than 130 mg/dL Checking labs today, medications will be adjusted as needed.  - Lipid panel - Comprehensive metabolic panel  5. Prediabetes Labs pending, cont with LFM and weight loss -  Hemoglobin A1c  6. Special screening for malignant neoplasms, colon - Cologuard  Return in about 4 weeks (around 01/05/2019) for GERD, edema, HTN.    Rutherford Guys, MD [facility] [place],  [address] Ph.  [phone] Fax [phone]

## 2018-12-08 NOTE — Patient Instructions (Signed)
° ° ° °  If you have lab work done today you will be contacted with your lab results within the next 2 weeks.  If you have not heard from us then please contact us. The fastest way to get your results is to register for My Chart. ° ° °IF you received an x-ray today, you will receive an invoice from [facility]. Please contact  Radiology at [phone] with questions or concerns regarding your invoice.  ° °IF you received labwork today, you will receive an invoice from [facility]. Please contact [facility] at [phone] with questions or concerns regarding your invoice.  ° °Our billing staff will not be able to assist you with questions regarding bills from these companies. ° °You will be contacted with the lab results as soon as they are available. The fastest way to get your results is to activate your My Chart account. Instructions are located on the last page of this paperwork. If you have not heard from us regarding the results in 2 weeks, please contact this office. °  ° ° ° °

## 2018-12-09 LAB — COMPREHENSIVE METABOLIC PANEL
ALT: 62 IU/L — ABNORMAL HIGH (ref 0–44)
AST: 42 IU/L — ABNORMAL HIGH (ref 0–40)
Albumin/Globulin Ratio: 1.9 (ref 1.2–2.2)
Albumin: 4.6 g/dL (ref 4.0–5.0)
Alkaline Phosphatase: 89 IU/L (ref 39–117)
BUN/Creatinine Ratio: 11 (ref 9–20)
BUN: 14 mg/dL (ref 6–24)
Bilirubin Total: 0.5 mg/dL (ref 0.0–1.2)
CO2: 27 mmol/L (ref 20–29)
Calcium: 9.8 mg/dL (ref 8.7–10.2)
Chloride: 97 mmol/L (ref 96–106)
Creatinine, Ser: 1.31 mg/dL — ABNORMAL HIGH (ref 0.76–1.27)
GFR calc Af Amer: 73 mL/min/{1.73_m2} (ref >59)
GFR calc non Af Amer: 63 mL/min/{1.73_m2} (ref >59)
Globulin, Total: 2.4 g/dL (ref 1.5–4.5)
Glucose: 113 mg/dL — ABNORMAL HIGH (ref 65–99)
Potassium: 3.6 mmol/L (ref 3.5–5.2)
Sodium: 141 mmol/L (ref 134–144)
Total Protein: 7 g/dL (ref 6.0–8.5)

## 2018-12-09 LAB — CBC
Hematocrit: 44.9 % (ref 37.5–51.0)
Hemoglobin: 15.3 g/dL (ref 13.0–17.7)
MCH: 30.5 pg (ref 26.6–33.0)
MCHC: 34.1 g/dL (ref 31.5–35.7)
MCV: 89 fL (ref 79–97)
Platelets: 261 10*3/uL (ref 150–450)
RBC: 5.02 x10E6/uL (ref 4.14–5.80)
RDW: 12.8 % (ref 11.6–15.4)
WBC: 6 10*3/uL (ref 3.4–10.8)

## 2018-12-09 LAB — LIPID PANEL
Chol/HDL Ratio: 6.4 ratio — ABNORMAL HIGH (ref 0.0–5.0)
Cholesterol, Total: 204 mg/dL — ABNORMAL HIGH (ref 100–199)
HDL: 32 mg/dL — ABNORMAL LOW (ref >39)
LDL Chol Calc (NIH): 139 mg/dL — ABNORMAL HIGH (ref 0–99)
Triglycerides: 183 mg/dL — ABNORMAL HIGH (ref 0–149)
VLDL Cholesterol Cal: 33 mg/dL (ref 5–40)

## 2018-12-09 LAB — HEMOGLOBIN A1C
Est. average glucose Bld gHb Est-mCnc: 117 mg/dL
Hgb A1c MFr Bld: 5.7 % — ABNORMAL HIGH (ref 4.8–5.6)

## 2018-12-09 LAB — TSH: TSH: 1.21 u[IU]/mL (ref 0.450–4.500)

## 2018-12-27 DIAGNOSIS — Z1211 Encounter for screening for malignant neoplasm of colon: Secondary | ICD-10-CM | POA: Diagnosis not present

## 2019-01-04 LAB — COLOGUARD: Cologuard: POSITIVE — AB

## 2019-01-05 ENCOUNTER — Other Ambulatory Visit: Payer: Self-pay

## 2019-01-05 ENCOUNTER — Encounter: Payer: Self-pay | Admitting: Family Medicine

## 2019-01-05 ENCOUNTER — Ambulatory Visit: Payer: BC Managed Care – PPO | Admitting: Family Medicine

## 2019-01-05 VITALS — BP 128/82 | HR 71 | Temp 97.6°F | Ht 66.0 in | Wt 282.9 lb

## 2019-01-05 DIAGNOSIS — K219 Gastro-esophageal reflux disease without esophagitis: Secondary | ICD-10-CM

## 2019-01-05 DIAGNOSIS — R195 Other fecal abnormalities: Secondary | ICD-10-CM

## 2019-01-05 DIAGNOSIS — I10 Essential (primary) hypertension: Secondary | ICD-10-CM

## 2019-01-05 LAB — COMPREHENSIVE METABOLIC PANEL
ALT: 46 IU/L — ABNORMAL HIGH (ref 0–44)
AST: 32 IU/L (ref 0–40)
Albumin/Globulin Ratio: 2 (ref 1.2–2.2)
Albumin: 4.3 g/dL (ref 4.0–5.0)
Alkaline Phosphatase: 79 IU/L (ref 39–117)
BUN/Creatinine Ratio: 12 (ref 9–20)
BUN: 15 mg/dL (ref 6–24)
Bilirubin Total: 0.3 mg/dL (ref 0.0–1.2)
CO2: 27 mmol/L (ref 20–29)
Calcium: 9.8 mg/dL (ref 8.7–10.2)
Chloride: 100 mmol/L (ref 96–106)
Creatinine, Ser: 1.26 mg/dL (ref 0.76–1.27)
GFR calc Af Amer: 76 mL/min/{1.73_m2} (ref >59)
GFR calc non Af Amer: 66 mL/min/{1.73_m2} (ref >59)
Globulin, Total: 2.2 g/dL (ref 1.5–4.5)
Glucose: 112 mg/dL — ABNORMAL HIGH (ref 65–99)
Potassium: 3.7 mmol/L (ref 3.5–5.2)
Sodium: 140 mmol/L (ref 134–144)
Total Protein: 6.5 g/dL (ref 6.0–8.5)

## 2019-01-05 MED ORDER — FAMOTIDINE 20 MG PO TABS: 20.0000 mg | ORAL_TABLET | Freq: Every day | ORAL | 1 refills | Status: DC

## 2019-01-05 NOTE — Progress Notes (Signed)
10/23/202010:53 AM  Philip Conley 1970-11-07, 48 y.o., male [id number]  Chief Complaint  Patient presents with  . chronic conditions    1 month f.u   . Allergies    starting back up    HPI:   Patient is a 48 y.o. male with past medical history significant for HTN, migraines, HLP, prediabetes, GERD, cervical radiculopathy who presents today for routine followup  Last OV stopped HCTZ and decreased PPI BP and edema doing well with stopping HCTZ GERD still controlled on lower PPI    Depression screen [facility] 2/9 01/05/2019 12/08/2018 05/04/2018  Decreased Interest 0 0 0  Down, Depressed, Hopeless 0 0 0  PHQ - 2 Score 0 0 0    Fall Risk  01/05/2019 12/08/2018 05/04/2018 11/03/2017 08/02/2017  Falls in the past year? 0 0 0 No No  Number falls in past yr: 0 0 0 - -  Injury with Fall? 0 0 0 - -  Follow up Falls evaluation completed - Falls evaluation completed - -     No Known Allergies  Prior to Admission medications   Medication Sig Start Date End Date Taking? Authorizing Provider  Aspirin-Acetaminophen-Caffeine (EXCEDRIN MIGRAINE PO) Take by mouth as needed. Reported on 07/05/2015   Yes [provider]  carvedilol (COREG) 6.25 MG tablet Take 1 tablet (6.25 mg total) by mouth 2 (two) times daily with a meal. 12/08/18  Yes Rutherford Guys, MD  cetirizine (ZYRTEC) 10 MG tablet Take 1 tablet (10 mg total) by mouth at bedtime. 05/04/18  Yes Wendie Agreste, MD  furosemide (LASIX) 40 MG tablet Take 2 tablets (80 mg total) by mouth every morning AND 1 tablet (40 mg total) daily before supper. 12/08/18  Yes Rutherford Guys, MD  olmesartan (BENICAR) 40 MG tablet Take 1 tablet (40 mg total) by mouth daily. 12/08/18  Yes Rutherford Guys, MD  pantoprazole (PROTONIX) 20 MG tablet Take 1 tablet (20 mg total) by mouth daily before supper. 12/08/18  Yes Rutherford Guys, MD  potassium chloride SA (K-DUR) 20 MEQ tablet Take 2 tablets (40 mEq total) by mouth 2 (two) times daily. 12/08/18  Yes  Rutherford Guys, MD    Past Medical History:  Diagnosis Date  . Allergy     Past Surgical History:  Procedure Laterality Date  . HAND SURGERY     Growth removal     Social History   Tobacco Use  . Smoking status: Never Smoker  . Smokeless tobacco: Never Used  Substance Use Topics  . Alcohol use: Not Currently    Frequency: Never    Comment: occ    Family History  Problem Relation Age of Onset  . Heart disease Mother        mumur  . Hypertension Mother   . Lupus Father   . Stroke Father   . Heart disease Maternal Grandmother   . Lung disease Maternal Grandfather   . Heart disease Paternal Grandmother   . Stroke Paternal Grandfather     ROS Per hpi  OBJECTIVE:  Today's Vitals   01/05/19 1048  BP: 128/82  Pulse: 71  Temp: 97.6 F (36.4 C)  TempSrc: Oral  SpO2: 96%  Weight: 282 lb 14.4 oz (128.3 kg)  Height: 5\' 6"  (1.676 m)   Body mass index is 45.66 kg/m.   Physical Exam Vitals signs and nursing note reviewed.  Constitutional:      Appearance: He is well-developed.  HENT:     Head: Normocephalic  and atraumatic.  Eyes:     Conjunctiva/sclera: Conjunctivae normal.     Pupils: Pupils are equal, round, and reactive to light.  Neck:     Musculoskeletal: Neck supple.  Pulmonary:     Effort: Pulmonary effort is normal.  Skin:    General: Skin is warm and dry.  Neurological:     Mental Status: He is alert and oriented to person, place, and time.     No results found for this or any previous visit (from the past 24 hour(s)).  No results found.   ASSESSMENT and PLAN  1. Essential hypertension Controlled. Continue current regime.  - Comprehensive metabolic panel  2. Gastroesophageal reflux disease without esophagitis Doing well. Trial off PPI onto H2B  3. Positive colorectal cancer screening using Cologuard test Informed patient of positive results and referral has been made  Other orders - famotidine (PEPCID) 20 MG tablet; Take 1  tablet (20 mg total) by mouth daily.  Return in about 6 months (around 07/06/2019).    Rutherford Guys, MD [facility] [place], [place] [address] Ph.  [phone] Fax [phone]

## 2019-01-05 NOTE — Patient Instructions (Signed)
° ° ° °  If you have lab work done today you will be contacted with your lab results within the next 2 weeks.  If you have not heard from us then please contact us. The fastest way to get your results is to register for My Chart. ° ° °IF you received an x-ray today, you will receive an invoice from [facility]. Please contact [facility] at [phone] with questions or concerns regarding your invoice.  ° °IF you received labwork today, you will receive an invoice from [facility]. Please contact [facility] at [phone] with questions or concerns regarding your invoice.  ° °Our billing staff will not be able to assist you with questions regarding bills from these companies. ° °You will be contacted with the lab results as soon as they are available. The fastest way to get your results is to activate your My Chart account. Instructions are located on the last page of this paperwork. If you have not heard from us regarding the results in 2 weeks, please contact this office. °  ° ° ° °

## 2019-01-05 NOTE — Addendum Note (Signed)
Addended by: Rutherford Guys on: 01/05/2019 08:10 AM   Modules accepted: Orders

## 2019-01-11 ENCOUNTER — Telehealth: Payer: Self-pay | Admitting: Family Medicine

## 2019-01-11 ENCOUNTER — Encounter: Payer: Self-pay | Admitting: Physician Assistant

## 2019-01-11 NOTE — Telephone Encounter (Signed)
[facility] called to make sure the office received the patient's abnormal results via fax on 10/22. If not, the call back no. Is [phone] 6AM-7PM Central time. The ext to select is "Provider support"

## 2019-01-15 NOTE — Telephone Encounter (Signed)
Patient is calling about abnormal lab results. Cologuard was possitive

## 2019-01-16 NOTE — Telephone Encounter (Signed)
Please see notes, labs. ordered by Dr. Pamella Pert and she made a note on lab result.

## 2019-01-16 NOTE — Telephone Encounter (Signed)
Called pt and LVM to call office back about labs.

## 2019-01-25 ENCOUNTER — Encounter: Payer: Self-pay | Admitting: Physician Assistant

## 2019-01-25 ENCOUNTER — Other Ambulatory Visit: Payer: Self-pay

## 2019-01-25 ENCOUNTER — Ambulatory Visit (INDEPENDENT_AMBULATORY_CARE_PROVIDER_SITE_OTHER): Payer: BC Managed Care – PPO | Admitting: Physician Assistant

## 2019-01-25 VITALS — BP 160/90 | HR 86 | Temp 98.2°F | Ht 66.0 in | Wt 285.4 lb

## 2019-01-25 DIAGNOSIS — Z1159 Encounter for screening for other viral diseases: Secondary | ICD-10-CM

## 2019-01-25 DIAGNOSIS — R195 Other fecal abnormalities: Secondary | ICD-10-CM

## 2019-01-25 NOTE — Patient Instructions (Signed)
You have been scheduled for a colonoscopy. Please follow written instructions given to you at your visit today.  Please pick up your prep supplies at the pharmacy within the next 1-3 days. If you use inhalers (even only as needed), please bring them with you on the day of your procedure. Your physician has requested that you go to www.startemmi.com and enter the access code given to you at your visit today. This web site gives a general overview about your procedure. However, you should still follow specific instructions given to you by our office regarding your preparation for the procedure.  It was a pleasure to see you today!  Amy Esterwood PA

## 2019-01-25 NOTE — Progress Notes (Signed)
Subjective:    Patient ID: Philip Conley, male    DOB: 1971/02/12, 48 y.o.   MRN: [id number]  HPI Libero is a pleasant 48 year old white male, new to GI today referred by Dr. Glenis Smoker care with positive Cologuard. Patient has not had any prior GI evaluation.  He did recent labs as part of his annual physical which were unremarkable including hemoglobin of 15.3.  Patient is asymptomatic, denies any abdominal discomfort, changes in bowel habits melena or hematochezia.  He is on high-dose Lasix for chronic lower extremity edema and has some acid reflux related to potassium supplementation.  He is on Pepcid for this with good control. Family history is negative for colon cancer and polyps as far as he is aware. Other medical problems include hypertension, hyperlipidemia, obesity, and chronic lower extremity venous stasis   Review of Systems Pertinent positive and negative review of systems were noted in the above HPI section.  All other review of systems was otherwise negative.  Outpatient Encounter Medications as of 01/25/2019  Medication Sig  . Aspirin-Acetaminophen-Caffeine (EXCEDRIN MIGRAINE PO) Take by mouth as needed. Reported on 07/05/2015  . carvedilol (COREG) 6.25 MG tablet Take 1 tablet (6.25 mg total) by mouth 2 (two) times daily with a meal.  . cetirizine (ZYRTEC) 10 MG tablet Take 1 tablet (10 mg total) by mouth at bedtime.  . famotidine (PEPCID) 20 MG tablet Take 1 tablet (20 mg total) by mouth daily.  . furosemide (LASIX) 40 MG tablet Take 2 tablets (80 mg total) by mouth every morning AND 1 tablet (40 mg total) daily before supper.  . olmesartan (BENICAR) 40 MG tablet Take 1 tablet (40 mg total) by mouth daily.  . potassium chloride SA (K-DUR) 20 MEQ tablet Take 2 tablets (40 mEq total) by mouth 2 (two) times daily.   No facility-administered encounter medications on file as of 01/25/2019.    No Known Allergies Patient Active Problem List   Diagnosis Date Noted  .  Chronic eczematous otitis externa of both ears 01/27/2018  . Recurrent epistaxis 01/27/2018  . Snoring 01/27/2018  . Circadian rhythm sleep disorder, shift work type 01/11/2018  . Subacute ethmoidal sinusitis 01/11/2018  . OSA (obstructive sleep apnea) 01/11/2018  . Class 3 drug-induced obesity with serious comorbidity and body mass index (BMI) of 45.0 to 49.9 in adult ([place]) 01/11/2018  . Edema of both ankles 01/11/2018  . Pedal edema 11/02/2017  . Venous (peripheral) insufficiency 11/02/2017  . Prediabetes 11/02/2017  . Carpal tunnel syndrome of right wrist 03/28/2017  . Hyperlipidemia with target low density lipoprotein (LDL) cholesterol less than 130 mg/dL 03/31/2012  . Migraine headache 03/31/2012  . Medication monitoring encounter 03/31/2012  . Insomnia 02/25/2012  . Hypertension 02/25/2012   Social History   Socioeconomic History  . Marital status: Married    Spouse name: Not on file  . Number of children: 3  . Years of education: Not on file  . Highest education level: Not on file  Occupational History  . Not on file  Social Needs  . Financial resource strain: Not on file  . Food insecurity    Worry: Not on file    Inability: Not on file  . Transportation needs    Medical: Not on file    Non-medical: Not on file  Tobacco Use  . Smoking status: Never Smoker  . Smokeless tobacco: Never Used  Substance and Sexual Activity  . Alcohol use: Not Currently    Frequency: Never    Comment:  occ  . Drug use: Never  . Sexual activity: Yes    Birth control/protection: None    Comment: number of sex partners in the last 12 months  1  Lifestyle  . Physical activity    Days per week: Not on file    Minutes per session: Not on file  . Stress: Not on file  Relationships  . Social Herbalist on phone: Not on file    Gets together: Not on file    Attends religious service: Not on file    Active member of club or organization: Not on file    Attends meetings of  clubs or organizations: Not on file    Relationship status: Not on file  . Intimate partner violence    Fear of current or ex partner: Not on file    Emotionally abused: Not on file    Physically abused: Not on file    Forced sexual activity: Not on file  Other Topics Concern  . Not on file  Social History Narrative  . Not on file    Mr. Brideau family history includes Heart disease in his maternal grandmother, mother, and paternal grandmother; Hypertension in his mother; Lung disease in his maternal grandfather; Lupus in his father; Stroke in his father and paternal grandfather.      Objective:    Vitals:   01/25/19 1417  BP: (!) 160/90  Pulse: 86  Temp: 98.2 F (36.8 C)  SpO2: 98%    Physical Exam Well-developed well-nourished white male in no acute Pleasant.  Height, Weight, 285 BMI 46.0  HEENT; nontraumatic normocephalic, EOMI, PER R LA, sclera anicteric. Oropharynx; not examined/mask/Covid Neck; supple, no JVD Cardiovascular; regular rate and rhythm with S1-S2, no murmur rub or gallop Pulmonary; Clear bilaterally Abdomen; soft, obese, nontender, nondistended, no palpable mass or hepatosplenomegaly, bowel sounds are active Rectal; not done today Skin; benign exam, no jaundice rash or appreciable lesions Extremities; no clubbing cyanosis or edema skin warm and dry Neuro/Psych; alert and oriented x4, grossly nonfocal mood and affect appropriate       Assessment & Plan:   #50 48 year old white male with positive Cologuard, asymptomatic  #2 obesity-BMI 46 #3.  Hypertension #.  Hyperlipidemia #5.  Chronic lower extremity edema  Plan; Patient will be scheduled for colonoscopy with Dr. Carlean Purl.  Procedure was discussed in detail with the patient including indications risks and benefits and he is agreeable to proceed.   Shyheim Tanney Genia Harold PA-C 01/25/2019   Cc: Wendie Agreste, MD

## 2019-02-02 ENCOUNTER — Encounter: Payer: Self-pay | Admitting: Internal Medicine

## 2019-02-07 ENCOUNTER — Other Ambulatory Visit: Payer: Self-pay | Admitting: Internal Medicine

## 2019-02-07 DIAGNOSIS — Z1159 Encounter for screening for other viral diseases: Secondary | ICD-10-CM | POA: Diagnosis not present

## 2019-02-09 LAB — SARS CORONAVIRUS 2 (TAT 6-24 HRS): SARS Coronavirus 2: NEGATIVE

## 2019-02-12 ENCOUNTER — Other Ambulatory Visit: Payer: Self-pay

## 2019-02-12 ENCOUNTER — Ambulatory Visit (AMBULATORY_SURGERY_CENTER): Payer: BC Managed Care – PPO | Admitting: Internal Medicine

## 2019-02-12 ENCOUNTER — Encounter: Payer: Self-pay | Admitting: Internal Medicine

## 2019-02-12 VITALS — BP 127/84 | HR 56 | Temp 97.8°F | Resp 14 | Ht 66.0 in | Wt 285.0 lb

## 2019-02-12 DIAGNOSIS — K5730 Diverticulosis of large intestine without perforation or abscess without bleeding: Secondary | ICD-10-CM | POA: Diagnosis not present

## 2019-02-12 DIAGNOSIS — D125 Benign neoplasm of sigmoid colon: Secondary | ICD-10-CM

## 2019-02-12 DIAGNOSIS — D122 Benign neoplasm of ascending colon: Secondary | ICD-10-CM

## 2019-02-12 DIAGNOSIS — R195 Other fecal abnormalities: Secondary | ICD-10-CM

## 2019-02-12 MED ORDER — SODIUM CHLORIDE 0.9 % IV SOLN: 500.0000 mL | Freq: Once | INTRAVENOUS | Status: DC

## 2019-02-12 NOTE — Progress Notes (Signed)
Called to room to assist during endoscopic procedure.  Patient ID and intended procedure confirmed with present staff. Received instructions for my participation in the procedure from the performing physician.  

## 2019-02-12 NOTE — Patient Instructions (Addendum)
I found and removed 4 small polyps. These were likely cause of the + Cologuard so it did the job. They look benign but pre-cancerous - have been removed.  You also have a condition called diverticulosis - common and not usually a problem. Please read the handout provided.  I will let you know pathology results and when to have another routine colonoscopy by mail and/or My Chart.  I appreciate the opportunity to care for you. Gatha Mayer, MD, FACG YOU HAD AN ENDOSCOPIC PROCEDURE TODAY AT [facility]:   Refer to the procedure report that was given to you for any specific questions about what was found during the examination.  If the procedure report does not answer your questions, please call your gastroenterologist to clarify.  If you requested that your care partner not be given the details of your procedure findings, then the procedure report has been included in a sealed envelope for you to review at your convenience later.  YOU SHOULD EXPECT: Some feelings of bloating in the abdomen. Passage of more gas than usual.  Walking can help get rid of the air that was put into your GI tract during the procedure and reduce the bloating. If you had a lower endoscopy (such as a colonoscopy or flexible sigmoidoscopy) you may notice spotting of blood in your stool or on the toilet paper. If you underwent a bowel prep for your procedure, you may not have a normal bowel movement for a few days.  Please Note:  You might notice some irritation and congestion in your nose or some drainage.  This is from the oxygen used during your procedure.  There is no need for concern and it should clear up in a day or so.  SYMPTOMS TO REPORT IMMEDIATELY:   Following lower endoscopy (colonoscopy or flexible sigmoidoscopy):  Excessive amounts of blood in the stool  Significant tenderness or worsening of abdominal pains  Swelling of the abdomen that is new, acute  Fever of 100F or higher   For urgent  or emergent issues, a gastroenterologist can be reached at any hour by calling [phone].   DIET:  We do recommend a small meal at first, but then you may proceed to your regular diet.  Drink plenty of fluids but you should avoid alcoholic beverages for 24 hours.  MEDICATIONS: Continue present medications.  Please see handouts given to you by your recovery nurse.  ACTIVITY:  You should plan to take it easy for the rest of today and you should NOT DRIVE or use heavy machinery until tomorrow (because of the sedation medicines used during the test).    FOLLOW UP: Our staff will call the number listed on your records 48-72 hours following your procedure to check on you and address any questions or concerns that you may have regarding the information given to you following your procedure. If we do not reach you, we will leave a message.  We will attempt to reach you two times.  During this call, we will ask if you have developed any symptoms of COVID 19. If you develop any symptoms (ie: fever, flu-like symptoms, shortness of breath, cough etc.) before then, please call [phone].  If you test positive for Covid 19 in the 2 weeks post procedure, please call and report this information to [place].    If any biopsies were taken you will be contacted by phone or by letter within the next 1-3 weeks.  Please call us at [phone]  [phone] if you have not heard about the biopsies in 3 weeks.   Thank you for allowing [place] to provide for your healthcare needs today.   SIGNATURES/CONFIDENTIALITY: You and/or your care partner have signed paperwork which will be entered into your electronic medical record.  These signatures attest to the fact that that the information above on your After Visit Summary has been reviewed and is understood.  Full responsibility of the confidentiality of this discharge information lies with you and/or your care-partner.

## 2019-02-12 NOTE — Progress Notes (Signed)
A/ox3, pleased with MAC, report to RN 

## 2019-02-12 NOTE — Progress Notes (Signed)
VS-CW Temp JB

## 2019-02-12 NOTE — Op Note (Signed)
[place] Patient Name: Philip Conley Procedure Date: 02/12/2019 2:12 PM MRN: [id number] Endoscopist: Gatha Mayer , MD Age: 48 Referring MD:  Date of Birth: 11-22-1970 Gender: Male Account #: [id number] Procedure:                Colonoscopy Indications:              Positive Cologuard test Medicines:                Propofol per Anesthesia, Monitored Anesthesia Care Procedure:                Pre-Anesthesia Assessment:                           - Prior to the procedure, a History and Physical                            was performed, and patient medications and                            allergies were reviewed. The patient's tolerance of                            previous anesthesia was also reviewed. The risks                            and benefits of the procedure and the sedation                            options and risks were discussed with the patient.                            All questions were answered, and informed consent                            was obtained. Prior Anticoagulants: The patient has                            taken no previous anticoagulant or antiplatelet                            agents. ASA Grade Assessment: III - A patient with                            severe systemic disease. After reviewing the risks                            and benefits, the patient was deemed in                            satisfactory condition to undergo the procedure.                           After obtaining informed consent, the colonoscope  was passed under direct vision. Throughout the                            procedure, the patient's blood pressure, pulse, and                            oxygen saturations were monitored continuously. The                            Colonoscope was introduced through the anus and                            advanced to the the cecum, identified by                            appendiceal orifice  and ileocecal valve. The                            colonoscopy was performed without difficulty. The                            patient tolerated the procedure well. The quality                            of the bowel preparation was good. The ileocecal                            valve, appendiceal orifice, and rectum were                            photographed. The bowel preparation used was                            Miralax via split dose instruction. Scope In: 2:19:55 PM Scope Out: 2:32:02 PM Scope Withdrawal Time: 0 hours 10 minutes 36 seconds  Total Procedure Duration: 0 hours 12 minutes 7 seconds  Findings:                 The perianal and digital rectal examinations were                            normal. Pertinent negatives include normal prostate                            (size, shape, and consistency).                           Four sessile polyps were found in the sigmoid colon                            and ascending colon. The polyps were diminutive in                            size. These polyps were removed with a cold snare.  Resection and retrieval were complete. Verification                            of patient identification for the specimen was                            done. Estimated blood loss was minimal.                           Multiple diverticula were found in the sigmoid                            colon, descending colon and transverse colon.                           The exam was otherwise without abnormality on                            direct and retroflexion views. Complications:            No immediate complications. Estimated Blood Loss:     Estimated blood loss was minimal. Impression:               - Four diminutive polyps in the sigmoid colon and                            in the ascending colon, removed with a cold snare.                            Resected and retrieved.                           - Diverticulosis  in the sigmoid colon, in the                            descending colon and in the transverse colon.                           - The examination was otherwise normal on direct                            and retroflexion views. Recommendation:           - Patient has a contact number available for                            emergencies. The signs and symptoms of potential                            delayed complications were discussed with the                            patient. Return to normal activities tomorrow.                            Written  discharge instructions were provided to the                            patient.                           - Resume previous diet.                           - Continue present medications.                           - Repeat colonoscopy is recommended. The                            colonoscopy date will be determined after pathology                            results from today's exam become available for                            review. Gatha Mayer, MD 02/12/2019 2:43:24 PM This report has been signed electronically.

## 2019-02-14 ENCOUNTER — Telehealth: Payer: Self-pay | Admitting: *Deleted

## 2019-02-14 NOTE — Telephone Encounter (Signed)
  Follow up Call-  Call back number 02/12/2019  Post procedure Call Back phone  # [phone]  Permission to leave phone message Yes  Some recent data might be hidden     Patient questions:  Do you have a fever, pain , or abdominal swelling? No. Pain Score  0 *  Have you tolerated food without any problems? Yes.    Have you been able to return to your normal activities? Yes.    Do you have any questions about your discharge instructions: Diet   No. Medications  No. Follow up visit  No.  Do you have questions or concerns about your Care? No.  Actions: * If pain score is 4 or above: 1. No action needed, pain <4.Have you developed a fever since your procedure? no  2.   Have you had an respiratory symptoms (SOB or cough) since your procedure? no  3.   Have you tested positive for COVID 19 since your procedure no  4.   Have you had any family members/close contacts diagnosed with the COVID 19 since your procedure?  no   If yes to any of these questions please route to Joylene John, RN and Alphonsa Gin, Therapist, sports.

## 2019-02-18 ENCOUNTER — Encounter: Payer: Self-pay | Admitting: Internal Medicine

## 2019-02-18 DIAGNOSIS — Z860100 Personal history of colon polyps, unspecified: Secondary | ICD-10-CM

## 2019-02-18 HISTORY — DX: Personal history of colonic polyps: Z86.010

## 2019-02-18 HISTORY — DX: Personal history of colon polyps, unspecified: Z86.0100

## 2019-02-18 NOTE — Progress Notes (Signed)
1 adenoma + 3 ssp (left) all diminutive Recall  2023 My Chart Letter

## 2019-03-14 ENCOUNTER — Ambulatory Visit: Payer: BC Managed Care – PPO | Attending: Internal Medicine

## 2019-03-14 ENCOUNTER — Other Ambulatory Visit: Payer: Self-pay

## 2019-03-14 DIAGNOSIS — Z20822 Contact with and (suspected) exposure to covid-19: Secondary | ICD-10-CM

## 2019-03-14 DIAGNOSIS — Z20828 Contact with and (suspected) exposure to other viral communicable diseases: Secondary | ICD-10-CM | POA: Diagnosis not present

## 2019-03-15 LAB — NOVEL CORONAVIRUS, NAA: SARS-CoV-2, NAA: DETECTED — AB

## 2019-03-16 ENCOUNTER — Encounter: Payer: Self-pay | Admitting: Infectious Diseases

## 2019-03-16 DIAGNOSIS — Z6841 Body Mass Index (BMI) 40.0 and over, adult: Secondary | ICD-10-CM | POA: Insufficient documentation

## 2019-04-25 ENCOUNTER — Other Ambulatory Visit: Payer: Self-pay | Admitting: Family Medicine

## 2019-04-25 DIAGNOSIS — R609 Edema, unspecified: Secondary | ICD-10-CM

## 2019-05-03 ENCOUNTER — Other Ambulatory Visit: Payer: Self-pay | Admitting: Family Medicine

## 2019-05-03 DIAGNOSIS — R609 Edema, unspecified: Secondary | ICD-10-CM

## 2019-05-03 DIAGNOSIS — J309 Allergic rhinitis, unspecified: Secondary | ICD-10-CM

## 2019-05-03 DIAGNOSIS — I10 Essential (primary) hypertension: Secondary | ICD-10-CM

## 2019-05-03 DIAGNOSIS — K219 Gastro-esophageal reflux disease without esophagitis: Secondary | ICD-10-CM

## 2019-05-03 NOTE — Telephone Encounter (Signed)
Requested medication (s) are due for refill today:   Yes for Xyrtec       No for hydrochlorothiazide.  It was discontinued on 12/08/2018.  Requested medication (s) are on the active medication list:   Yes for Zyrtec  Future visit scheduled:   Yes in 2 months with Dr. Carlota Raspberry   Last ordered: Zyrtec 05/04/2018  #30  11 refills                    Requested Prescriptions  Pending Prescriptions Disp Refills   hydrochlorothiazide (HYDRODIURIL) 25 MG tablet [Pharmacy Med Name: HYDROCHLOROTHIAZIDE 25MG  TABLETS] 90 tablet 0    Sig: TAKE 1 TABLET BY MOUTH EVERY DAY WITH OLMESARTAN      Cardiovascular: Diuretics - Thiazide Passed - 05/03/2019  9:35 AM      Passed - Ca in normal range and within 360 days    Calcium  Date Value Ref Range Status  01/05/2019 9.8 8.7 - 10.2 mg/dL Final          Passed - Cr in normal range and within 360 days    Creat  Date Value Ref Range Status  04/07/2015 1.00 0.60 - 1.35 mg/dL Final   Creatinine, Ser  Date Value Ref Range Status  01/05/2019 1.26 0.76 - 1.27 mg/dL Final          Passed - K in normal range and within 360 days    Potassium  Date Value Ref Range Status  01/05/2019 3.7 3.5 - 5.2 mmol/L Final          Passed - Na in normal range and within 360 days    Sodium  Date Value Ref Range Status  01/05/2019 140 134 - 144 mmol/L Final          Passed - Last BP in normal range    BP Readings from Last 1 Encounters:  02/12/19 127/84          Passed - Valid encounter within last 6 months    Recent Outpatient Visits           3 months ago Essential hypertension   [facility], Lilia Argue, MD   4 months ago Essential hypertension   [facility], Lilia Argue, MD   12 months ago Essential hypertension   [facility], Ranell Patrick, MD   1 year ago Essential hypertension   [facility], Laurey Arrow, MD   1 year ago Annual physical exam   [facility], Laurey Arrow, MD        Future Appointments             In 2 months Wendie Agreste, MD [facility] at [place], [place]              cetirizine (ZYRTEC) 10 MG tablet [Pharmacy Med Name: CETIRIZINE 10MG  TABLETS] 90 tablet 0    Sig: TAKE 1 TABLET(10 MG) BY MOUTH AT BEDTIME      Ear, Nose, and Throat:  Antihistamines Passed - 05/03/2019  9:35 AM      Passed - Valid encounter within last 12 months    Recent Outpatient Visits           3 months ago Essential hypertension   [facility], Lilia Argue, MD   4 months ago Essential hypertension   [facility], Lilia Argue, MD   12 months ago Essential  hypertension   [facility], Ranell Patrick, MD   1 year ago Essential hypertension   [facility], Laurey Arrow, MD   1 year ago Annual physical exam   [facility], Laurey Arrow, MD       Future Appointments             In 2 months Carlota Raspberry Ranell Patrick, MD Primary [facility] at [place], [facility]

## 2019-05-03 NOTE — Telephone Encounter (Signed)
Requested medication (s) are due for refill today:   potassium is due.      Protonix has been discontinued.  Requested medication (s) are on the active medication list:   Yes for potassium.     Not the protonix.  Future visit scheduled:   Yes in 2 months with Dr. Carlota Raspberry   Last ordered: Potassium 04/25/2019  #180  0 refills.       Protonix discontinued 01/05/2019.        Requested Prescriptions  Pending Prescriptions Disp Refills   potassium chloride SA (KLOR-CON) 20 MEQ tablet [Pharmacy Med Name: POTASSIUM CL 20MEQ ER TABLETS] 180 tablet 0    Sig: TAKE 2 TABLETS(40 MEQ) BY MOUTH TWICE DAILY      Endocrinology:  Minerals - Potassium Supplementation Passed - 05/03/2019  8:46 AM      Passed - K in normal range and within 360 days    Potassium  Date Value Ref Range Status  01/05/2019 3.7 3.5 - 5.2 mmol/L Final          Passed - Cr in normal range and within 360 days    Creat  Date Value Ref Range Status  04/07/2015 1.00 0.60 - 1.35 mg/dL Final   Creatinine, Ser  Date Value Ref Range Status  01/05/2019 1.26 0.76 - 1.27 mg/dL Final          Passed - Valid encounter within last 12 months    Recent Outpatient Visits           3 months ago Essential hypertension   [facility], Lilia Argue, MD   4 months ago Essential hypertension   [facility], Lilia Argue, MD   12 months ago Essential hypertension   [facility], Ranell Patrick, MD   1 year ago Essential hypertension   [facility], Laurey Arrow, MD   1 year ago Annual physical exam   [facility], Laurey Arrow, MD       Future Appointments             In 2 months Wendie Agreste, MD Primary [facility] at [place], [place]              pantoprazole ([place]) 20 MG tablet [Pharmacy Med Name: PANTOPRAZOLE 20MG  TABLETS] 90 tablet 1    Sig: TAKE 1 TABLET(20 MG) BY MOUTH DAILY BEFORE SUPPER      Gastroenterology: Proton Pump Inhibitors Passed - 05/03/2019  8:46 AM     Passed - Valid encounter within last 12 months    Recent Outpatient Visits           3 months ago Essential hypertension   [facility], Lilia Argue, MD   4 months ago Essential hypertension   [facility], Lilia Argue, MD   12 months ago Essential hypertension   [facility], Ranell Patrick, MD   1 year ago Essential hypertension   [facility], Laurey Arrow, MD   1 year ago Annual physical exam   [facility], Laurey Arrow, MD       Future Appointments             In 2 months Carlota Raspberry Ranell Patrick, MD Primary Care at [place], [facility]

## 2019-05-31 ENCOUNTER — Other Ambulatory Visit: Payer: Self-pay | Admitting: Family Medicine

## 2019-05-31 DIAGNOSIS — I10 Essential (primary) hypertension: Secondary | ICD-10-CM

## 2019-06-16 ENCOUNTER — Other Ambulatory Visit: Payer: Self-pay | Admitting: Family Medicine

## 2019-06-16 DIAGNOSIS — R600 Localized edema: Secondary | ICD-10-CM

## 2019-06-16 DIAGNOSIS — R609 Edema, unspecified: Secondary | ICD-10-CM

## 2019-06-16 NOTE — Telephone Encounter (Signed)
Requested Prescriptions  Pending Prescriptions Disp Refills  . furosemide (LASIX) 40 MG tablet [Pharmacy Med Name: FUROSEMIDE 40MG  TABLETS] 270 tablet 1    Sig: TAKE 2 TABLET BY MOUTH EVERY MORNING AND 1 TABLET BY MOUTH EVERY DAY BEFORE SUPPER     Cardiovascular:  Diuretics - Loop Passed - 06/16/2019  9:08 AM      Passed - K in normal range and within 360 days    Potassium  Date Value Ref Range Status  01/05/2019 3.7 3.5 - 5.2 mmol/L Final         Passed - Ca in normal range and within 360 days    Calcium  Date Value Ref Range Status  01/05/2019 9.8 8.7 - 10.2 mg/dL Final         Passed - Na in normal range and within 360 days    Sodium  Date Value Ref Range Status  01/05/2019 140 134 - 144 mmol/L Final         Passed - Cr in normal range and within 360 days    Creat  Date Value Ref Range Status  04/07/2015 1.00 0.60 - 1.35 mg/dL Final   Creatinine, Ser  Date Value Ref Range Status  01/05/2019 1.26 0.76 - 1.27 mg/dL Final         Passed - Last BP in normal range    BP Readings from Last 1 Encounters:  02/12/19 127/84         Passed - Valid encounter within last 6 months    Recent Outpatient Visits          5 months ago Essential hypertension   [facility], Lilia Argue, MD   6 months ago Essential hypertension   [facility], Lilia Argue, MD   1 year ago Essential hypertension   [facility], Ranell Patrick, MD   1 year ago Essential hypertension   [facility], Laurey Arrow, MD   1 year ago Annual physical exam   [facility], Laurey Arrow, MD      Future Appointments            In 2 weeks Carlota Raspberry Ranell Patrick, MD Primary Care at [place], [facility]

## 2019-07-06 ENCOUNTER — Ambulatory Visit: Payer: BC Managed Care – PPO | Admitting: Family Medicine

## 2019-07-06 ENCOUNTER — Other Ambulatory Visit: Payer: Self-pay

## 2019-07-06 ENCOUNTER — Encounter: Payer: Self-pay | Admitting: Family Medicine

## 2019-07-06 VITALS — BP 146/96 | HR 81 | Temp 98.3°F | Ht 64.0 in | Wt 293.0 lb

## 2019-07-06 DIAGNOSIS — K219 Gastro-esophageal reflux disease without esophagitis: Secondary | ICD-10-CM

## 2019-07-06 DIAGNOSIS — R609 Edema, unspecified: Secondary | ICD-10-CM | POA: Diagnosis not present

## 2019-07-06 DIAGNOSIS — I10 Essential (primary) hypertension: Secondary | ICD-10-CM | POA: Diagnosis not present

## 2019-07-06 DIAGNOSIS — R7303 Prediabetes: Secondary | ICD-10-CM

## 2019-07-06 DIAGNOSIS — E785 Hyperlipidemia, unspecified: Secondary | ICD-10-CM | POA: Diagnosis not present

## 2019-07-06 DIAGNOSIS — Z6841 Body Mass Index (BMI) 40.0 and over, adult: Secondary | ICD-10-CM

## 2019-07-06 LAB — LIPID PANEL

## 2019-07-06 MED ORDER — POTASSIUM CHLORIDE CRYS ER 20 MEQ PO TBCR: EXTENDED_RELEASE_TABLET | ORAL | 1 refills | Status: DC

## 2019-07-06 MED ORDER — OLMESARTAN MEDOXOMIL 40 MG PO TABS: ORAL_TABLET | ORAL | 1 refills | Status: DC

## 2019-07-06 MED ORDER — PANTOPRAZOLE SODIUM 20 MG PO TBEC: DELAYED_RELEASE_TABLET | ORAL | 1 refills | Status: DC

## 2019-07-06 MED ORDER — CARVEDILOL 6.25 MG PO TABS: 6.2500 mg | ORAL_TABLET | Freq: Two times a day (BID) | ORAL | 1 refills | Status: DC

## 2019-07-06 NOTE — Progress Notes (Signed)
Subjective:  Patient ID: Philip Conley, male    DOB: 20-Nov-1970  Age: 49 y.o. MRN: [id number]  CC:  Chief Complaint  Patient presents with  . Hypertension    pt states he hasn't had any issues with his BP today is the highest reading in the past 7 months. pt reports no physical symptoms. pt dose state he gets headaches but atribuits it to allergies.pt reports no issues with current medication.    HPI [name] presents for   Follow-up hypertension, chronic medical conditions.  I last saw him in February 2020.  He has been under the care of Dr. Pamella Pert since that time, listed as his PCP.   Hypertension: With history of peripheral edema.  Lasix 80 mg in the morning, 40 mg in the afternoon prior - now on 40mg  BID for past 6 months, then changed back to 80mg  in am, 40mg  in afternoon since March. Slight decreased swelling on higher dose but still some swelling.  Has compression stockings -every other day - hard to use at work or when more swollen.  Olmesartan 40 mg daily, potassium 65mEq BID (same dose regardless of lasix dosing), carvedilol 6.25 mg twice daily. Stopped HCTZ past 60months- d/c in 11/2018.  Rare cramp. When I saw him in February 2020 he was referred to cardiology to evaluate for possible echo or other evaluation of peripheral edema.  Referral was closed, unable to contact patient.  No prior echo.  Did see vascular in October 2019. BLE chronic venous insufficiency. Knee high compression stockings recommended.  Uses mouthpiece for OSA. Working well.  Home readings: 108/83 at dentist, 128/96 at home. Varies.  Tongue swelling/throat felt like closed with bananas. Has not occurred since stopping bananas.  Rare caffeine HA.  Has not had covid vaccine yet.   Multiple previous notes reviewed including vascular surgery notes, notes by Dr. Pamella Pert.   BP Readings from Last 3 Encounters:  07/06/19 (!) 170/103  02/12/19 127/84  01/25/19 (!) 160/90   Lab Results  Component  Value Date   CREATININE 1.26 01/05/2019    GERD Protonix 20 mg QD. Controlling heartburn.   Hyperlipidemia: Not currently on statin.  Has lost weight to 263,  then gained past month or so, about 30 pounds. Eating less, no diet changes. No known cause.   The 10-year ASCVD risk score Mikey Bussing DC Brooke Bonito., et al., 2013) is: 10.8%   Values used to calculate the score:     Age: 54 years     Sex: Male     Is Non-Hispanic African American: No     Diabetic: No     Tobacco smoker: No     Systolic Blood Pressure: 123XX123 mmHg     Is BP treated: Yes     HDL Cholesterol: 32 mg/dL     Total Cholesterol: 204 mg/dL  Lab Results  Component Value Date   CHOL 204 (H) 12/08/2018   HDL 32 (L) 12/08/2018   LDLCALC 139 (H) 12/08/2018   TRIG 183 (H) 12/08/2018   CHOLHDL 6.4 (H) 12/08/2018   Lab Results  Component Value Date   ALT 46 (H) 01/05/2019   AST 32 01/05/2019   ALKPHOS 79 01/05/2019   BILITOT 0.3 01/05/2019   Obesity/prediabetes: Lost weight to 263, then gained back. More swelling past month. No CP/dyspnea.   Lab Results  Component Value Date   HGBA1C 5.7 (H) 12/08/2018    Wt Readings from Last 3 Encounters:  07/06/19 293 lb (132.9 kg)  02/12/19 285  lb (129.3 kg)  01/25/19 285 lb 6.4 oz (129.5 kg)   Body mass index is 50.29 kg/m.  History Patient Active Problem List   Diagnosis Date Noted  . BMI 45.0-49.9, adult ([place]) 03/16/2019  . Hx of adenomatous and sessile serrated colonic polyps 02/18/2019  . Chronic eczematous otitis externa of both ears 01/27/2018  . Recurrent epistaxis 01/27/2018  . Snoring 01/27/2018  . Circadian rhythm sleep disorder, shift work type 01/11/2018  . Subacute ethmoidal sinusitis 01/11/2018  . OSA (obstructive sleep apnea) 01/11/2018  . Class 3 drug-induced obesity with serious comorbidity and body mass index (BMI) of 45.0 to 49.9 in adult ([place]) 01/11/2018  . Edema of both ankles 01/11/2018  . Pedal edema 11/02/2017  . Venous (peripheral) insufficiency  11/02/2017  . Prediabetes 11/02/2017  . Carpal tunnel syndrome of right wrist 03/28/2017  . Hyperlipidemia with target low density lipoprotein (LDL) cholesterol less than 130 mg/dL 03/31/2012  . Migraine headache 03/31/2012  . Medication monitoring encounter 03/31/2012  . Insomnia 02/25/2012  . Hypertension 02/25/2012   Past Medical History:  Diagnosis Date  . Allergy   . Hx of adenomatous and sessile serrated colonic polyps 02/18/2019  . Hypertension    Past Surgical History:  Procedure Laterality Date  . HAND SURGERY     Growth removal    Allergies  Allergen Reactions  . Banana Other (See Comments)    Constriction in mouth/throat. tongue swelling.    Prior to Admission medications   Medication Sig Start Date End Date Taking? Authorizing Provider  Aspirin-Acetaminophen-Caffeine (EXCEDRIN MIGRAINE PO) Take by mouth as needed. Reported on 07/05/2015   Yes [provider]  carvedilol (COREG) 6.25 MG tablet Take 1 tablet (6.25 mg total) by mouth 2 (two) times daily with a meal. 12/08/18  Yes Rutherford Guys, MD  cetirizine (ZYRTEC) 10 MG tablet TAKE 1 TABLET(10 MG) BY MOUTH AT BEDTIME 05/09/19  Yes Wendie Agreste, MD  famotidine (PEPCID) 20 MG tablet Take 1 tablet (20 mg total) by mouth daily. 01/05/19  Yes Rutherford Guys, MD  furosemide (LASIX) 40 MG tablet TAKE 2 TABLET BY MOUTH EVERY MORNING AND 1 TABLET BY MOUTH EVERY DAY BEFORE SUPPER 06/16/19  Yes Wendie Agreste, MD  hydrochlorothiazide (HYDRODIURIL) 25 MG tablet TAKE 1 TABLET BY MOUTH EVERY DAY WITH OLMESARTAN 05/09/19  Yes Wendie Agreste, MD  olmesartan (BENICAR) 40 MG tablet TAKE 1 TABLET(40 MG) BY MOUTH DAILY 05/31/19  Yes Rutherford Guys, MD  pantoprazole (PROTONIX) 20 MG tablet TAKE 1 TABLET(20 MG) BY MOUTH DAILY BEFORE SUPPER 05/07/19  Yes Wendie Agreste, MD  potassium chloride SA (KLOR-CON) 20 MEQ tablet TAKE 2 TABLETS(40 MEQ) BY MOUTH TWICE DAILY 05/07/19  Yes Wendie Agreste, MD   Social History    Socioeconomic History  . Marital status: Married    Spouse name: Not on file  . Number of children: 3  . Years of education: Not on file  . Highest education level: Not on file  Occupational History  . Not on file  Tobacco Use  . Smoking status: Never Smoker  . Smokeless tobacco: Never Used  Substance and Sexual Activity  . Alcohol use: Not Currently    Comment: occ  . Drug use: Never  . Sexual activity: Yes    Birth control/protection: None    Comment: number of sex partners in the last 41 months  1  Other Topics Concern  . Not on file  Social History Narrative  . Not on  file   Social Determinants of Health   Financial Resource Strain:   . Difficulty of Paying Living Expenses:   Food Insecurity:   . Worried About Charity fundraiser in the Last Year:   . Arboriculturist in the Last Year:   Transportation Needs:   . Film/video editor (Medical):   Marland Kitchen Lack of Transportation (Non-Medical):   Physical Activity:   . Days of Exercise per Week:   . Minutes of Exercise per Session:   Stress:   . Feeling of Stress :   Social Connections:   . Frequency of Communication with Friends and Family:   . Frequency of Social Gatherings with Friends and Family:   . Attends Religious Services:   . Active Member of Clubs or Organizations:   . Attends Archivist Meetings:   Marland Kitchen Marital Status:   Intimate Partner Violence:   . Fear of Current or Ex-Partner:   . Emotionally Abused:   Marland Kitchen Physically Abused:   . Sexually Abused:     Review of Systems  Constitutional: Negative for fatigue and unexpected weight change.  Eyes: Negative for visual disturbance.  Respiratory: Negative for cough, chest tightness and shortness of breath.   Cardiovascular: Positive for leg swelling. Negative for chest pain and palpitations.  Gastrointestinal: Negative for abdominal pain and blood in stool.  Skin: Negative for wound.  Neurological: Negative for dizziness, light-headedness and  headaches.     Objective:   Vitals:   07/06/19 0821  BP: (!) 170/103  Pulse: 81  Temp: 98.3 F (36.8 C)  TempSrc: Temporal  SpO2: 97%  Weight: 293 lb (132.9 kg)  Height: 5\' 4"  (1.626 m)     Physical Exam Vitals reviewed.  Constitutional:      Appearance: He is well-developed.  HENT:     Head: Normocephalic and atraumatic.  Eyes:     Pupils: Pupils are equal, round, and reactive to light.  Neck:     Vascular: No carotid bruit or JVD.  Cardiovascular:     Rate and Rhythm: Normal rate and regular rhythm.     Heart sounds: Normal heart sounds. No murmur.  Pulmonary:     Effort: Pulmonary effort is normal.     Breath sounds: Normal breath sounds. No rales.  Musculoskeletal:     Right lower leg: Edema (2+ pitting left, 1-2+ pitting right and mid tibia.  No wounds.  Some stasis changes.) present.     Left lower leg: Edema present.  Skin:    General: Skin is warm and dry.  Neurological:     Mental Status: He is alert and oriented to person, place, and time.  Psychiatric:        Mood and Affect: Mood normal.        Behavior: Behavior normal.        Assessment & Plan:  Amerion Thong is a 49 y.o. male . Essential hypertension - Plan: Lipid panel, Comprehensive metabolic panel, Ambulatory referral to Cardiology, olmesartan (BENICAR) 40 MG tablet, carvedilol (COREG) 6.25 MG tablet  -Reports lower home readings.  Will increase Lasix dosing temporarily as below.  Will bring copy of home readings to follow-up in approximately 1 week.  No med changes for now, check labs  Prediabetes - Plan: Hemoglobin A1c BMI 40.0-44.9, adult ([facility])  -Previously was able lose some weight, then gained some back.  Plan for diet/exercise approach again.  Peripheral edema - Plan: Ambulatory referral to Cardiology, potassium chloride SA (KLOR-CON) 20 MEQ  tablet  -Increase furosemide dosing for 3 days then return to previous regimen, recheck 1 week.  As swelling improves with higher dose of  furosemide, use compression stockings daily.  -Refer to cardiology again, suspect component of diastolic CHF.  Hyperlipidemia with target low density lipoprotein (LDL) cholesterol less than 130 mg/dL - Plan: Lipid panel  -Check lipids to determine ASCVD risk and statin recommendations.  Gastroesophageal reflux disease - Plan: pantoprazole (PROTONIX) 20 MG tablet  -Stable with Protonix, continue same  Meds ordered this encounter  Medications  . olmesartan (BENICAR) 40 MG tablet    Sig: TAKE 1 TABLET(40 MG) BY MOUTH DAILY    Dispense:  90 tablet    Refill:  1  . pantoprazole (PROTONIX) 20 MG tablet    Sig: TAKE 1 TABLET(20 MG) BY MOUTH DAILY BEFORE SUPPER    Dispense:  90 tablet    Refill:  1  . potassium chloride SA (KLOR-CON) 20 MEQ tablet    Sig: TAKE 2 TABLETS(40 MEQ) BY MOUTH TWICE DAILY    Dispense:  360 tablet    Refill:  1  . carvedilol (COREG) 6.25 MG tablet    Sig: Take 1 tablet (6.25 mg total) by mouth 2 (two) times daily with a meal.    Dispense:  180 tablet    Refill:  1   Patient Instructions   Okay to increase furosemide to 2 pills at bedtime as well as in the morning for the next 3 days, then return to previous dosing.  Take additional potassium in the evening during this higher dosing.  As swelling improves, then can try to wear compression stockings every day.  I will also refer you to cardiology to evaluate for diastolic heart failure or other causes of your swelling.  Bring a copy of your blood pressure readings to the next visit in 1 week.  I did not change any medications today with blood pressure based on readings outside of the office.  I am checking a prediabetes test and other blood work.  We can discuss your previous neck symptoms further at office visit next week including x-rays if needed.  Thank you for coming back to discuss that concern.   If you have lab work done today you will be contacted with your lab results within the next 2 weeks.  If you  have not heard from [place] then please contact us. The fastest way to get your results is to register for My Chart.   IF you received an x-ray today, you will receive an invoice from [facility]. Please contact [facility] at [phone] with questions or concerns regarding your invoice.   IF you received labwork today, you will receive an invoice from [place]. Please contact [facility] at [phone] with questions or concerns regarding your invoice.   Our billing staff will not be able to assist you with questions regarding bills from these companies.  You will be contacted with the lab results as soon as they are available. The fastest way to get your results is to activate your My Chart account. Instructions are located on the last page of this paperwork. If you have not heard from [place] regarding the results in 2 weeks, please contact this office.         Signed, Merri Ray, MD [facility]

## 2019-07-06 NOTE — Patient Instructions (Addendum)
Okay to increase furosemide to 2 pills at bedtime as well as in the morning for the next 3 days, then return to previous dosing.  Take additional potassium in the evening during this higher dosing.  As swelling improves, then can try to wear compression stockings every day.  I will also refer you to cardiology to evaluate for diastolic heart failure or other causes of your swelling.  Bring a copy of your blood pressure readings to the next visit in 1 week.  I did not change any medications today with blood pressure based on readings outside of the office.  I am checking a prediabetes test and other blood work.  We can discuss your previous neck symptoms further at office visit next week including x-rays if needed.  Thank you for coming back to discuss that concern.   If you have lab work done today you will be contacted with your lab results within the next 2 weeks.  If you have not heard from [place] then please contact us. The fastest way to get your results is to register for My Chart.   IF you received an x-ray today, you will receive an invoice from [facility]. Please contact [facility] at [phone] with questions or concerns regarding your invoice.   IF you received labwork today, you will receive an invoice from [place]. Please contact [facility] at [phone] with questions or concerns regarding your invoice.   Our billing staff will not be able to assist you with questions regarding bills from these companies.  You will be contacted with the lab results as soon as they are available. The fastest way to get your results is to activate your My Chart account. Instructions are located on the last page of this paperwork. If you have not heard from [place] regarding the results in 2 weeks, please contact this office.

## 2019-07-07 ENCOUNTER — Encounter: Payer: Self-pay | Admitting: Family Medicine

## 2019-07-07 LAB — HEMOGLOBIN A1C
Est. average glucose Bld gHb Est-mCnc: 120 mg/dL
Hgb A1c MFr Bld: 5.8 % — ABNORMAL HIGH (ref 4.8–5.6)

## 2019-07-07 LAB — COMPREHENSIVE METABOLIC PANEL
ALT: 37 IU/L (ref 0–44)
AST: 25 IU/L (ref 0–40)
Albumin/Globulin Ratio: 1.9 (ref 1.2–2.2)
Albumin: 4.6 g/dL (ref 4.0–5.0)
Alkaline Phosphatase: 103 IU/L (ref 39–117)
BUN/Creatinine Ratio: 12 (ref 9–20)
BUN: 13 mg/dL (ref 6–24)
Bilirubin Total: 0.4 mg/dL (ref 0.0–1.2)
CO2: 24 mmol/L (ref 20–29)
Calcium: 9.6 mg/dL (ref 8.7–10.2)
Chloride: 103 mmol/L (ref 96–106)
Creatinine, Ser: 1.08 mg/dL (ref 0.76–1.27)
GFR calc Af Amer: 92 mL/min/{1.73_m2} (ref >59)
GFR calc non Af Amer: 80 mL/min/{1.73_m2} (ref >59)
Globulin, Total: 2.4 g/dL (ref 1.5–4.5)
Glucose: 97 mg/dL (ref 65–99)
Potassium: 4.1 mmol/L (ref 3.5–5.2)
Sodium: 141 mmol/L (ref 134–144)
Total Protein: 7 g/dL (ref 6.0–8.5)

## 2019-07-07 LAB — LIPID PANEL
Chol/HDL Ratio: 5.1 ratio — ABNORMAL HIGH (ref 0.0–5.0)
Cholesterol, Total: 182 mg/dL (ref 100–199)
HDL: 36 mg/dL — ABNORMAL LOW (ref >39)
LDL Chol Calc (NIH): 123 mg/dL — ABNORMAL HIGH (ref 0–99)
Triglycerides: 126 mg/dL (ref 0–149)
VLDL Cholesterol Cal: 23 mg/dL (ref 5–40)

## 2019-07-16 ENCOUNTER — Ambulatory Visit (INDEPENDENT_AMBULATORY_CARE_PROVIDER_SITE_OTHER): Payer: BC Managed Care – PPO

## 2019-07-16 ENCOUNTER — Other Ambulatory Visit: Payer: Self-pay

## 2019-07-16 ENCOUNTER — Encounter: Payer: Self-pay | Admitting: Family Medicine

## 2019-07-16 ENCOUNTER — Ambulatory Visit: Payer: BC Managed Care – PPO | Admitting: Family Medicine

## 2019-07-16 ENCOUNTER — Telehealth: Payer: Self-pay | Admitting: Family Medicine

## 2019-07-16 VITALS — BP 152/96 | HR 83 | Temp 98.4°F | Ht 64.0 in | Wt 294.0 lb

## 2019-07-16 DIAGNOSIS — M5412 Radiculopathy, cervical region: Secondary | ICD-10-CM

## 2019-07-16 DIAGNOSIS — M542 Cervicalgia: Secondary | ICD-10-CM

## 2019-07-16 DIAGNOSIS — R609 Edema, unspecified: Secondary | ICD-10-CM

## 2019-07-16 MED ORDER — POTASSIUM CHLORIDE CRYS ER 20 MEQ PO TBCR: 60.0000 meq | EXTENDED_RELEASE_TABLET | Freq: Every day | ORAL | 1 refills | Status: DC

## 2019-07-16 MED ORDER — CYCLOBENZAPRINE HCL 5 MG PO TABS: 5.0000 mg | ORAL_TABLET | Freq: Three times a day (TID) | ORAL | 1 refills | Status: DC | PRN

## 2019-07-16 MED ORDER — FUROSEMIDE 40 MG PO TABS: ORAL_TABLET | ORAL | 1 refills | Status: DC

## 2019-07-16 NOTE — Patient Instructions (Addendum)
   Can try Flexeril at bedtime as needed for now for muscle spasm.  That can be taken up to 3 times per day but does cause sedation.  Do not drive or operate machinery while taking that medication.  Let me know the neurosurgeon who saw you previously so I can place a referral.  Depending on x-ray can also decide if MRI is needed now or can be discussed with neurosurgery.  Try to minimize use of ibuprofen, Tylenol is a little bit safer.  Keep follow-up with cardiology as planned.  Can return to higher dose of furosemide for now along with higher dose of potassium.  If blood work is not done at cardiology, let me know and I will place an order to have that testing done here.    If you have lab work done today you will be contacted with your lab results within the next 2 weeks.  If you have not heard from [place] then please contact us. The fastest way to get your results is to register for My Chart.   IF you received an x-ray today, you will receive an invoice from [facility]. Please contact [facility] at [phone] with questions or concerns regarding your invoice.   IF you received labwork today, you will receive an invoice from [place]. Please contact [facility] at [phone] with questions or concerns regarding your invoice.   Our billing staff will not be able to assist you with questions regarding bills from these companies.  You will be contacted with the lab results as soon as they are available. The fastest way to get your results is to activate your My Chart account. Instructions are located on the last page of this paperwork. If you have not heard from [place] regarding the results in 2 weeks, please contact this office.

## 2019-07-16 NOTE — Progress Notes (Signed)
Subjective:  Patient ID: Philip Conley, male    DOB: 08-06-1970  Age: 49 y.o. MRN: [id number]  CC:  Chief Complaint  Patient presents with  . Follow-up    on lab work, and back pain. pt reports no improvment of back pain since last visit. 7/10 pain level currently.     HPI Philip Conley presents for   Follow-up from April 23 visit.  Hypertension with peripheral edema Furosemide was increased for 3 days then return to previous regimen.  Compression stockings discussed, referred to cardiology, suspected component of diastolic CHF.  Had been on 80mg  lasix in am. 40mg  in pm at last visit.  Since last visit - tried increase to 80mg  BID, with increased potassium (34meq BID prior, increased to 60mg  BID during higher dosing of furosemide).  Swelling better on higher dose of 80mg  BID. Able to use compression stockings better at higher dose of furosemide. Cardiology appt May 11th with Dr. Harrell Gave.  No side effects on higher dose.   Results for orders placed or performed in visit on 07/06/19  Lipid panel  Result Value Ref Range   Cholesterol, Total 182 100 - 199 mg/dL   Triglycerides 126 0 - 149 mg/dL   HDL 36 (L) >39 mg/dL   VLDL Cholesterol Cal 23 5 - 40 mg/dL   LDL Chol Calc (NIH) 123 (H) 0 - 99 mg/dL   Chol/HDL Ratio 5.1 (H) 0.0 - 5.0 ratio  Comprehensive metabolic panel  Result Value Ref Range   Glucose 97 65 - 99 mg/dL   BUN 13 6 - 24 mg/dL   Creatinine, Ser 1.08 0.76 - 1.27 mg/dL   GFR calc non Af Amer 80 >59 mL/min/1.73   GFR calc Af Amer 92 >59 mL/min/1.73   BUN/Creatinine Ratio 12 9 - 20   Sodium 141 134 - 144 mmol/L   Potassium 4.1 3.5 - 5.2 mmol/L   Chloride 103 96 - 106 mmol/L   CO2 24 20 - 29 mmol/L   Calcium 9.6 8.7 - 10.2 mg/dL   Total Protein 7.0 6.0 - 8.5 g/dL   Albumin 4.6 4.0 - 5.0 g/dL   Globulin, Total 2.4 1.5 - 4.5 g/dL   Albumin/Globulin Ratio 1.9 1.2 - 2.2   Bilirubin Total 0.4 0.0 - 1.2 mg/dL   Alkaline Phosphatase 103 39 - 117 IU/L   AST 25 0 -  40 IU/L   ALT 37 0 - 44 IU/L  Hemoglobin A1c  Result Value Ref Range   Hgb A1c MFr Bld 5.8 (H) 4.8 - 5.6 %   Est. average glucose Bld gHb Est-mCnc 120 mg/dL    Back Pain: Starts in upper neck, radiates down both arms and into upper neck.  Sx's for a few years. Noted after left shoulder injury in 2017.  Had physical therapy for L shoulder  And neck - shoulder pain from DDD in c spine. [facility].  Saw NS - had some injections - worked great. Has gotten worse since that time. Hands numb at times. No weakness, just numbness moving from neck to fingers.  No neck surgery.  Seen by ortho in 2019 - not thought to be carpal tunnel at that time.  Worse past year - more sore to sleep at night. Has treid prednisone in past - some issue with BP on prednisone.  No recent mm relaxer.  Few ibuprofen every few days.    History Patient Active Problem List   Diagnosis Date Noted  . BMI 45.0-49.9, adult ([place]) 03/16/2019  .  Hx of adenomatous and sessile serrated colonic polyps 02/18/2019  . Chronic eczematous otitis externa of both ears 01/27/2018  . Recurrent epistaxis 01/27/2018  . Snoring 01/27/2018  . Circadian rhythm sleep disorder, shift work type 01/11/2018  . Subacute ethmoidal sinusitis 01/11/2018  . OSA (obstructive sleep apnea) 01/11/2018  . Class 3 drug-induced obesity with serious comorbidity and body mass index (BMI) of 45.0 to 49.9 in adult ([place]) 01/11/2018  . Edema of both ankles 01/11/2018  . Pedal edema 11/02/2017  . Venous (peripheral) insufficiency 11/02/2017  . Prediabetes 11/02/2017  . Carpal tunnel syndrome of right wrist 03/28/2017  . Hyperlipidemia with target low density lipoprotein (LDL) cholesterol less than 130 mg/dL 03/31/2012  . Migraine headache 03/31/2012  . Medication monitoring encounter 03/31/2012  . Insomnia 02/25/2012  . Hypertension 02/25/2012   Past Medical History:  Diagnosis Date  . Allergy   . Hx of adenomatous and sessile serrated colonic  polyps 02/18/2019  . Hypertension    Past Surgical History:  Procedure Laterality Date  . HAND SURGERY     Growth removal    Allergies  Allergen Reactions  . Banana Other (See Comments)    Constriction in mouth/throat. tongue swelling.    Prior to Admission medications   Medication Sig Start Date End Date Taking? Authorizing Provider  Aspirin-Acetaminophen-Caffeine (EXCEDRIN MIGRAINE PO) Take by mouth as needed. Reported on 07/05/2015   Yes [provider]  carvedilol (COREG) 6.25 MG tablet Take 1 tablet (6.25 mg total) by mouth 2 (two) times daily with a meal. 07/06/19  Yes Wendie Agreste, MD  cetirizine (ZYRTEC) 10 MG tablet TAKE 1 TABLET(10 MG) BY MOUTH AT BEDTIME 05/09/19  Yes Wendie Agreste, MD  furosemide (LASIX) 40 MG tablet TAKE 2 TABLET BY MOUTH EVERY MORNING AND 1 TABLET BY MOUTH EVERY DAY BEFORE SUPPER 06/16/19  Yes Wendie Agreste, MD  olmesartan (BENICAR) 40 MG tablet TAKE 1 TABLET(40 MG) BY MOUTH DAILY 07/06/19  Yes Wendie Agreste, MD  pantoprazole (PROTONIX) 20 MG tablet TAKE 1 TABLET(20 MG) BY MOUTH DAILY BEFORE SUPPER 07/06/19  Yes Wendie Agreste, MD  potassium chloride SA (KLOR-CON) 20 MEQ tablet TAKE 2 TABLETS(40 MEQ) BY MOUTH TWICE DAILY 07/06/19  Yes Wendie Agreste, MD   Social History   Socioeconomic History  . Marital status: Married    Spouse name: Not on file  . Number of children: 3  . Years of education: Not on file  . Highest education level: Not on file  Occupational History  . Not on file  Tobacco Use  . Smoking status: Never Smoker  . Smokeless tobacco: Never Used  Substance and Sexual Activity  . Alcohol use: Not Currently    Comment: occ  . Drug use: Never  . Sexual activity: Yes    Birth control/protection: None    Comment: number of sex partners in the last 48 months  1  Other Topics Concern  . Not on file  Social History Narrative  . Not on file   Social Determinants of Health   Financial Resource Strain:   .  Difficulty of Paying Living Expenses:   Food Insecurity:   . Worried About Charity fundraiser in the Last Year:   . Arboriculturist in the Last Year:   Transportation Needs:   . Film/video editor (Medical):   Marland Kitchen Lack of Transportation (Non-Medical):   Physical Activity:   . Days of Exercise per Week:   . Minutes of  Exercise per Session:   Stress:   . Feeling of Stress :   Social Connections:   . Frequency of Communication with Friends and Family:   . Frequency of Social Gatherings with Friends and Family:   . Attends Religious Services:   . Active Member of Clubs or Organizations:   . Attends Archivist Meetings:   Marland Kitchen Marital Status:   Intimate Partner Violence:   . Fear of Current or Ex-Partner:   . Emotionally Abused:   Marland Kitchen Physically Abused:   . Sexually Abused:     Review of Systems     Objective:   Vitals:   07/16/19 1541  BP: (!) 152/96  Pulse: 83  Temp: 98.4 F (36.9 C)  TempSrc: Temporal  SpO2: 96%  Weight: 294 lb (133.4 kg)  Height: 5\' 4"  (1.626 m)     Physical Exam Vitals reviewed.  Constitutional:      Appearance: He is well-developed.  HENT:     Head: Normocephalic and atraumatic.  Eyes:     Pupils: Pupils are equal, round, and reactive to light.  Neck:     Vascular: No carotid bruit or JVD.  Cardiovascular:     Rate and Rhythm: Normal rate and regular rhythm.     Heart sounds: Normal heart sounds. No murmur.  Pulmonary:     Effort: Pulmonary effort is normal.     Breath sounds: Normal breath sounds. No rales.  Musculoskeletal:     Right lower leg: Edema (1-2+ prox 1/3 tibia. ) present.     Left lower leg: Edema present.     Comments: C spine: Decreased range of motion approximately 45 degrees rotation, lateral flexion.  Decreased extension.  Slight discomfort centrally over the mid C-spine lower, spasm left greater than right paraspinals to the trapezius.  Upper arm strength, grip strength was equal bilaterally and full.  2+  biceps, brachioradialis reflexes, difficulty with triceps reflex.  Skin:    General: Skin is warm and dry.  Neurological:     General: No focal deficit present.     Mental Status: He is alert and oriented to person, place, and time.     DG Cervical Spine Complete  Result Date: 07/16/2019 CLINICAL DATA:  Back and neck pain. EXAM: CERVICAL SPINE - COMPLETE 4+ VIEW COMPARISON:  06/21/2009 FINDINGS: The alignment of the cervical spine appears normal. There is no fracture or dislocation identified. Disc space narrowing and endplate spurring noted at C5-6 and C6-7. Narrowing of the left C6-7 and C7-T1 neural foramina noted. On the right there is mild narrowing of the C 7 T1 neural foramina IMPRESSION: 1. No acute findings. 2. Cervical spondylosis with bilateral neural foraminal narrowing. Electronically Signed   By: Kerby Moors M.D.   On: 07/16/2019 16:22      Assessment & Plan:  Philip Conley is a 49 y.o. male . Pain, neck - Plan: DG Cervical Spine Complete, cyclobenzaprine (FLEXERIL) 5 MG tablet Cervical radicular pain  -Suspected cervical radiculopathy based on exam, imaging.  Start Flexeril, potential side effects discussed.  Would recommend evaluation with neurosurgery, he will let me know previous neurosurgeon's name so I can place referral.  Consider advanced imaging versus deferring to neurosurgery.  RTC/ER precautions.   Peripheral edema - Plan: furosemide (LASIX) 40 MG tablet, potassium chloride SA (KLOR-CON) 20 MEQ tablet, cyclobenzaprine (FLEXERIL) 5 MG tablet  -Improved with higher dosing of furosemide, will return to 80 mg twice daily.  Higher dose of potassium also ordered, follow-up with  cardiology as planned  Meds ordered this encounter  Medications  . furosemide (LASIX) 40 MG tablet    Sig: TAKE 2 TABLET BY MOUTH EVERY MORNING AND 1 TABLET BY MOUTH EVERY DAY BEFORE SUPPER    Dispense:  360 tablet    Refill:  1  . potassium chloride SA (KLOR-CON) 20 MEQ tablet    Sig:  Take 3 tablets (60 mEq total) by mouth daily. TAKE 2 TABLETS(40 MEQ) BY MOUTH TWICE DAILY    Dispense:  540 tablet    Refill:  1  . cyclobenzaprine (FLEXERIL) 5 MG tablet    Sig: Take 1 tablet (5 mg total) by mouth 3 (three) times daily as needed for muscle spasms (start qhs prn due to sedation).    Dispense:  15 tablet    Refill:  1   Patient Instructions     Can try Flexeril at bedtime as needed for now for muscle spasm.  That can be taken up to 3 times per day but does cause sedation.  Do not drive or operate machinery while taking that medication.  Let me know the neurosurgeon who saw you previously so I can place a referral.  Depending on x-ray can also decide if MRI is needed now or can be discussed with neurosurgery.  Try to minimize use of ibuprofen, Tylenol is a little bit safer.  Keep follow-up with cardiology as planned.  Can return to higher dose of furosemide for now along with higher dose of potassium.  If blood work is not done at cardiology, let me know and I will place an order to have that testing done here.    If you have lab work done today you will be contacted with your lab results within the next 2 weeks.  If you have not heard from [place] then please contact us. The fastest way to get your results is to register for My Chart.   IF you received an x-ray today, you will receive an invoice from [facility]. Please contact [facility] at [phone] with questions or concerns regarding your invoice.   IF you received labwork today, you will receive an invoice from [place]. Please contact [facility] at [phone] with questions or concerns regarding your invoice.   Our billing staff will not be able to assist you with questions regarding bills from these companies.  You will be contacted with the lab results as soon as they are available. The fastest way to get your results is to activate your My Chart account. Instructions are located on the last page of  this paperwork. If you have not heard from [place] regarding the results in 2 weeks, please contact this office.         Signed, Merri Ray, MD [facility]

## 2019-07-16 NOTE — Telephone Encounter (Signed)
FYI:Pt would like Dr. Carlota Raspberry to  know he saw Dr. Patrice Paradise  at [facility].

## 2019-07-17 ENCOUNTER — Encounter: Payer: Self-pay | Admitting: Family Medicine

## 2019-07-24 ENCOUNTER — Other Ambulatory Visit: Payer: Self-pay

## 2019-07-24 ENCOUNTER — Encounter: Payer: Self-pay | Admitting: Cardiology

## 2019-07-24 ENCOUNTER — Ambulatory Visit (INDEPENDENT_AMBULATORY_CARE_PROVIDER_SITE_OTHER): Payer: BC Managed Care – PPO | Admitting: Cardiology

## 2019-07-24 VITALS — BP 136/88 | HR 77 | Ht 66.0 in | Wt 295.2 lb

## 2019-07-24 DIAGNOSIS — R600 Localized edema: Secondary | ICD-10-CM

## 2019-07-24 DIAGNOSIS — Z7189 Other specified counseling: Secondary | ICD-10-CM

## 2019-07-24 DIAGNOSIS — I872 Venous insufficiency (chronic) (peripheral): Secondary | ICD-10-CM | POA: Diagnosis not present

## 2019-07-24 DIAGNOSIS — I10 Essential (primary) hypertension: Secondary | ICD-10-CM

## 2019-07-24 NOTE — Progress Notes (Signed)
Cardiology Office Note:    Date:  07/24/2019   ID:  Philip Conley, DOB 1970/06/02, MRN [id number]  PCP:  Wendie Agreste, MD  Cardiologist:  Buford Dresser, MD  Referring MD: Wendie Agreste, MD   CC: new patient consultation for the evaluation and management of hypertension and edema.  History of Present Illness:    Philip Conley is a 49 y.o. male with a hx of hypertension, LE edema who is seen as a new consult at the request of Wendie Agreste, MD for the evaluation and management of hypertension with edema.  Visit dated 07/16/19 with Dr. Carlota Raspberry reviewed today. Has been using compression stockings, furosemide with improvement. No echo in the system, but component of diastolic heart failure suspected.  His biggest concern is leg edema. Intermittent, has been going on for the last 4-5 years. For a time, his lasix was stopped due to concern for kidney damage long term. This drastically increased his LE edema. Came with bloating, weight gain as well. Has restarted the lasix with Dr. Carlota Raspberry about 6 weeks ago.  Lowest weight was 274 lbs over the winter. Seemed to jump up 15 lbs rapidly, clothes were tighter. No significant change in his diet at that time. Tries to wear compression stockings, but sometimes his legs are too tight and he cannot tolerate them.  Saw vein specialist about 18 mos ago, had ultrasounds, told it was normal. On my review, there are comments re: chronic venous insufficiency in the deep venous system.  He has no history of heart disease himself. His father has a history of heart issues, has a cardiologist here for a history of stroke, syncope.  Denies chest pain, shortness of breath at rest or with normal exertion. No PND, orthopnea. No syncope or palpitations.  Past Medical History:  Diagnosis Date  . Allergy   . Hx of adenomatous and sessile serrated colonic polyps 02/18/2019  . Hypertension     Past Surgical History:  Procedure Laterality Date  . HAND  SURGERY     Growth removal     Current Medications: Current Outpatient Medications on File Prior to Visit  Medication Sig  . Aspirin-Acetaminophen-Caffeine (EXCEDRIN MIGRAINE PO) Take by mouth as needed. Reported on 07/05/2015  . carvedilol (COREG) 6.25 MG tablet Take 1 tablet (6.25 mg total) by mouth 2 (two) times daily with a meal.  . cetirizine (ZYRTEC) 10 MG tablet TAKE 1 TABLET(10 MG) BY MOUTH AT BEDTIME  . cyclobenzaprine (FLEXERIL) 5 MG tablet Take 1 tablet (5 mg total) by mouth 3 (three) times daily as needed for muscle spasms (start qhs prn due to sedation).  . furosemide (LASIX) 40 MG tablet TAKE 2 TABLET BY MOUTH EVERY MORNING AND 1 TABLET BY MOUTH EVERY DAY BEFORE SUPPER  . olmesartan (BENICAR) 40 MG tablet TAKE 1 TABLET(40 MG) BY MOUTH DAILY  . pantoprazole (PROTONIX) 20 MG tablet TAKE 1 TABLET(20 MG) BY MOUTH DAILY BEFORE SUPPER  . potassium chloride SA (KLOR-CON) 20 MEQ tablet Take 3 tablets (60 mEq total) by mouth daily. TAKE 2 TABLETS(40 MEQ) BY MOUTH TWICE DAILY   Current Facility-Administered Medications on File Prior to Visit  Medication  . 0.9 %  sodium chloride infusion     Allergies:   Banana   Social History   Tobacco Use  . Smoking status: Never Smoker  . Smokeless tobacco: Never Used  Substance Use Topics  . Alcohol use: Not Currently    Comment: occ  . Drug use: Never  Family History: family history includes Heart disease in his maternal grandmother, mother, and paternal grandmother; Hypertension in his mother; Lung disease in his maternal grandfather; Lupus in his father; Stroke in his father and paternal grandfather. There is no history of Colon polyps, Esophageal cancer, Rectal cancer, or Stomach cancer.  ROS:   Please see the history of present illness.  Additional pertinent ROS: Constitutional: Negative for chills, fever, night sweats, unintentional weight loss  HENT: Negative for ear pain and hearing loss.   Eyes: Negative for loss of vision  and eye pain.  Respiratory: Negative for cough, sputum, wheezing.   Cardiovascular: See HPI. Gastrointestinal: Negative for abdominal pain, melena, and hematochezia.  Genitourinary: Negative for dysuria and hematuria.  Musculoskeletal: Negative for falls and myalgias.  Skin: Negative for itching and rash.  Neurological: Negative for focal weakness, focal sensory changes and loss of consciousness.  Endo/Heme/Allergies: Does not bruise/bleed easily.     EKGs/Labs/Other Studies Reviewed:    The following studies were reviewed today: No prior cardiac studies  EKG:  EKG is personally reviewed.  The ekg ordered today demonstrates NSR at 77 bpm  Recent Labs: 12/08/2018: Hemoglobin 15.3; Platelets 261; TSH 1.210 07/06/2019: ALT 37; BUN 13; Creatinine, Ser 1.08; Potassium 4.1; Sodium 141  Recent Lipid Panel    Component Value Date/Time   CHOL 182 07/06/2019 1039   TRIG 126 07/06/2019 1039   HDL 36 (L) 07/06/2019 1039   CHOLHDL 5.1 (H) 07/06/2019 1039   CHOLHDL 5.7 01/25/2012 1107   VLDL 26 01/25/2012 1107   LDLCALC 123 (H) 07/06/2019 1039    Physical Exam:    VS:  BP 136/88   Pulse 77   Ht 5\' 6"  (1.676 m)   Wt 295 lb 3.2 oz (133.9 kg)   BMI 47.65 kg/m     Wt Readings from Last 3 Encounters:  07/24/19 295 lb 3.2 oz (133.9 kg)  07/16/19 294 lb (133.4 kg)  07/06/19 293 lb (132.9 kg)    GEN: Well nourished, well developed in no acute distress HEENT: Normal, moist mucous membranes NECK: No JVD CARDIAC: regular rhythm, normal S1 and S2, no rubs or gallops. No murmurs. VASCULAR: Radial and DP pulses 2+ bilaterally. No carotid bruits RESPIRATORY:  Clear to auscultation without rales, wheezing or rhonchi  ABDOMEN: Soft, non-tender, non-distended MUSCULOSKELETAL:  Ambulates independently SKIN: Warm and dry, with brawny bilateral LE edema and skin discoloration NEUROLOGIC:  Alert and oriented x 3. No focal neuro deficits noted. PSYCHIATRIC:  Normal affect    ASSESSMENT:    1.  Lower extremity edema   2. Essential hypertension   3. Venous (peripheral) insufficiency   4. Cardiac risk counseling   5. Counseling on health promotion and disease prevention    PLAN:    Lower extremity edema: -has not had echo to exclude systolic or diastolic dysfunction, will order today -on my review of vascular studies, there is mention of chronic venous insufficiency. If echo unremarkable, this may likely be cause of chronic edema.   Hypertension: BP today just over 130/80 -instructed on lifestyle changes today, including weight loss -for now will continue carvedilol 6/26 mg BID, lasix 80 mg in the AM/40 mg in the PM, and olmesartan 40 mg daily -if remains elevated at follow up, will adjust regimen  Cardiac risk counseling and prevention recommendations: -recommend heart healthy/Mediterranean diet, with whole grains, fruits, vegetable, fish, lean meats, nuts, and olive oil. Limit salt. -recommend moderate walking, 3-5 times/week for 30-50 minutes each session. Aim for at least  150 minutes.week. Goal should be Conley of 3 miles/hours, or walking 1.5 miles in 30 minutes -recommend avoidance of tobacco products. Avoid excess alcohol. -Additional risk factor control:  -Diabetes risk: A1c is 5.8 per [facility]. denies history of diabetes  -Lipids: per [facility], lipids 07/06/19 show Tchol 182, HDL 36, TG 126. LDL not noted  -Weight: BMI 47, would benefit from weight loss, discussed today -ASCVD risk score: The 10-year ASCVD risk score Mikey Bussing DC Brooke Bonito., et al., 2013) is: 5.6%   Values used to calculate the score:     Age: 20 years     Sex: Male     Is Non-Hispanic African American: No     Diabetic: No     Tobacco smoker: No     Systolic Blood Pressure: [id number] mmHg     Is BP treated: Yes     HDL Cholesterol: 36 mg/dL     Total Cholesterol: 182 mg/dL    Plan for follow up: 3 mos  Buford Dresser, MD, PhD [place]  [facility]    Medication Adjustments/Labs and Tests Ordered: Current  medicines are reviewed at length with the patient today.  Concerns regarding medicines are outlined above.  Orders Placed This Encounter  Procedures  . EKG 12-Lead  . ECHOCARDIOGRAM COMPLETE   No orders of the defined types were placed in this encounter.   Patient Instructions  Medication Instructions:  Your Physician recommend you continue on your current medication as directed.    *If you need a refill on your cardiac medications before your next appointment, please call your pharmacy*   Lab Work: None   Testing/Procedures: Your physician has requested that you have an echocardiogram. Echocardiography is a painless test that uses sound waves to create images of your heart. It provides your doctor with information about the size and shape of your heart and how well your heart's chambers and valves are working. This procedure takes approximately one hour. There are no restrictions for this procedure. [address]    Follow-Up: At [facility], you and your health needs are our priority.  As part of our continuing mission to provide you with exceptional heart care, we have created designated Provider Care Teams.  These Care Teams include your primary Cardiologist (physician) and Advanced Practice Providers (APPs -  Physician Assistants and Nurse Practitioners) who all work together to provide you with the care you need, when you need it.  We recommend signing up for the patient portal called "MyChart".  Sign up information is provided on this After Visit Summary.  MyChart is used to connect with patients for Virtual Visits (Telemedicine).  Patients are able to view lab/test results, encounter notes, upcoming appointments, etc.  Non-urgent messages can be sent to your provider as well.   To learn more about what you can do with MyChart, go to [email or web address].    Your next appointment:   3 month(s)  The format for your next appointment:   In  Person  Provider:   Buford Dresser, MD      Signed, Buford Dresser, MD PhD 07/24/2019  [place]

## 2019-07-24 NOTE — Patient Instructions (Addendum)
Medication Instructions:  Your Physician recommend you continue on your current medication as directed.    *If you need a refill on your cardiac medications before your next appointment, please call your pharmacy*   Lab Work: None   Testing/Procedures: Your physician has requested that you have an echocardiogram. Echocardiography is a painless test that uses sound waves to create images of your heart. It provides your doctor with information about the size and shape of your heart and how well your heart's chambers and valves are working. This procedure takes approximately one hour. There are no restrictions for this procedure. [address]    Follow-Up: At [facility], you and your health needs are our priority.  As part of our continuing mission to provide you with exceptional heart care, we have created designated Provider Care Teams.  These Care Teams include your primary Cardiologist (physician) and Advanced Practice Providers (APPs -  Physician Assistants and Nurse Practitioners) who all work together to provide you with the care you need, when you need it.  We recommend signing up for the patient portal called "MyChart".  Sign up information is provided on this After Visit Summary.  MyChart is used to connect with patients for Virtual Visits (Telemedicine).  Patients are able to view lab/test results, encounter notes, upcoming appointments, etc.  Non-urgent messages can be sent to your provider as well.   To learn more about what you can do with MyChart, go to [email or web address].    Your next appointment:   3 month(s)  The format for your next appointment:   In Person  Provider:   Bridgette Christopher, MD    

## 2019-08-08 ENCOUNTER — Other Ambulatory Visit: Payer: Self-pay | Admitting: Family Medicine

## 2019-08-08 DIAGNOSIS — J309 Allergic rhinitis, unspecified: Secondary | ICD-10-CM

## 2019-08-12 ENCOUNTER — Other Ambulatory Visit: Payer: Self-pay | Admitting: Family Medicine

## 2019-08-12 DIAGNOSIS — M542 Cervicalgia: Secondary | ICD-10-CM

## 2019-08-12 DIAGNOSIS — R609 Edema, unspecified: Secondary | ICD-10-CM

## 2019-08-14 NOTE — Telephone Encounter (Signed)
Requested medication (s) are due for refill today: yes  Requested medication (s) are on the active medication list: yes  Last refill:  07/24/19  Future visit scheduled: no  Notes to clinic:  not delegated    Requested Prescriptions  Pending Prescriptions Disp Refills   cyclobenzaprine (FLEXERIL) 5 MG tablet [Pharmacy Med Name: CYCLOBENZAPRINE 5MG  TABLETS] 15 tablet 1    Sig: TAKE 1 TABLET(5 MG) BY MOUTH THREE TIMES DAILY AS NEEDED FOR MUSCLE SPASMS. START EVERY NIGHT AT BEDTIME AS NEEDED DUE TO SEDATION      Not Delegated - Analgesics:  Muscle Relaxants Failed - 08/12/2019 11:13 PM      Failed - This refill cannot be delegated      Passed - Valid encounter within last 6 months    Recent Outpatient Visits           4 weeks ago Pain, neck   [facility] [place], MD   1 month ago Essential hypertension   [facility] [place], MD   7 months ago Essential hypertension   [facility], Lilia Argue, MD   8 months ago Essential hypertension   [facility], Lilia Argue, MD   1 year ago Essential hypertension   [facility], Ranell Patrick, MD       Future Appointments             In 2 months Buford Dresser, MD [facility] [place], [facility]

## 2019-08-15 ENCOUNTER — Ambulatory Visit (HOSPITAL_COMMUNITY): Payer: BC Managed Care – PPO | Attending: Cardiology

## 2019-08-15 ENCOUNTER — Other Ambulatory Visit: Payer: Self-pay

## 2019-08-15 DIAGNOSIS — R600 Localized edema: Secondary | ICD-10-CM | POA: Insufficient documentation

## 2019-08-15 MED ORDER — PERFLUTREN LIPID MICROSPHERE
1.0000 mL | INTRAVENOUS | Status: AC | PRN
  Administered 2019-08-15: 1 mL via INTRAVENOUS

## 2019-09-13 ENCOUNTER — Encounter: Payer: Self-pay | Admitting: Cardiology

## 2019-09-18 ENCOUNTER — Other Ambulatory Visit: Payer: Self-pay | Admitting: Family Medicine

## 2019-09-18 DIAGNOSIS — M542 Cervicalgia: Secondary | ICD-10-CM

## 2019-09-18 DIAGNOSIS — R609 Edema, unspecified: Secondary | ICD-10-CM

## 2019-09-18 NOTE — Telephone Encounter (Signed)
Requested medication (s) are due for refill today: yes  Requested medication (s) are on the active medication list:yes  Last refill: 08/14/2019   # 15  1 refill  Future visit scheduled no  Notes to clinic: Not delegated  Requested Prescriptions  Pending Prescriptions Disp Refills   cyclobenzaprine (FLEXERIL) 5 MG tablet [Pharmacy Med Name: CYCLOBENZAPRINE 5MG  TABLETS] 15 tablet 1    Sig: TAKE 1 TABLET(5 MG) BY MOUTH THREE TIMES DAILY AS NEEDED FOR MUSCLE SPASMS. START EVERY NIGHT AT BEDTIME AS NEEDED DUE TO SEDATION      Not Delegated - Analgesics:  Muscle Relaxants Failed - 09/18/2019  5:50 PM      Failed - This refill cannot be delegated      Passed - Valid encounter within last 6 months    Recent Outpatient Visits           2 months ago Pain, neck   [facility] [place], MD   2 months ago Essential hypertension   [facility] [place], MD   8 months ago Essential hypertension   [facility], Lilia Argue, MD   9 months ago Essential hypertension   [facility], Lilia Argue, MD   1 year ago Essential hypertension   [facility], Ranell Patrick, MD       Future Appointments             In 1 month Buford Dresser, MD [facility] [place], [facility]

## 2019-10-26 ENCOUNTER — Ambulatory Visit: Payer: BC Managed Care – PPO | Admitting: Cardiology

## 2019-11-02 DIAGNOSIS — Z20828 Contact with and (suspected) exposure to other viral communicable diseases: Secondary | ICD-10-CM | POA: Diagnosis not present

## 2019-11-15 NOTE — Progress Notes (Signed)
Cardiology Office Note   Date:  11/16/2019   ID:  Philip Conley, DOB Jan 10, 1971, MRN [id number]  PCP:  Philip Agreste, MD  Cardiologist: Dr.Christopher  CC: Follow Up    History of Present Illness: Philip Conley is a 49 y.o. male who presents for ongoing assessment and management of hypertension, chronic lower extremity edema, who was seen as a new consult by Dr. Harrell Gave on 07/24/2019.  He was seen primarily for lower extremity edema which was intermittent and been going on for 4 to 5 years.  He had been on Lasix but this was stopped due to a concern for kidney damage over a long period of time using diuretic.  Dr. Harrell Gave noted that his lowest weight was 274 pounds over the winter, but seem to jump up 15 pounds rapidly and his clothes were tighter.  He had no significant change in his diet at that time.  Was also noted that the patient had a history of chronic venous insufficiency.  Echocardiogram was ordered to evaluate for LV function.  The patient was to continue carvedilol 6.25 mg twice daily, Lasix 80 mg in the a.m. and 40 mg in the p.m. and olmesartan 40 mg daily.  Dr. Harrell Gave reviewed the echocardiogram on 08/15/2019. Normal squeeze and relaxation of the heart muscle. Normal pressures, normal valves. No cardiac cause of leg swelling seen. This would suggest the swelling is not coming from the heart, may be leaky vessels in the legs or a non-vessel cause. Recommend compression stockings, elevation, exercise to try to help the veins in the legs be less likely to swell.   He comes today with continued complaints of lower extremity edema which has occurred over the last few days. He admits to eating high salt foods to include pizza and bread sticks within the last few days. His weight remains stable. He denies palpitations, chest discomfort, or any dyspnea on exertion that is different from baseline. He is getting good results from his diuretic which is now been increased in dose to 80  mg twice daily along with increased dose of potassium.  Past Medical History:  Diagnosis Date  . Allergy   . Hx of adenomatous and sessile serrated colonic polyps 02/18/2019  . Hypertension     Past Surgical History:  Procedure Laterality Date  . HAND SURGERY     Growth removal      Current Outpatient Medications  Medication Sig Dispense Refill  . Aspirin-Acetaminophen-Caffeine (EXCEDRIN MIGRAINE PO) Take by mouth as needed. Reported on 07/05/2015    . carvedilol (COREG) 6.25 MG tablet Take 1 tablet (6.25 mg total) by mouth 2 (two) times daily with a meal. 180 tablet 1  . cetirizine (ZYRTEC) 10 MG tablet TAKE 1 TABLET(10 MG) BY MOUTH AT BEDTIME 90 tablet 1  . cyclobenzaprine (FLEXERIL) 5 MG tablet TAKE 1 TABLET(5 MG) BY MOUTH THREE TIMES DAILY AS NEEDED FOR MUSCLE SPASMS. START EVERY NIGHT AT BEDTIME AS NEEDED DUE TO SEDATION 15 tablet 1  . furosemide (LASIX) 40 MG tablet Take 2 tablets (80 mg total) by mouth 2 (two) times daily. 360 tablet 1  . olmesartan (BENICAR) 40 MG tablet TAKE 1 TABLET(40 MG) BY MOUTH DAILY 90 tablet 1  . pantoprazole (PROTONIX) 20 MG tablet TAKE 1 TABLET(20 MG) BY MOUTH DAILY BEFORE SUPPER 90 tablet 1  . potassium chloride SA (KLOR-CON) 20 MEQ tablet Take 3 tablets (60 mEq total) by mouth 2 (two) times daily. 540 tablet 1   Current Facility-Administered Medications  Medication  Dose Route Frequency Provider Last Rate Last Admin  . 0.9 %  sodium chloride infusion  500 mL Intravenous Once Gatha Mayer, MD        Allergies:   Banana    Social History:  The patient  reports that he has never smoked. He has never used smokeless tobacco. He reports previous alcohol use. He reports that he does not use drugs.   Family History:  The patient's family history includes Heart disease in his maternal grandmother, mother, and paternal grandmother; Hypertension in his mother; Lung disease in his maternal grandfather; Lupus in his father; Stroke in his father and  paternal grandfather.    ROS: All other systems are reviewed and negative. Unless otherwise mentioned in H&P    PHYSICAL EXAM: VS:  BP 118/68   Pulse 82   Ht 5\' 6"  (1.676 m)   Wt 299 lb (135.6 kg)   SpO2 97%   BMI 48.26 kg/m  , BMI Body mass index is 48.26 kg/m. GEN: Well nourished, well developed, in no acute distress, morbidly obese HEENT: normal Neck: no JVD, carotid bruits, or masses Cardiac: RRR; soft 1/6 systolic murmurs, rubs, or gallops, 1+ to 2+ dependent edema  Respiratory:  Clear to auscultation bilaterally, normal work of breathing GI: soft, nontender, nondistended, + BS, central obesity MS: no deformity or atrophy Skin: warm and dry, no rash Neuro:  Strength and sensation are intact Psych: euthymic mood, full affect   EKG: Not completed this office visit.  Recent Labs: 12/08/2018: Hemoglobin 15.3; Platelets 261; TSH 1.210 07/06/2019: ALT 37; BUN 13; Creatinine, Ser 1.08; Potassium 4.1; Sodium 141    Lipid Panel    Component Value Date/Time   CHOL 182 07/06/2019 1039   TRIG 126 07/06/2019 1039   HDL 36 (L) 07/06/2019 1039   CHOLHDL 5.1 (H) 07/06/2019 1039   CHOLHDL 5.7 01/25/2012 1107   VLDL 26 01/25/2012 1107   LDLCALC 123 (H) 07/06/2019 1039      Wt Readings from Last 3 Encounters:  11/16/19 299 lb (135.6 kg)  07/24/19 295 lb 3.2 oz (133.9 kg)  07/16/19 294 lb (133.4 kg)      Other studies Reviewed: Echocardiogram 09/02/19 1. Left ventricular ejection fraction, by estimation, is 60 to 65%. The  left ventricle has normal function. The left ventricle has no regional  wall motion abnormalities. Left ventricular diastolic parameters were  normal.  2. Right ventricular systolic function is normal. The right ventricular  size is normal. There is normal pulmonary artery systolic pressure.  3. The mitral valve is normal in structure. No evidence of mitral valve  regurgitation. No evidence of mitral stenosis.  4. The aortic valve is normal in  structure. Aortic valve regurgitation is  not visualized. No aortic stenosis is present.  5. The inferior vena cava is normal in size with greater than 50%  respiratory variability, suggesting right atrial pressure of 3 mmHg.    ASSESSMENT AND PLAN:  1. Venous insufficiency: Echocardiogram did not reveal any reduced EF, or diastolic dysfunction and therefore lower extremity edema is likely related to venous insufficiency. I have counseled him on low-sodium diet, and given him a copy of the "Salty Six" and asked him to continue to be active. He is again recommended to wear support hose to assist with dependent edema.  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 I have also suggested weight loss to assist with lower extremity edema. He states that he had lost  30 pounds but it came right back on. He may need to speak with his primary care physician for more intensive dietary counseling and/or referral to bariatric.  2. Hypertension: Blood pressure is well controlled today. Would not make any changes in his regimen at this time. He is asymptomatic concerning his blood pressure status.   Current medicines are reviewed at length with the patient today.  I have spent 25 minutes dedicated to the care of this patient on the date of this encounter to include pre-visit review of records, assessment, management and diagnostic testing,with shared decision making.  Labs/ tests ordered today include: None   Phill Myron. West Pugh, ANP, AACC   11/16/2019 12:16 PM    [facility] [place] [address] Office [phone] Fax [phone]  Notice: This dictation was prepared with Dragon dictation along with smaller phrase technology. Any transcriptional errors that result from this process are unintentional and may not be corrected upon review.

## 2019-11-16 ENCOUNTER — Other Ambulatory Visit: Payer: Self-pay

## 2019-11-16 ENCOUNTER — Encounter: Payer: Self-pay | Admitting: Adult Health

## 2019-11-16 ENCOUNTER — Ambulatory Visit (INDEPENDENT_AMBULATORY_CARE_PROVIDER_SITE_OTHER): Payer: BC Managed Care – PPO | Admitting: Adult Health

## 2019-11-16 VITALS — BP 118/68 | HR 82 | Ht 66.0 in | Wt 299.0 lb

## 2019-11-16 DIAGNOSIS — R609 Edema, unspecified: Secondary | ICD-10-CM | POA: Diagnosis not present

## 2019-11-16 DIAGNOSIS — I10 Essential (primary) hypertension: Secondary | ICD-10-CM

## 2019-11-16 DIAGNOSIS — I872 Venous insufficiency (chronic) (peripheral): Secondary | ICD-10-CM | POA: Diagnosis not present

## 2019-11-16 MED ORDER — FUROSEMIDE 40 MG PO TABS: 80.0000 mg | ORAL_TABLET | Freq: Two times a day (BID) | ORAL | 1 refills | Status: DC

## 2019-11-16 MED ORDER — POTASSIUM CHLORIDE CRYS ER 20 MEQ PO TBCR: 60.0000 meq | EXTENDED_RELEASE_TABLET | Freq: Two times a day (BID) | ORAL | 1 refills | Status: DC

## 2019-11-16 NOTE — Patient Instructions (Addendum)
Medication Instructions:  Your physician recommends that you continue on your current medications as directed. Please refer to the Current Medication list given to you today.  *If you need a refill on your cardiac medications before your next appointment, please call your pharmacy*   Lab Work: [place]   If you have labs (blood work) drawn today and your tests are completely normal, you will receive your results only by: Marland Kitchen MyChart Message (if you have MyChart) OR . A paper copy in the mail If you have any lab test that is abnormal or we need to change your treatment, we will call you to review the results.   Testing/Procedures: NONE ORDERED  TODAY   Follow-Up: At [facility], you and your health needs are our priority.  As part of our continuing mission to provide you with exceptional heart care, we have created designated Provider Care Teams.  These Care Teams include your primary Cardiologist (physician) and Advanced Practice Providers (APPs -  Physician Assistants and Nurse Practitioners) who all work together to provide you with the care you need, when you need it.  We recommend signing up for the patient portal called "MyChart".  Sign up information is provided on this After Visit Summary.  MyChart is used to connect with patients for Virtual Visits (Telemedicine).  Patients are able to view lab/test results, encounter notes, upcoming appointments, etc.  Non-urgent messages can be sent to your provider as well.   To learn more about what you can do with MyChart, go to [email or web address].    Your next appointment:   1 year(s)  The format for your next appointment:   In Person  Provider:   You may see Buford Dresser, MD or one of the following Advanced Practice Providers on your designated Care Team:    Rosaria Ferries, PA-C  Jory Sims, DNP, ANP  Cadence Kathlen Mody, PA-C    Other Instructions

## 2019-12-02 ENCOUNTER — Other Ambulatory Visit: Payer: Self-pay | Admitting: Family Medicine

## 2019-12-02 DIAGNOSIS — I10 Essential (primary) hypertension: Secondary | ICD-10-CM

## 2020-02-03 ENCOUNTER — Other Ambulatory Visit: Payer: Self-pay | Admitting: Family Medicine

## 2020-02-03 DIAGNOSIS — M542 Cervicalgia: Secondary | ICD-10-CM

## 2020-02-03 DIAGNOSIS — R609 Edema, unspecified: Secondary | ICD-10-CM

## 2020-02-03 DIAGNOSIS — I10 Essential (primary) hypertension: Secondary | ICD-10-CM

## 2020-02-03 DIAGNOSIS — J309 Allergic rhinitis, unspecified: Secondary | ICD-10-CM

## 2020-02-03 NOTE — Telephone Encounter (Signed)
Requested medication (s) are due for refill today: no  Requested medication (s) are on the active medication list: yes  Last refill:  12/18/2019  Future visit scheduled: no  Notes to clinic: this refill cannot be delegated    Requested Prescriptions  Pending Prescriptions Disp Refills   cyclobenzaprine (FLEXERIL) 5 MG tablet [Pharmacy Med Name: CYCLOBENZAPRINE 5MG  TABLETS] 15 tablet 1    Sig: TAKE 1 TABLET(5 MG) BY MOUTH THREE TIMES DAILY AS NEEDED FOR MUSCLE SPASMS. START EVERY NIGHT AT BEDTIME AS NEEDED DUE TO SEDATION      Not Delegated - Analgesics:  Muscle Relaxants Failed - 02/03/2020  8:42 AM      Failed - This refill cannot be delegated      Failed - Valid encounter within last 6 months    Recent Outpatient Visits           6 months ago Pain, neck   [facility], Ranell Patrick, MD   7 months ago Essential hypertension   [facility], Ranell Patrick, MD   1 year ago Essential hypertension   [facility], Lilia Argue, MD   1 year ago Essential hypertension   [facility], Lilia Argue, MD   1 year ago Essential hypertension   [facility], Ranell Patrick, MD

## 2020-02-03 NOTE — Telephone Encounter (Signed)
Requested medication (s) are due for refill today: yes   Requested medication (s) are on the active medication list: yes   Last refill:  12/01/2019  Future visit scheduled:No  Notes to clinic:  overdue for office visit    Requested Prescriptions  Pending Prescriptions Disp Refills   olmesartan (BENICAR) 40 MG tablet [Pharmacy Med Name: OLMESARTAN MEDOXOMIL 40MG  TABLETS] 90 tablet 1    Sig: TAKE 1 TABLET(40 MG) BY MOUTH DAILY      Cardiovascular:  Angiotensin Receptor Blockers Failed - 02/03/2020  8:27 AM      Failed - Cr in normal range and within 180 days    Creat  Date Value Ref Range Status  04/07/2015 1.00 0.60 - 1.35 mg/dL Final   Creatinine, Ser  Date Value Ref Range Status  07/06/2019 1.08 0.76 - 1.27 mg/dL Final          Failed - K in normal range and within 180 days    Potassium  Date Value Ref Range Status  07/06/2019 4.1 3.5 - 5.2 mmol/L Final          Failed - Valid encounter within last 6 months    Recent Outpatient Visits           6 months ago Pain, neck   [facility], Ranell Patrick, MD   7 months ago Essential hypertension   [facility], Ranell Patrick, MD   1 year ago Essential hypertension   [facility], Lilia Argue, MD   1 year ago Essential hypertension   [facility], Lilia Argue, MD   1 year ago Essential hypertension   [facility], Ranell Patrick, MD              Passed - Patient is not pregnant      Passed - Last BP in normal range    BP Readings from Last 1 Encounters:  11/16/19 118/68            cetirizine (ZYRTEC) 10 MG tablet [Pharmacy Med Name: CETIRIZINE 10MG  TABLETS] 90 tablet 1    Sig: TAKE 1 TABLET(10 MG) BY MOUTH AT BEDTIME      Ear, Nose, and Throat:  Antihistamines Passed - 02/03/2020  8:27 AM      Passed - Valid encounter within last 12 months    Recent Outpatient Visits           6 months ago Pain, neck   [facility],  Ranell Patrick, MD   7 months ago Essential hypertension   [facility], Ranell Patrick, MD   1 year ago Essential hypertension   [facility], Lilia Argue, MD   1 year ago Essential hypertension   [facility], Lilia Argue, MD   1 year ago Essential hypertension   [facility], Ranell Patrick, MD

## 2020-05-02 ENCOUNTER — Other Ambulatory Visit: Payer: Self-pay | Admitting: Family Medicine

## 2020-05-02 DIAGNOSIS — R609 Edema, unspecified: Secondary | ICD-10-CM

## 2020-05-02 DIAGNOSIS — M542 Cervicalgia: Secondary | ICD-10-CM

## 2020-05-02 DIAGNOSIS — K219 Gastro-esophageal reflux disease without esophagitis: Secondary | ICD-10-CM

## 2020-05-02 NOTE — Telephone Encounter (Signed)
Requested medications are due for refill today yes  Requested medications are on the active medication list yes  Last refill 03/04/20  Notes to clinic Not Delegated

## 2020-05-30 ENCOUNTER — Other Ambulatory Visit: Payer: Self-pay | Admitting: Family Medicine

## 2020-05-30 DIAGNOSIS — I10 Essential (primary) hypertension: Secondary | ICD-10-CM

## 2020-06-02 ENCOUNTER — Other Ambulatory Visit: Payer: Self-pay

## 2020-06-02 ENCOUNTER — Telehealth (INDEPENDENT_AMBULATORY_CARE_PROVIDER_SITE_OTHER): Payer: BC Managed Care – PPO | Admitting: Family Medicine

## 2020-06-02 ENCOUNTER — Encounter: Payer: Self-pay | Admitting: Family Medicine

## 2020-06-02 VITALS — Ht 65.0 in | Wt 292.0 lb

## 2020-06-02 DIAGNOSIS — Z13220 Encounter for screening for lipoid disorders: Secondary | ICD-10-CM

## 2020-06-02 DIAGNOSIS — R7303 Prediabetes: Secondary | ICD-10-CM

## 2020-06-02 DIAGNOSIS — R609 Edema, unspecified: Secondary | ICD-10-CM

## 2020-06-02 DIAGNOSIS — J309 Allergic rhinitis, unspecified: Secondary | ICD-10-CM | POA: Diagnosis not present

## 2020-06-02 DIAGNOSIS — K219 Gastro-esophageal reflux disease without esophagitis: Secondary | ICD-10-CM

## 2020-06-02 DIAGNOSIS — R600 Localized edema: Secondary | ICD-10-CM

## 2020-06-02 DIAGNOSIS — I10 Essential (primary) hypertension: Secondary | ICD-10-CM

## 2020-06-02 MED ORDER — CETIRIZINE HCL 10 MG PO TABS: ORAL_TABLET | ORAL | 3 refills | Status: DC

## 2020-06-02 MED ORDER — FUROSEMIDE 40 MG PO TABS: 80.0000 mg | ORAL_TABLET | Freq: Two times a day (BID) | ORAL | 1 refills | Status: DC

## 2020-06-02 MED ORDER — PANTOPRAZOLE SODIUM 20 MG PO TBEC: DELAYED_RELEASE_TABLET | ORAL | 3 refills | Status: DC

## 2020-06-02 MED ORDER — POTASSIUM CHLORIDE CRYS ER 20 MEQ PO TBCR: 60.0000 meq | EXTENDED_RELEASE_TABLET | Freq: Two times a day (BID) | ORAL | 1 refills | Status: DC

## 2020-06-02 MED ORDER — OLMESARTAN MEDOXOMIL 40 MG PO TABS: ORAL_TABLET | ORAL | 1 refills | Status: DC

## 2020-06-02 NOTE — Progress Notes (Signed)
Virtual Visit via Video Note 6 min chart review.  Called 5:27 PM - he was driving - requested later call.   I connected with Philip Conley on 06/02/20 at 6:05 PM by a video enabled telemedicine application and verified that I am speaking with the correct person using two identifiers.  Patient location:home My location: office    I discussed the limitations, risks, security and privacy concerns of performing an evaluation and management service by telephone and the availability of in person appointments. I also discussed with the patient that there may be a patient responsible charge related to this service. The patient expressed understanding and agreed to proceed, consent obtained  Chief complaint:  Chief Complaint  Patient presents with  . Medication Refill    Pt is requesting refills on pended medication. Pt is planing to follow the provider to new location.    History of Present Illness: Philip Conley is a 50 y.o. male  Hypertension: With Peripheral edema, suspected component of diastolic CHF.  Swelling was improved on higher dose of furosemide 80 mg twice daily when last discussed in May 2021.  Was using compression stockings at that time along with potassium supplement. Cardiology appointment reviewed Jul 24, 2019.  Echo 08/15/19 without apparent cause,EF 60-65%, thought to be chronic venous insufficiency. Continued on Lasix 80 mg in the morning, 40 mg in the p.m, with olmesartan 40 mg daily, carvedilol 6.25mg  twice daily. Swelling up and down. Taking furosemide 80mg  BID and KCL 60mg  BID. Wearing compression stockings most days.  Constitutional: Negative for fatigue and unexpected weight change.  Eyes: Negative for visual disturbance.  Respiratory: Negative for cough, chest tightness and shortness of breath.   Cardiovascular: Negative for chest pain, palpitations and no new leg swelling.  Gastrointestinal: Negative for abdominal pain and blood in stool.  Neurological: Negative for  dizziness, light-headedness and headaches (only with allergies)  Home readings: 127/91 few weeks ago. Usually in 21'Y diastolic.  BP Readings from Last 3 Encounters:  11/16/19 118/68  08/15/19 (!) 150/93  07/24/19 136/88   Lab Results  Component Value Date   CREATININE 1.08 07/06/2019   GERD: protonix 20mg  qd. Has been controlling symptoms. Tolerating this dose. No breakthrough heartburn. No PUD. Option of intermittent dosing.   Allergic Rhinitis: Zyrtec 10mg  qd. Did not tolerate steroid ns - caused nasal irritation. Tried singulair in past - min benefit.  Will use tylenol or advil allergy sinus.   Prediabetes: Weight down further over the winter. avoiding fast food, sugar containing beverages.  Lab Results  Component Value Date   HGBA1C 5.8 (H) 07/06/2019   Wt Readings from Last 3 Encounters:  06/02/20 292 lb (132.5 kg)  11/16/19 299 lb (135.6 kg)  07/24/19 295 lb 3.2 oz (133.9 kg)    Hyperlipidemia: No FH of heart disease. No current meds.  The 10-year ASCVD risk score Mikey Bussing DC Brooke Bonito., et al., 2013) is: 4.8%   Values used to calculate the score:     Age: 88 years     Sex: Male     Is Non-Hispanic African American: No     Diabetic: No     Tobacco smoker: No     Systolic Blood Pressure: 248 mmHg     Is BP treated: Yes     HDL Cholesterol: 36 mg/dL     Total Cholesterol: 182 mg/dL  Lab Results  Component Value Date   CHOL 182 07/06/2019   HDL 36 (L) 07/06/2019   LDLCALC 123 (H) 07/06/2019  TRIG 126 07/06/2019   CHOLHDL 5.1 (H) 07/06/2019   Lab Results  Component Value Date   ALT 37 07/06/2019   AST 25 07/06/2019   ALKPHOS 103 07/06/2019   BILITOT 0.4 07/06/2019      Patient Active Problem List   Diagnosis Date Noted  . BMI 45.0-49.9, adult ([place]) 03/16/2019  . Hx of adenomatous and sessile serrated colonic polyps 02/18/2019  . Chronic eczematous otitis externa of both ears 01/27/2018  . Recurrent epistaxis 01/27/2018  . Snoring 01/27/2018  .  Circadian rhythm sleep disorder, shift work type 01/11/2018  . Subacute ethmoidal sinusitis 01/11/2018  . OSA (obstructive sleep apnea) 01/11/2018  . Class 3 drug-induced obesity with serious comorbidity and body mass index (BMI) of 45.0 to 49.9 in adult ([place]) 01/11/2018  . Edema of both ankles 01/11/2018  . Pedal edema 11/02/2017  . Venous (peripheral) insufficiency 11/02/2017  . Prediabetes 11/02/2017  . Carpal tunnel syndrome of right wrist 03/28/2017  . Hyperlipidemia with target low density lipoprotein (LDL) cholesterol less than 130 mg/dL 03/31/2012  . Migraine headache 03/31/2012  . Medication monitoring encounter 03/31/2012  . Insomnia 02/25/2012  . Hypertension 02/25/2012   Past Medical History:  Diagnosis Date  . Allergy   . Hx of adenomatous and sessile serrated colonic polyps 02/18/2019  . Hypertension    Past Surgical History:  Procedure Laterality Date  . HAND SURGERY     Growth removal    Allergies  Allergen Reactions  . Banana Other (See Comments)    Constriction in mouth/throat. tongue swelling.    Prior to Admission medications   Medication Sig Start Date End Date Taking? Authorizing Provider  Aspirin-Acetaminophen-Caffeine (EXCEDRIN MIGRAINE PO) Take by mouth as needed. Reported on 07/05/2015   Yes [provider]  carvedilol (COREG) 6.25 MG tablet TAKE 1 TABLET(6.25 MG) BY MOUTH TWICE DAILY WITH A MEAL 05/30/20  Yes Wendie Agreste, MD  cetirizine (ZYRTEC) 10 MG tablet TAKE 1 TABLET(10 MG) BY MOUTH AT BEDTIME 02/04/20  Yes Wendie Agreste, MD  cyclobenzaprine (FLEXERIL) 5 MG tablet TAKE 1 TABLET(5 MG) BY MOUTH THREE TIMES DAILY AS NEEDED FOR MUSCLE SPASMS. START EVERY NIGHT AT BEDTIME AS NEEDED DUE TO SEDATION 05/05/20  Yes Wendie Agreste, MD  furosemide (LASIX) 40 MG tablet Take 2 tablets (80 mg total) by mouth 2 (two) times daily. 11/16/19  Yes Lendon Colonel, NP  olmesartan (BENICAR) 40 MG tablet TAKE 1 TABLET(40 MG) BY MOUTH DAILY 02/04/20   Yes Wendie Agreste, MD  pantoprazole (PROTONIX) 20 MG tablet TAKE 1 TABLET(20 MG) BY MOUTH DAILY BEFORE SUPPER 05/02/20  Yes Wendie Agreste, MD  potassium chloride SA (KLOR-CON) 20 MEQ tablet Take 3 tablets (60 mEq total) by mouth 2 (two) times daily. 11/16/19  Yes Lendon Colonel, NP   Social History   Socioeconomic History  . Marital status: Married    Spouse name: Not on file  . Number of children: 3  . Years of education: Not on file  . Highest education level: Not on file  Occupational History  . Not on file  Tobacco Use  . Smoking status: Never Smoker  . Smokeless tobacco: Never Used  Vaping Use  . Vaping Use: Never used  Substance and Sexual Activity  . Alcohol use: Not Currently    Comment: occ  . Drug use: Never  . Sexual activity: Yes    Birth control/protection: None    Comment: number of sex partners in the last 29 months  1  Other Topics Concern  . Not on file  Social History Narrative  . Not on file   Social Determinants of Health   Financial Resource Strain: Not on file  Food Insecurity: Not on file  Transportation Needs: Not on file  Physical Activity: Not on file  Stress: Not on file  Social Connections: Not on file  Intimate Partner Violence: Not on file    Observations/Objective: Vitals:   06/02/20 1311  Weight: 292 lb (132.5 kg)  Height: 5\' 5"  (1.651 m)  Full sentences, no distress, no respiratory distress, euthymic mood.  Nontoxic appearance on video.  All questions were answered with understanding of plan expressed   Assessment and Plan: Allergic rhinitis, unspecified seasonality, unspecified trigger  Essential hypertension  Gastroesophageal reflux disease  Peripheral edema  Suspect chronic venous insufficiency as a cause of peripheral edema.  Up-and-down control but has been compliant with furosemide, compression stockings.  RTC precautions if worsening.  Continue same antihypertensives for now with reported home readings  overall stable.  Lab ordered.  Intermittent dosing of PPI is an option, refilled.  Avoidance measures discussed for allergies, continue cetirizine, option of trial of steroid nasal spray but that has caused irritation in the past.  RTC precautions.  Slight elevated LDL previously but ASCVD risk score was not at level to indicate need for statin.  Will repeat labs, including prediabetes testing.  Continue to avoid fast food, sugar-containing beverages.  Weight has improved.  57-month in-person follow-up planned.   Follow Up Instructions: As above   I discussed the assessment and treatment plan with the patient. The patient was provided an opportunity to ask questions and all were answered. The patient agreed with the plan and demonstrated an understanding of the instructions.   The patient was advised to call back or seek an in-person evaluation if the symptoms worsen or if the condition fails to improve as anticipated.  I provided 22 minutes of non-face-to-face time during this encounter.   Wendie Agreste, MD

## 2020-06-02 NOTE — Patient Instructions (Signed)
[facility] [place] [facility] Address: [address], [place],  [address] Phone: [phone]    If you have lab work done today you will be contacted with your lab results within the next 2 weeks.  If you have not heard from us then please contact us. The fastest way to get your results is to register for My Chart.   IF you received an x-ray today, you will receive an invoice from [facility]. Please contact [facility] at [phone] with questions or concerns regarding your invoice.   IF you received labwork today, you will receive an invoice from [facility]. Please contact [facility] at [phone] with questions or concerns regarding your invoice.   Our billing staff will not be able to assist you with questions regarding bills from these companies.  You will be contacted with the lab results as soon as they are available. The fastest way to get your results is to activate your My Chart account. Instructions are located on the last page of this paperwork. If you have not heard from us regarding the results in 2 weeks, please contact this office.     

## 2020-06-03 ENCOUNTER — Telehealth: Payer: Self-pay | Admitting: Family Medicine

## 2020-06-03 NOTE — Telephone Encounter (Signed)
06/03/2020 - PATIENT HAD A MY-CHART VIDEO WITH DR. Carlota Raspberry ON Monday (06/02/2020). DR. Carlota Raspberry HAS REQUESTED HE HAVE A 6 MONTH FOLLOW-UP WITH HIM IN THE OFFICE FOR MEDICATION REVIEW. I TRIED TO CALL TO BE SURE HE IS AWARE OF DR. GREENE'S NEW OFFICE AT [facility] IN [place] BUT I HAD TO LEAVE A MESSAGE. I DID GIVE HIM THE INFORMATION AND TOLD HIM TO CALL us BACK IF HE HAD ANY OTHER QUESTIONS. [place]

## 2020-08-15 ENCOUNTER — Other Ambulatory Visit: Payer: Self-pay | Admitting: Family Medicine

## 2020-08-15 DIAGNOSIS — I10 Essential (primary) hypertension: Secondary | ICD-10-CM

## 2020-08-19 ENCOUNTER — Other Ambulatory Visit: Payer: Self-pay | Admitting: Family Medicine

## 2020-08-19 DIAGNOSIS — I10 Essential (primary) hypertension: Secondary | ICD-10-CM

## 2020-09-04 ENCOUNTER — Other Ambulatory Visit: Payer: Self-pay | Admitting: Family Medicine

## 2020-09-04 DIAGNOSIS — R609 Edema, unspecified: Secondary | ICD-10-CM

## 2020-09-04 DIAGNOSIS — M542 Cervicalgia: Secondary | ICD-10-CM

## 2020-09-04 NOTE — Telephone Encounter (Signed)
LFD 05/05/20 #15 with 1 refill LOV 06/02/20 NOV none

## 2020-10-27 ENCOUNTER — Other Ambulatory Visit: Payer: Self-pay | Admitting: Family Medicine

## 2020-10-27 DIAGNOSIS — I10 Essential (primary) hypertension: Secondary | ICD-10-CM

## 2020-11-23 ENCOUNTER — Other Ambulatory Visit: Payer: Self-pay | Admitting: Family Medicine

## 2020-11-23 DIAGNOSIS — R609 Edema, unspecified: Secondary | ICD-10-CM

## 2020-11-23 DIAGNOSIS — M542 Cervicalgia: Secondary | ICD-10-CM

## 2020-11-26 ENCOUNTER — Ambulatory Visit (INDEPENDENT_AMBULATORY_CARE_PROVIDER_SITE_OTHER): Payer: BC Managed Care – PPO | Admitting: Cardiology

## 2020-11-26 ENCOUNTER — Other Ambulatory Visit: Payer: Self-pay | Admitting: Family Medicine

## 2020-11-26 ENCOUNTER — Other Ambulatory Visit: Payer: Self-pay

## 2020-11-26 ENCOUNTER — Encounter (HOSPITAL_BASED_OUTPATIENT_CLINIC_OR_DEPARTMENT_OTHER): Payer: Self-pay | Admitting: Cardiology

## 2020-11-26 DIAGNOSIS — R609 Edema, unspecified: Secondary | ICD-10-CM

## 2020-11-26 DIAGNOSIS — I878 Other specified disorders of veins: Secondary | ICD-10-CM

## 2020-11-26 DIAGNOSIS — I10 Essential (primary) hypertension: Secondary | ICD-10-CM

## 2020-11-26 DIAGNOSIS — R600 Localized edema: Secondary | ICD-10-CM

## 2020-11-26 DIAGNOSIS — Z7189 Other specified counseling: Secondary | ICD-10-CM

## 2020-11-26 NOTE — Progress Notes (Signed)
Cardiology Office Note:    Date:  11/26/2020   ID:  Philip Conley, DOB 11-08-1970, MRN [id number]  PCP:  Wendie Agreste, MD  Cardiologist:  Buford Dresser, MD  Referring MD: Wendie Agreste, MD   CC: follow-up for the evaluation and management of hypertension and edema.  History of Present Illness:    Philip Conley is a 50 y.o. male with a hx of hypertension, LE edema who is seen for follow-up. I initially met him 07/24/2019 as a new consult at the request of Wendie Agreste, MD for the evaluation and management of hypertension with edema.  Today: His main concerns today are his bilateral LE edema and multiple skin tags appearing on his upper chest and neck. In clinic today, he notes the size of his legs is an improvement from baseline. On a bad day his legs are 3x this size down to the ankle. Recently he is taking more advil and tylenol due to knee pain.  For his diet, he has had some success with cutting back on certain foods. He continues to work on this. When he cooks meals, he does not add extra salt. Most of the seasonings and food is low-sodium or reduced fat. Normally he doubles the dosage of his diuretic on nights that his family orders out. During the summer he eats less but may snack on cheese. He does not drink coffee but may drink up to 2 sodas in a day.  At home he checks his blood pressure about once a week, and typically ranges from 128-140/84-92.  He denies any palpitations, chest pain, or shortness of breath. No lightheadedness, headaches, syncope, orthopnea, or PND. Also has no exertional symptoms.  Past Medical History:  Diagnosis Date   Allergy    Hx of adenomatous and sessile serrated colonic polyps 02/18/2019   Hypertension     Past Surgical History:  Procedure Laterality Date   HAND SURGERY     Growth removal     Current Medications: Current Outpatient Medications on File Prior to Visit  Medication Sig   Aspirin-Acetaminophen-Caffeine  (EXCEDRIN MIGRAINE PO) Take by mouth as needed. Reported on 07/05/2015   cetirizine (ZYRTEC) 10 MG tablet TAKE 1 TABLET(10 MG) BY MOUTH AT BEDTIME   cyclobenzaprine (FLEXERIL) 5 MG tablet TAKE 1 TABLET(5 MG) BY MOUTH THREE TIMES DAILY AS NEEDED FOR MUSCLE SPASMS   olmesartan (BENICAR) 40 MG tablet TAKE 1 TABLET(40 MG) BY MOUTH DAILY   pantoprazole (PROTONIX) 20 MG tablet TAKE 1 TABLET(20 MG) BY MOUTH DAILY BEFORE SUPPER   potassium chloride SA (KLOR-CON) 20 MEQ tablet Take 3 tablets (60 mEq total) by mouth 2 (two) times daily.   Current Facility-Administered Medications on File Prior to Visit  Medication   0.9 %  sodium chloride infusion     Allergies:   Banana   Social History   Tobacco Use   Smoking status: Never   Smokeless tobacco: Never  Vaping Use   Vaping Use: Never used  Substance Use Topics   Alcohol use: Not Currently    Comment: occ   Drug use: Never    Family History: family history includes Heart disease in his maternal grandmother, mother, and paternal grandmother; Hypertension in his mother; Lung disease in his maternal grandfather; Lupus in his father; Stroke in his father and paternal grandfather. There is no history of Colon polyps, Esophageal cancer, Rectal cancer, or Stomach cancer.  ROS:   Please see the history of present illness. (+) Bilateral LE edema (+)  Bilateral knee pain (+) Skin tags All other systems are reviewed and negative.    EKGs/Labs/Other Studies Reviewed:    The following studies were reviewed today:  Echo 08/15/2019:  1. Left ventricular ejection fraction, by estimation, is 60 to 65%. The  left ventricle has normal function. The left ventricle has no regional  wall motion abnormalities. Left ventricular diastolic parameters were  normal.   2. Right ventricular systolic function is normal. The right ventricular  size is normal. There is normal pulmonary artery systolic pressure.   3. The mitral valve is normal in structure. No  evidence of mitral valve  regurgitation. No evidence of mitral stenosis.   4. The aortic valve is normal in structure. Aortic valve regurgitation is  not visualized. No aortic stenosis is present.   5. The inferior vena cava is normal in size with greater than 50%  respiratory variability, suggesting right atrial pressure of 3 mmHg.  LE Venous Reflux 01/10/2018: Summary:  Right: No reflux was noted in the femoral vein in the thigh, popliteal  vein, great saphenous vein at the saphenofemoral junction, great saphenous vein at the proximal thigh, great saphenous vein at the mid thigh, great saphenous vein at the distal thigh,  great saphenous vein at the knee, origin of the small saphenous vein, proximal small saphenous vein, and mid small saphenous vein. Abnormal reflux times were noted in the common femoral vein. Evidence of chronic venous insufficiency is detected in the deep venous system. There is no evidence of deep vein thrombosis in the lower extremity. There is no evidence of superficial venous thrombosis. No cystic structure found in the popliteal fossa.  Left: No reflux was noted in the femoral vein in the thigh, popliteal  vein, great saphenous vein at the saphenofemoral junction, great saphenous vein at the proximal thigh, great saphenous vein at the mid thigh, great saphenous vein at the distal thigh,  great saphenous vein at the knee, origin of the small saphenous vein, proximal small saphenous vein, and mid small saphenous vein. Abnormal reflux times were noted in the common femoral vein. Evidence of chronic venous insufficiency is detected in the  deep venous system. There is no evidence of deep vein thrombosis in the lower extremity. There is no evidence of superficial venous thrombosis. No cystic structure found in the popliteal fossa.  EKG:  EKG is personally reviewed.   11/26/2020: NSR at 84 bpm 07/24/2019: NSR at 77 bpm  Recent Labs: No results found for requested labs within  last 8760 hours.  Recent Lipid Panel    Component Value Date/Time   CHOL 182 07/06/2019 1039   TRIG 126 07/06/2019 1039   HDL 36 (L) 07/06/2019 1039   CHOLHDL 5.1 (H) 07/06/2019 1039   CHOLHDL 5.7 01/25/2012 1107   VLDL 26 01/25/2012 1107   LDLCALC 123 (H) 07/06/2019 1039    Physical Exam:    VS:  BP (!) 160/98 (BP Location: Right Arm, Patient Position: Sitting)   Pulse 84   Ht '5\' 6"'  (1.676 m)   Wt 300 lb (136.1 kg)   BMI 48.42 kg/m     Wt Readings from Last 3 Encounters:  11/26/20 300 lb (136.1 kg)  06/02/20 292 lb (132.5 kg)  11/16/19 299 lb (135.6 kg)    GEN: Well nourished, well developed in no acute distress HEENT: Normal, moist mucous membranes NECK: No JVD CARDIAC: regular rhythm, normal S1 and S2, no rubs or gallops. No murmurs. VASCULAR: Radial and DP pulses 2+ bilaterally. No carotid  bruits RESPIRATORY:  Clear to auscultation without rales, wheezing or rhonchi  ABDOMEN: Soft, non-tender, non-distended MUSCULOSKELETAL:  Ambulates independently SKIN: Warm and dry, with brawny bilateral LE edema and skin discoloration NEUROLOGIC:  Alert and oriented x 3. No focal neuro deficits noted. PSYCHIATRIC:  Normal affect    ASSESSMENT:    1. Morbid obesity ([place])   2. Peripheral edema   3. Essential hypertension   4. Chronic venous stasis   5. Cardiac risk counseling   6. Counseling on health promotion and disease prevention     PLAN:    Lower extremity edema -echo unremarkable -chronic venous stasis changes -reviewed salt avoidance, compression, elevation.  -he drinks 15-20 bottles of water daily. Reviewed balance for hydration and fluid retention  Hypertension:  -BP over goal today, increased on recheck -would like to start spironolactone given his need for chronic potassium supplementation and LE edema. However, he does not have recent labs. Will check this first -instructed on lifestyle changes today, including weight loss -for now will continue  carvedilol 6.25 mg BID, lasix 80 mg in the AM/40 mg in the PM, and olmesartan 40 mg daily  Morbid obesity, BMI 48 -we discussed that his weight is exacerbating his fluid retention and hypertension. Would benefit from weight loss. Discussed GLP1RA today, he is interested. Will see if this is affordable for him. -has history of prediabetes/abnormal glucose tolerance. Will check A1c  Cardiac risk counseling and prevention recommendations: -recommend heart healthy/Mediterranean diet, with whole grains, fruits, vegetable, fish, lean meats, nuts, and olive oil. Limit salt. -recommend moderate walking, 3-5 times/week for 30-50 minutes each session. Aim for at least 150 minutes.week. Goal should be Conley of 3 miles/hours, or walking 1.5 miles in 30 minutes -recommend avoidance of tobacco products. Avoid excess alcohol. -Additional risk factor control:  -Lipids: per [facility], lipids 07/06/19 show Tchol 182, HDL 36, TG 126. LDL not noted -ASCVD risk score: The 10-year ASCVD risk score (Arnett DK, et al., 2019) is: 8.9%   Values used to calculate the score:     Age: 15 years     Sex: Male     Is Non-Hispanic African American: No     Diabetic: No     Tobacco smoker: No     Systolic Blood Pressure: 637 mmHg     Is BP treated: Yes     HDL Cholesterol: 36 mg/dL     Total Cholesterol: 182 mg/dL    Plan for follow up: 3 mos or sooner as needed.  Buford Dresser, MD, PhD [place]  [facility]    Medication Adjustments/Labs and Tests Ordered: Current medicines are reviewed at length with the patient today.  Concerns regarding medicines are outlined above.   Orders Placed This Encounter  Procedures   Comprehensive metabolic panel   Lipid panel   Hemoglobin A1c   EKG 12-Lead    No orders of the defined types were placed in this encounter.   Patient Instructions  Medication Instructions:  Your physician recommends that you continue on your current medications as directed. Please refer  to the Current Medication list given to you today.  *If you need a refill on your cardiac medications before your next appointment, please call your pharmacy*   Lab Work: Please return for FASTING labs (CMET, Lipid, A1C)  Your provider has recommended lab work. Please have this collected at [facility] [place] or any [facility]. The lab is open 8:00 am - 4:30 pm. Please avoid 12:00p - 1:00p for lunch  hour. You do not need an appointment. Please go to [address] [place], [place] [address]. This is in the Primary Care office on the 3rd floor, let them know you are there for blood work and they will direct you to the lab.  Follow-Up: At [facility], you and your health needs are our priority.  As part of our continuing mission to provide you with exceptional heart care, we have created designated Provider Care Teams.  These Care Teams include your primary Cardiologist (physician) and Advanced Practice Providers (APPs -  Physician Assistants and Nurse Practitioners) who all work together to provide you with the care you need, when you need it.  We recommend signing up for the patient portal called "MyChart".  Sign up information is provided on this After Visit Summary.  MyChart is used to connect with patients for Virtual Visits (Telemedicine).  Patients are able to view lab/test results, encounter notes, upcoming appointments, etc.  Non-urgent messages can be sent to your provider as well.   To learn more about what you can do with MyChart, go to [email or web address].    Your next appointment:   3 month(s)  The format for your next appointment:   In Person  Provider:   Buford Dresser, MD    [facility],acting as a scribe for Buford Dresser, MD.,have documented all relevant documentation on the behalf of Buford Dresser, MD,as directed by  Buford Dresser, MD while in the presence of Buford Dresser, MD.  I, Buford Dresser, MD, have reviewed all documentation for this visit. The documentation on 11/26/20 for the exam, diagnosis, procedures, and orders are all accurate and complete.   Signed, Buford Dresser, MD PhD 11/26/2020  [place]

## 2020-11-26 NOTE — Patient Instructions (Signed)
Medication Instructions:  Your physician recommends that you continue on your current medications as directed. Please refer to the Current Medication list given to you today.  *If you need a refill on your cardiac medications before your next appointment, please call your pharmacy*   Lab Work: Please return for FASTING labs (CMET, Lipid, A1C)  Your provider has recommended lab work. Please have this collected at [facility] [place] or any [facility]. The lab is open 8:00 am - 4:30 pm. Please avoid 12:00p - 1:00p for lunch hour. You do not need an appointment. Please go to [address] [place], [place] [address]. This is in the Primary Care office on the 3rd floor, let them know you are there for blood work and they will direct you to the lab.  Follow-Up: At [facility], you and your health needs are our priority.  As part of our continuing mission to provide you with exceptional heart care, we have created designated Provider Care Teams.  These Care Teams include your primary Cardiologist (physician) and Advanced Practice Providers (APPs -  Physician Assistants and Nurse Practitioners) who all work together to provide you with the care you need, when you need it.  We recommend signing up for the patient portal called "MyChart".  Sign up information is provided on this After Visit Summary.  MyChart is used to connect with patients for Virtual Visits (Telemedicine).  Patients are able to view lab/test results, encounter notes, upcoming appointments, etc.  Non-urgent messages can be sent to your provider as well.   To learn more about what you can do with MyChart, go to [email or web address].    Your next appointment:   3 month(s)  The format for your next appointment:   In Person  Provider:   Buford Dresser, MD

## 2020-11-28 DIAGNOSIS — R609 Edema, unspecified: Secondary | ICD-10-CM | POA: Diagnosis not present

## 2020-11-28 DIAGNOSIS — I10 Essential (primary) hypertension: Secondary | ICD-10-CM | POA: Diagnosis not present

## 2020-11-28 LAB — COMPREHENSIVE METABOLIC PANEL
ALT: 65 IU/L — ABNORMAL HIGH (ref 0–44)
AST: 57 IU/L — ABNORMAL HIGH (ref 0–40)
Albumin/Globulin Ratio: 2 (ref 1.2–2.2)
Albumin: 4.4 g/dL (ref 3.8–4.9)
Alkaline Phosphatase: 105 IU/L (ref 44–121)
BUN/Creatinine Ratio: 13 (ref 9–20)
BUN: 12 mg/dL (ref 6–24)
Bilirubin Total: 0.4 mg/dL (ref 0.0–1.2)
CO2: 27 mmol/L (ref 20–29)
Calcium: 9.5 mg/dL (ref 8.7–10.2)
Chloride: 100 mmol/L (ref 96–106)
Creatinine, Ser: 0.91 mg/dL (ref 0.76–1.27)
Globulin, Total: 2.2 g/dL (ref 1.5–4.5)
Glucose: 117 mg/dL — ABNORMAL HIGH (ref 65–99)
Potassium: 4.5 mmol/L (ref 3.5–5.2)
Sodium: 141 mmol/L (ref 134–144)
Total Protein: 6.6 g/dL (ref 6.0–8.5)
eGFR: 101 mL/min/{1.73_m2} (ref >59)

## 2020-11-28 LAB — HEMOGLOBIN A1C
Est. average glucose Bld gHb Est-mCnc: 154 mg/dL
Hgb A1c MFr Bld: 7 % — ABNORMAL HIGH (ref 4.8–5.6)

## 2020-11-28 LAB — LIPID PANEL
Chol/HDL Ratio: 5.6 ratio — ABNORMAL HIGH (ref 0.0–5.0)
Cholesterol, Total: 184 mg/dL (ref 100–199)
HDL: 33 mg/dL — ABNORMAL LOW (ref >39)
LDL Chol Calc (NIH): 122 mg/dL — ABNORMAL HIGH (ref 0–99)
Triglycerides: 159 mg/dL — ABNORMAL HIGH (ref 0–149)
VLDL Cholesterol Cal: 29 mg/dL (ref 5–40)

## 2020-12-02 ENCOUNTER — Telehealth: Payer: Self-pay | Admitting: Student-PharmD

## 2020-12-02 NOTE — Telephone Encounter (Signed)
Received referral from Dr. Harrell Gave to look into coverage for GLP1 agonist for weight loss. Called patient to confirm interest in starting injectable treatment for weight loss. Discussed that with [facility] being on shortage, we have been starting patients on Saxenda and titrating to the max dose of 3 mg then switching to [facility] 1.7 mg and titrating from there. He is interested in starting treatment and is fine with a daily injection for the first 6 weeks before he can switch to the weekly injection. Confirmed he does not have any contraindications for GLP1 agonist treatment including personal or family history of medullary thyroid carcinoma.   Have submitted a PA for Saxenda and will update him regarding the status. If weight loss medications are not covered by his plan, will try to get Ozempic or Victoza approved given A1c from 9/16 of 7.0 is in the diabetic range.

## 2020-12-03 ENCOUNTER — Other Ambulatory Visit (HOSPITAL_BASED_OUTPATIENT_CLINIC_OR_DEPARTMENT_OTHER): Payer: Self-pay

## 2020-12-03 DIAGNOSIS — Z79899 Other long term (current) drug therapy: Secondary | ICD-10-CM

## 2020-12-03 MED ORDER — SPIRONOLACTONE 25 MG PO TABS: 25.0000 mg | ORAL_TABLET | Freq: Every day | ORAL | 3 refills | Status: DC

## 2020-12-09 MED ORDER — SAXENDA 18 MG/3ML ~~LOC~~ SOPN: PEN_INJECTOR | SUBCUTANEOUS | 1 refills | Status: DC

## 2020-12-09 MED ORDER — PEN NEEDLES 32G X 4 MM MISC: 1 refills | Status: DC

## 2020-12-09 NOTE — Telephone Encounter (Signed)
PA for Philip Conley has been approved through 04/06/21. This along with pen needles have been sent to his pharmacy. Confirmed cost of Philip Conley is $24.99 so he will not need to use a copay card.   Called patient to let him know this and to schedule him for office visit with pharmacist for first injection training. Unable to reach, LVM requesting call back.

## 2020-12-09 NOTE — Telephone Encounter (Signed)
Pt returned call, he is aware to pick up Alcolu and pen needles from the pharmacy and store the med in the [place]. He will bring dose to PharmD appt scheduled on 10/11 at [facility] location, address provided to pt as he typically is seen in other [place] offices.

## 2020-12-22 NOTE — Progress Notes (Signed)
Patient ID: Philip Conley                 DOB: 1970-08-12                    MRN: [id number]    HPI: Philip Conley is a 50 y.o. male patient referred to pharmacy clinic by Dr. Harrell Gave to initiate weight loss therapy with GLP1-RA. PMH is significant for obesity complicated by chronic medical conditions including HTN, HLD, newly diagnosed DM. Most recent BMI 48.44, 49.52 today. Given We[employer] and [employer], plan is to start on Saxenda then switch to [facility] once titrated up. Saxenda PA approved through 04/06/21, cost is $24.99.   Patient presents today for first Saxenda injection. He has already had his baseline A1c and lipid panel which showed elevated A1c of 7.0 in diabetic range and LDL elevated to 122 (has upcoming appt with Dr. Harrell Gave to start a statin). Triglycerides also elevated to 159. At his last visit with Dr. Harrell Gave, he was started on spironolactone for BP 160/98 which he takes at night. BP in office today is much improved to 124/76. He states that it has helped drastically with his lower extremity edema to where he is only taking furosemide in the morning rather than morning and evening. Notes that he thinks his home BP machine is old and not accurate, showing readings of 150/120 one minute and 134/76 the next. Dr. Harrell Gave has ordered a BMET to assess renal function and electrolytes which he will have drawn in our office today.   Current weight management medications: none  Previously tried meds: none  Current meds that may affect weight: none  Baseline weight/BMI: 139.2 kg (BMI 49.52)  Insurance payor: BCBS/commercial  Diet:  -Breakfast: crackers and cheese, gets to work at [employer] so it's light -Lunch: 11am, leftovers from dinner, sometimes picks up fast food -Dinner: grilled meat, fish, chicken; potatoes/starch, vegetables; plate is 1/3 each  -Snacks: occasional handful of chips, crackers, pretzels, no sweets, biggest downfall is nuts -Drinks: Cope per  day for caffeine, 12 oz orange juice in the morning, water for the rest of the day (64 oz)  Exercise: active walking dogs, in the yard  Family History: Heart disease in his maternal grandmother, mother, and paternal grandmother; Hypertension in his mother; Lung disease in his maternal grandfather; Lupus in his father; Stroke in his father and paternal grandfather.  Social History: Never smoker  Labs: Lab Results  Component Value Date   HGBA1C 7.0 (H) 11/28/2020    Wt Readings from Last 1 Encounters:  11/26/20 300 lb (136.1 kg)    BP Readings from Last 1 Encounters:  11/26/20 (!) 160/98   Pulse Readings from Last 1 Encounters:  11/26/20 84       Component Value Date/Time   CHOL 184 11/28/2020 0707   TRIG 159 (H) 11/28/2020 0707   HDL 33 (L) 11/28/2020 0707   CHOLHDL 5.6 (H) 11/28/2020 0707   CHOLHDL 5.7 01/25/2012 1107   VLDL 26 01/25/2012 1107   LDLCALC 122 (H) 11/28/2020 0707    Past Medical History:  Diagnosis Date   Allergy    Hx of adenomatous and sessile serrated colonic polyps 02/18/2019   Hypertension     Current Outpatient Medications on File Prior to Visit  Medication Sig Dispense Refill   Aspirin-Acetaminophen-Caffeine (EXCEDRIN MIGRAINE PO) Take by mouth as needed. Reported on 07/05/2015     carvedilol (COREG) 6.25 MG tablet TAKE 1 TABLET(6.25 MG) BY MOUTH TWICE  DAILY WITH A MEAL 180 tablet 0   cetirizine (ZYRTEC) 10 MG tablet TAKE 1 TABLET(10 MG) BY MOUTH AT BEDTIME 90 tablet 3   cyclobenzaprine (FLEXERIL) 5 MG tablet TAKE 1 TABLET(5 MG) BY MOUTH THREE TIMES DAILY AS NEEDED FOR MUSCLE SPASMS 15 tablet 0   furosemide (LASIX) 40 MG tablet TAKE 2 TABLETS(80 MG) BY MOUTH TWICE DAILY 360 tablet 1   Insulin Pen Needle (PEN NEEDLES) 32G X 4 MM MISC Use once daily to inject Saxenda. 100 each 1   Liraglutide -Weight Management (SAXENDA) 18 MG/3ML SOPN Inject 0.32m into the abdomen daily x7 days, then increase to 1.232mx7d, 1.28m6m7d, 2.4mg31md, then 3mg 19mly. 15  mL 1   olmesartan (BENICAR) 40 MG tablet TAKE 1 TABLET(40 MG) BY MOUTH DAILY 90 tablet 1   pantoprazole (PROTONIX) 20 MG tablet TAKE 1 TABLET(20 MG) BY MOUTH DAILY BEFORE SUPPER 90 tablet 3   potassium chloride SA (KLOR-CON) 20 MEQ tablet Take 3 tablets (60 mEq total) by mouth 2 (two) times daily. 540 tablet 1   spironolactone (ALDACTONE) 25 MG tablet Take 1 tablet (25 mg total) by mouth daily. 90 tablet 3   Current Facility-Administered Medications on File Prior to Visit  Medication Dose Route Frequency Provider Last Rate Last Admin   0.9 %  sodium chloride infusion  500 mL Intravenous Once GessnGatha Mayer       Allergies  Allergen Reactions   Banana Other (See Comments)    Constriction in mouth/throat. tongue swelling.      Assessment/Plan:  1. Weight loss - Patient has not met goal of at least 5% of body weight loss with comprehensive lifestyle modifications alone in the past 3-6 months. Pharmacotherapy is appropriate to pursue as augmentation. Will start [facility]en[facility]ico [facility] to higher doses that are [facility]. Confirmed patient has no personal or family history of medullary thyroid carcinoma (MTC) or Multiple Endocrine Neoplasia syndrome type 2 (MEN 2).   Advised patient on common side effects including nausea, diarrhea, dyspepsia, decreased appetite, and fatigue. Counseled patient on reducing meal size and how to titrate medication to minimize side effects. Counseled patient to call if intolerable side effects or if experiencing dehydration, abdominal pain, or dizziness. Patient will adhere to dietary modifications and will target at least 150 minutes of moderate intensity exercise weekly. Discussed decreasing soda and juice intake as well as fast/fried food to help his BG and weight.   GLP1 Agonist Titration Plan:  Will plan to follow the titration plan as below, pending patient is tolerating each  dose before increasing to the next. Can slow titration if needed for tolerability.   Saxenda to [employer] 1: Saxenda 0.6 mg daily x 7 days -Week 2: Saxenda 1.2 mg daily x 7 days -Week 3: Saxenda 1.8 mg daily x 7 days -Week 4: Saxenda 2.4 mg daily x 7 days -Week 5 & 6: Saxenda 3 mg daily x 14 days -Week 7-10: Stop Saxenda. Start Wegovy 1.7 mg weekly x 4 weeks -Week 11 & on: Wegovy 2.4 mg weekly   He administered his first dose in office today without issue. GLP1 agonist treatment will help lower his BG and A1c. It should also help lower his triglyercides. He has an appointment with Dr. ChrisHarrell Gaveovember to discuss starting a statin.   Follow up in 1 month by telephone. 3 months for office visit.   MadisRebbeca Paul  PharmD PGY2 Ambulatory Care Pharmacy Resident 12/22/2020 10:25 AM

## 2020-12-23 ENCOUNTER — Other Ambulatory Visit: Payer: Self-pay

## 2020-12-23 ENCOUNTER — Ambulatory Visit (INDEPENDENT_AMBULATORY_CARE_PROVIDER_SITE_OTHER): Payer: BC Managed Care – PPO | Admitting: Student-PharmD

## 2020-12-23 VITALS — BP 124/76 | HR 86 | Ht 66.0 in | Wt 306.8 lb

## 2020-12-23 DIAGNOSIS — I10 Essential (primary) hypertension: Secondary | ICD-10-CM | POA: Diagnosis not present

## 2020-12-23 DIAGNOSIS — Z6841 Body Mass Index (BMI) 40.0 and over, adult: Secondary | ICD-10-CM | POA: Diagnosis not present

## 2020-12-23 NOTE — Patient Instructions (Signed)
Saxenda Counseling Points This medication reduces your appetite and may make you feel fuller longer.  Stop eating when your body tells you that you are full. This will likely happen sooner than you are used to. Store your medication in the fridge until you are ready to use it. Inject your medication in the fatty tissue of your lower abdominal area (2 inches away from belly button) or upper outer thigh. Rotate injection sites. Each pen will last you about 1 month (the first month it will last a few weeks longer). Use a different needle with each weekly injection. Common side effects include: nausea, diarrhea/constipation, and heartburn, and are more likely to occur if you overeat.  Dosing schedule: Saxenda to [facility] -Week 1: Saxenda 0.6 mg daily x 7 days -Week 2: Saxenda 1.2 mg daily x 7 days -Week 3: Saxenda 1.8 mg daily x 7 days -Week 4: Saxenda 2.4 mg daily x 7 days -Week 5 & 6: Saxenda 3 mg daily x 14 days -Week 7-10: Stop Saxenda. Start Wegovy 1.7 mg weekly x 4 weeks -Week 11 & on: Wegovy 2.4 mg weekly   Tips for living a healthier life  SUGAR  Sugar is a huge problem in the modern day diet. Sugar is a big contributor to heart disease, diabetes, high triglyceride levels, fatty liver disease and obesity. Sugar is hidden in almost all packaged foods/beverages. Added sugar is extra sugar that is added beyond what is naturally found and has no nutritional benefit for your body. The American Heart Association recommends limiting added sugars to no more than 25g for women and 36 grams for men per day. There are many names for sugar including maltose, sucrose (names ending in "ose"), high fructose corn syrup, molasses, cane sugar, corn sweetener, raw sugar, syrup, honey or fruit juice concentrate.   One of the best ways to limit your added sugars is to stop drinking sweetened beverages such as soda, sweet tea, and fruit juice. There is 65g of added sugars in one 20oz bottle of Coke! That is  equal to 6 donuts.   Pay attention and read all nutrition facts labels. Below is an examples of a nutrition facts label. The #1 is showing you the total sugars where the # 2 is showing you the added sugars. This one serving has almost the max amount of added sugars per day!     EXERCISE  Exercise is good. We've all heard that. In an ideal world, we would all have time and resources to get plenty of it. When you are active, your heart pumps more efficiently and you will feel better.  Multiple studies show that even walking regularly has benefits that include living a longer life. The American Heart Association recommends 150 minutes per week of exercise (30 minutes per day most days of the week). You can do this in any increment you wish. Nine or more 10-minute walks count. So does an hour-long exercise class. Break the time apart into what will work in your life. Some of the best things you can do include walking briskly, jogging, cycling or swimming laps. Not everyone is ready to "exercise." Sometimes we need to start with just getting active. Here are some easy ways to be more active throughout the day:  Take the stairs instead of the elevator  Go for a 10-15 minute walk during your lunch break (find a friend to make it more enjoyable)  When shopping, park at the back of the parking lot  If you  take public transportation, get off one stop early and walk the extra distance  Pace around while making phone calls  Check with your doctor if you aren't sure what your limitations may be. Always remember to drink plenty of water when doing any type of exercise. Don't feel like a failure if you're not getting the 90-150 minutes per week. If you started by being a couch potato, then just a 10-minute walk each day is a huge improvement. Start with little victories and work your way up.   HEALTHY EATING TIPS  When looking to improve your eating habits, whether to lose weight, lower blood pressure or just  be healthier, it helps to know what a serving size is.   Grains 1 slice of bread,  bagel,  cup pasta or rice  Vegetables 1 cup fresh or raw vegetables,  cup cooked or canned Fruits 1 piece of medium sized fruit,  cup canned,   Meats/Proteins  cup dried       1 oz meat, 1 egg,  cup cooked beans, nuts or seeds  Dairy        Fats Individual yogurt container, 1 cup (8oz)    1 teaspoon margarine/butter or vegetable  milk or milk alternative, 1 slice of cheese          oil; 1 tablespoon mayonnaise or salad dressing                  Plan ahead: make a menu of the meals for a week then create a grocery list to go with that menu. Consider meals that easily stretch into a night of leftovers, such as stews or casseroles. Or consider making two of your favorite meal and put one in the freezer for another night. Try a night or two each week that is "meatless" or "no cook" such as salads. When you get home from the grocery store wash and prepare your vegetables and fruits. Then when you need them they are ready to go.   Tips for going to the grocery store:  [place] store or generic brands  Check the weekly ad from your store on-line or in their in-store flyer  Look at the unit price on the shelf tag to compare/contrast the costs of different items  Buy fruits/vegetables in season  Carrots, bananas and apples are low-cost, naturally healthy items  If meats or frozen vegetables are on sale, buy some extras and put in your freezer  Limit buying prepared or "ready to eat" items, even if they are pre-made salads or fruit snacks  Do not shop when you're hungry  Foods at eye level tend to be more expensive. Look on the high and low shelves for deals.  Consider shopping at the farmer's market for fresh foods in season.  Avoid the cookie and chip aisles (these are expensive, high in calories and low in nutritional value). Shop on the outside of the grocery store.  Healthy food preparations:  If you can't get  lean hamburger, be sure to drain the fat when cooking  Steam, saut (in olive oil), grill or bake foods  Experiment with different seasonings to avoid adding salt to your foods. Kosher salt, sea salt and Himalayan salt are all still salt and should be avoided. Try seasoning food with onion, garlic, thyme, rosemary, basil ect. Onion powder or garlic powder is ok. Avoid if it says salt (ie garlic salt).        Other resources: American Heart Association - InstantFinish.fi  Go to the Healthy Living tab to get more information American Diabetes Association - www.diabetes.org         You don't have to be diabetic - check out the Food and Fitness tab

## 2020-12-24 LAB — BASIC METABOLIC PANEL
BUN/Creatinine Ratio: 10 (ref 9–20)
BUN: 11 mg/dL (ref 6–24)
CO2: 25 mmol/L (ref 20–29)
Calcium: 9.7 mg/dL (ref 8.7–10.2)
Chloride: 99 mmol/L (ref 96–106)
Creatinine, Ser: 1.06 mg/dL (ref 0.76–1.27)
Glucose: 179 mg/dL — ABNORMAL HIGH (ref 70–99)
Potassium: 4.5 mmol/L (ref 3.5–5.2)
Sodium: 138 mmol/L (ref 134–144)
eGFR: 84 mL/min/{1.73_m2} (ref >59)

## 2021-01-26 ENCOUNTER — Other Ambulatory Visit: Payer: Self-pay | Admitting: Family Medicine

## 2021-01-26 DIAGNOSIS — R609 Edema, unspecified: Secondary | ICD-10-CM

## 2021-01-26 DIAGNOSIS — I10 Essential (primary) hypertension: Secondary | ICD-10-CM

## 2021-01-27 ENCOUNTER — Other Ambulatory Visit: Payer: Self-pay

## 2021-01-27 ENCOUNTER — Ambulatory Visit (INDEPENDENT_AMBULATORY_CARE_PROVIDER_SITE_OTHER): Payer: BC Managed Care – PPO | Admitting: Cardiology

## 2021-01-27 ENCOUNTER — Encounter (HOSPITAL_BASED_OUTPATIENT_CLINIC_OR_DEPARTMENT_OTHER): Payer: Self-pay | Admitting: Cardiology

## 2021-01-27 VITALS — BP 102/72 | HR 93 | Ht 66.0 in | Wt 297.8 lb

## 2021-01-27 DIAGNOSIS — Z79899 Other long term (current) drug therapy: Secondary | ICD-10-CM | POA: Diagnosis not present

## 2021-01-27 DIAGNOSIS — E1169 Type 2 diabetes mellitus with other specified complication: Secondary | ICD-10-CM | POA: Insufficient documentation

## 2021-01-27 DIAGNOSIS — R609 Edema, unspecified: Secondary | ICD-10-CM | POA: Diagnosis not present

## 2021-01-27 DIAGNOSIS — I878 Other specified disorders of veins: Secondary | ICD-10-CM | POA: Diagnosis not present

## 2021-01-27 DIAGNOSIS — I10 Essential (primary) hypertension: Secondary | ICD-10-CM | POA: Diagnosis not present

## 2021-01-27 DIAGNOSIS — Z6841 Body Mass Index (BMI) 40.0 and over, adult: Secondary | ICD-10-CM

## 2021-01-27 DIAGNOSIS — E669 Obesity, unspecified: Secondary | ICD-10-CM

## 2021-01-27 MED ORDER — FUROSEMIDE 40 MG PO TABS: 80.0000 mg | ORAL_TABLET | Freq: Every day | ORAL | 3 refills | Status: DC

## 2021-01-27 NOTE — Progress Notes (Signed)
Cardiology Office Note:    Date:  01/27/2021   ID:  Philip Conley, DOB 16-Nov-1970, MRN [id number]  PCP:  Wendie Agreste, MD  Cardiologist:  Buford Dresser, MD  Referring MD: Wendie Agreste, MD   CC: follow-up  History of Present Illness:    Philip Conley is a 50 y.o. male with a hx of hypertension, LE edema who is seen for follow-up. I initially met him 07/24/2019 as a new consult at the request of Wendie Agreste, MD for the evaluation and management of hypertension with edema.  Today: Overall, he says he is better. He states his lower extremity edema has improved, but he adds that the weather maybe contributing to the "puffiness" in his legs today. His blood pressure has also improved and his weight has reduced. He adds that he does not have any recurrent "abdominal hardness."  At home he says his weight fluctuates but has consistently declined, and he believes his edema might have been the cause of his weight before.   If he does not stay adequately hydrated, he says the Lasix will take all the moisture from his hands and cause muscle cramps.   In his regimen, he Is taking his morning doses around 5-6 AM, and his second doses 30 minutes before dinner usually around 5-6 PM.  For his diet, he typically refills 32-oz Gatorade bottles, and may drink up to 4 bottles of water in a day. He also eats apples, and 3 pieces of cheese in the morning before taking his potassium. He may drink 1-2 sodas daily but does not drink coffee.   He has been trying to manage his sugars and keep them down.  He denies any palpitations, chest pain, or shortness of breath. No lightheadedness, headaches, syncope, orthopnea, PND, lower extremity edema or exertional symptoms.   Past Medical History:  Diagnosis Date   Allergy    Hx of adenomatous and sessile serrated colonic polyps 02/18/2019   Hypertension     Past Surgical History:  Procedure Laterality Date   HAND SURGERY     Growth  removal     Current Medications: Current Outpatient Medications on File Prior to Visit  Medication Sig   Aspirin-Acetaminophen-Caffeine (EXCEDRIN MIGRAINE PO) Take by mouth as needed. Reported on 07/05/2015   carvedilol (COREG) 6.25 MG tablet TAKE 1 TABLET(6.25 MG) BY MOUTH TWICE DAILY WITH A MEAL   cetirizine (ZYRTEC) 10 MG tablet TAKE 1 TABLET(10 MG) BY MOUTH AT BEDTIME   cyclobenzaprine (FLEXERIL) 5 MG tablet TAKE 1 TABLET(5 MG) BY MOUTH THREE TIMES DAILY AS NEEDED FOR MUSCLE SPASMS   Insulin Pen Needle (PEN NEEDLES) 32G X 4 MM MISC Use once daily to inject Saxenda.   Liraglutide -Weight Management (SAXENDA) 18 MG/3ML SOPN Inject 0.33m into the abdomen daily x7 days, then increase to 1.249mx7d, 1.73m73m7d, 2.4mg74md, then 3mg 32mly.   olmesartan (BENICAR) 40 MG tablet TAKE 1 TABLET(40 MG) BY MOUTH DAILY   pantoprazole (PROTONIX) 20 MG tablet TAKE 1 TABLET(20 MG) BY MOUTH DAILY BEFORE SUPPER   potassium chloride SA (KLOR-CON) 20 MEQ tablet TAKE 3 TABLETS BY MOUTH TWICE DAILY   spironolactone (ALDACTONE) 25 MG tablet Take 1 tablet (25 mg total) by mouth daily.   Current Facility-Administered Medications on File Prior to Visit  Medication   0.9 %  sodium chloride infusion     Allergies:   Banana   Social History   Tobacco Use   Smoking status: Never   Smokeless tobacco: Never  Vaping Use   Vaping Use: Never used  Substance Use Topics   Alcohol use: Not Currently    Comment: occ   Drug use: Never    Family History: family history includes Heart disease in his maternal grandmother, mother, and paternal grandmother; Hypertension in his mother; Lung disease in his maternal grandfather; Lupus in his father; Stroke in his father and paternal grandfather. There is no history of Colon polyps, Esophageal cancer, Rectal cancer, or Stomach cancer.  ROS:   Please see the history of present illness. (+) Nausea (if he takes potassium right before he eats) All other systems are reviewed and  negative.    EKGs/Labs/Other Studies Reviewed:    The following studies were reviewed today:  Echo 08/15/2019:  1. Left ventricular ejection fraction, by estimation, is 60 to 65%. The  left ventricle has normal function. The left ventricle has no regional  wall motion abnormalities. Left ventricular diastolic parameters were  normal.   2. Right ventricular systolic function is normal. The right ventricular  size is normal. There is normal pulmonary artery systolic pressure.   3. The mitral valve is normal in structure. No evidence of mitral valve  regurgitation. No evidence of mitral stenosis.   4. The aortic valve is normal in structure. Aortic valve regurgitation is  not visualized. No aortic stenosis is present.   5. The inferior vena cava is normal in size with greater than 50%  respiratory variability, suggesting right atrial pressure of 3 mmHg.  LE Venous Reflux 01/10/2018: Summary:  Right: No reflux was noted in the femoral vein in the thigh, popliteal  vein, great saphenous vein at the saphenofemoral junction, great saphenous vein at the proximal thigh, great saphenous vein at the mid thigh, great saphenous vein at the distal thigh,  great saphenous vein at the knee, origin of the small saphenous vein, proximal small saphenous vein, and mid small saphenous vein. Abnormal reflux times were noted in the common femoral vein. Evidence of chronic venous insufficiency is detected in the deep venous system. There is no evidence of deep vein thrombosis in the lower extremity. There is no evidence of superficial venous thrombosis. No cystic structure found in the popliteal fossa.  Left: No reflux was noted in the femoral vein in the thigh, popliteal  vein, great saphenous vein at the saphenofemoral junction, great saphenous vein at the proximal thigh, great saphenous vein at the mid thigh, great saphenous vein at the distal thigh,  great saphenous vein at the knee, origin of the small  saphenous vein, proximal small saphenous vein, and mid small saphenous vein. Abnormal reflux times were noted in the common femoral vein. Evidence of chronic venous insufficiency is detected in the  deep venous system. There is no evidence of deep vein thrombosis in the lower extremity. There is no evidence of superficial venous thrombosis. No cystic structure found in the popliteal fossa.  EKG:  EKG is personally reviewed.   11/26/2020: NSR at 84 bpm 07/24/2019: NSR at 77 bpm  Recent Labs: 11/28/2020: ALT 65 12/23/2020: BUN 11; Creatinine, Ser 1.06; Potassium 4.5; Sodium 138  Recent Lipid Panel    Component Value Date/Time   CHOL 184 11/28/2020 0707   TRIG 159 (H) 11/28/2020 0707   HDL 33 (L) 11/28/2020 0707   CHOLHDL 5.6 (H) 11/28/2020 0707   CHOLHDL 5.7 01/25/2012 1107   VLDL 26 01/25/2012 1107   LDLCALC 122 (H) 11/28/2020 0707    Physical Exam:    VS:  BP 102/72 (BP  Location: Left Arm, Patient Position: Sitting, Cuff Size: Large)   Pulse 93   Ht _0  (1.676 m)   Wt 297 lb 12.8 oz (135.1 kg)   SpO2 97%   BMI 48.07 kg/m     Wt Readings from Last 3 Encounters:  01/27/21 297 lb 12.8 oz (135.1 kg)  12/23/20 (!) 306 lb 12.8 oz (139.2 kg)  11/26/20 300 lb (136.1 kg)    GEN: Well nourished, well developed in no acute distress HEENT: Normal, moist mucous membranes NECK: No JVD appreciated CARDIAC: regular rhythm, normal S1 and S2, no rubs or gallops. No murmurs. VASCULAR: Radial and DP pulses 2+ bilaterally. No carotid bruits RESPIRATORY:  Clear to auscultation without rales, wheezing or rhonchi  ABDOMEN: Soft, non-tender, non-distended MUSCULOSKELETAL:  Ambulates independently SKIN: Warm and dry, with brawny bilateral LE edema and skin discoloration. Slightly less firm compared to prior NEUROLOGIC:  Alert and oriented x 3. No focal neuro deficits noted. PSYCHIATRIC:  Normal affect    ASSESSMENT:    1. Peripheral edema   2. Medication management   3. Essential  hypertension   4. Chronic venous stasis   5. BMI 45.0-49.9, adult ([place])   6. Diabetes mellitus type 2 in obese [facility])      PLAN:    Lower extremity edema -improving gradually on AM lasix and PM spironolactone -echo unremarkable -chronic venous stasis changes -reviewed salt avoidance, compression, elevation.  -he drinks a lot of fluid daily, which we have discussed previously. Would try to decrease fluid intake over time.  Hypertension:  -BP much improved today -tolerating spironolactone well. Had normal BMET, but still taking a total of 120 mEq potassium daily. Will recheck to make sure that his K is not more elevated, may need to cut back on repletion -instructed on lifestyle changes today, including weight loss -continue carvedilol 6.25 mg BID, lasix 80 mg in the AM, spironolactone 25 mg in the PM, and olmesartan 40 mg daily  Morbid obesity, BMI 48 Type II diabetes (new) -started on liraglutide, tolerating well overall -last A1c 7.0% -lipids reviewed -discuss aspirin, statin at follow up  Cardiac risk counseling and prevention recommendations: -recommend heart healthy/Mediterranean diet, with whole grains, fruits, vegetable, fish, lean meats, nuts, and olive oil. Limit salt. -recommend moderate walking, 3-5 times/week for 30-50 minutes each session. Aim for at least 150 minutes.week. Goal should be Conley of 3 miles/hours, or walking 1.5 miles in 30 minutes -recommend avoidance of tobacco products. Avoid excess alcohol.  -ASCVD risk score: The 10-year ASCVD risk score (Arnett DK, et al., 2019) is: 8.4%   Values used to calculate the score:     Age: 56 years     Sex: Male     Is Non-Hispanic African American: No     Diabetic: Yes     Tobacco smoker: No     Systolic Blood Pressure: 740 mmHg     Is BP treated: Yes     HDL Cholesterol: 33 mg/dL     Total Cholesterol: 184 mg/dL    Plan for follow up: 3 mos or sooner as needed.  Buford Dresser, MD, PhD [place]   [facility]    Medication Adjustments/Labs and Tests Ordered: Current medicines are reviewed at length with the patient today.  Concerns regarding medicines are outlined above.   Orders Placed This Encounter  Procedures   Basic metabolic panel     Meds ordered this encounter  Medications   furosemide (LASIX) 40 MG tablet    Sig:  Take 2 tablets (80 mg total) by mouth daily.    Dispense:  180 tablet    Refill:  3     Patient Instructions  Medication Instructions:  Your Physician recommend you continue on your current medication as directed.    *If you need a refill on your cardiac medications before your next appointment, please call your pharmacy*   Lab Work: Your provider has recommended lab work in 1-2 weeks (BMP). Please have this collected at [facility] at [place]. The lab is open 8:00 am - 4:30 pm. Please avoid 12:00p - 1:00p for lunch hour. You do not need an appointment. Please go to [address] [place], [place] [address]. This is in the Primary Care office on the 3rd floor, let them know you are there for blood work and they will direct you to the lab.    Testing/Procedures: None ordered today   Follow-Up: At [facility], you and your health needs are our priority.  As part of our continuing mission to provide you with exceptional heart care, we have created designated Provider Care Teams.  These Care Teams include your primary Cardiologist (physician) and Advanced Practice Providers (APPs -  Physician Assistants and Nurse Practitioners) who all work together to provide you with the care you need, when you need it.  We recommend signing up for the patient portal called "MyChart".  Sign up information is provided on this After Visit Summary.  MyChart is used to connect with patients for Virtual Visits (Telemedicine).  Patients are able to view lab/test results, encounter notes, upcoming appointments, etc.  Non-urgent messages can be  sent to your provider as well.   To learn more about what you can do with MyChart, go to [email or web address].    Your next appointment:   3 month(s)  The format for your next appointment:   In Person  Provider:   Buford Dresser, MD       [facility] Stump[place],acting as a scribe for Buford Dresser, MD.,have documented all relevant documentation on the behalf of Buford Dresser, MD,as directed by  Buford Dresser, MD while in the presence of Buford Dresser, MD.  I, Buford Dresser, MD, have reviewed all documentation for this visit. The documentation on 01/27/21 for the exam, diagnosis, procedures, and orders are all accurate and complete.   Signed, Buford Dresser, MD PhD 01/27/2021  [place]

## 2021-01-27 NOTE — Patient Instructions (Signed)
Medication Instructions:  Your Physician recommend you continue on your current medication as directed.    *If you need a refill on your cardiac medications before your next appointment, please call your pharmacy*   Lab Work: Your provider has recommended lab work in 1-2 weeks (BMP). Please have this collected at [facility] at [place]. The lab is open 8:00 am - 4:30 pm. Please avoid 12:00p - 1:00p for lunch hour. You do not need an appointment. Please go to [address] [place], [place] [address]. This is in the Primary Care office on the 3rd floor, let them know you are there for blood work and they will direct you to the lab.    Testing/Procedures: None ordered today   Follow-Up: At [facility], you and your health needs are our priority.  As part of our continuing mission to provide you with exceptional heart care, we have created designated Provider Care Teams.  These Care Teams include your primary Cardiologist (physician) and Advanced Practice Providers (APPs -  Physician Assistants and Nurse Practitioners) who all work together to provide you with the care you need, when you need it.  We recommend signing up for the patient portal called "MyChart".  Sign up information is provided on this After Visit Summary.  MyChart is used to connect with patients for Virtual Visits (Telemedicine).  Patients are able to view lab/test results, encounter notes, upcoming appointments, etc.  Non-urgent messages can be sent to your provider as well.   To learn more about what you can do with MyChart, go to [email or web address].    Your next appointment:   3 month(s)  The format for your next appointment:   In Person  Provider:   Buford Dresser, MD

## 2021-02-02 ENCOUNTER — Telehealth: Payer: Self-pay | Admitting: Student-PharmD

## 2021-02-02 MED ORDER — WEGOVY 1.7 MG/0.75ML ~~LOC~~ SOAJ: 1.7000 mg | SUBCUTANEOUS | 0 refills | Status: DC

## 2021-02-02 NOTE — Telephone Encounter (Signed)
Patient called with update on Saxenda - states he is doing well and rarely has some mild nausea when he takes his dose too close to dinner. He had lost 9 lbs in ~1 month at last office visit on 11/15. He has 1 day remaining of Saxenda 0.6 mg and will start Wegovy 1.7 mg weekly tomorrow. I explained the difference in administration between [place] and he confirmed understanding. I will follow up with him in a few weeks to see how he is doing on this dose and plan to increase to max dose of 2.4 mg weekly if tolerating well.

## 2021-02-09 ENCOUNTER — Telehealth (HOSPITAL_BASED_OUTPATIENT_CLINIC_OR_DEPARTMENT_OTHER): Payer: Self-pay | Admitting: Cardiology

## 2021-02-09 NOTE — Telephone Encounter (Signed)
Philip Conley ([name]) Rx #: [id number] PZXAQW 1.7MG /0.75ML auto-injectors   Form [employer] [place] [employer] Electronic Request Form (CB)  Created 3 days ago  Sent to Plan 33 minutes ago  Determination Wait for Determination  Please wait for [employer] to return a determination.  Your information has been submitted to [place]. [employer] [place] will review the request and notify you of the determination decision directly, typically within 72 hours of receiving all information.  You will also receive your request decision electronically. To check for an update later, open this request again from your dashboard.  If [employer] [place] has not responded within the specified timeframe or if you have any questions about your PA submission, contact [place] directly at [phone].

## 2021-02-10 DIAGNOSIS — Z79899 Other long term (current) drug therapy: Secondary | ICD-10-CM | POA: Diagnosis not present

## 2021-02-11 LAB — BASIC METABOLIC PANEL
BUN/Creatinine Ratio: 11 (ref 9–20)
BUN: 14 mg/dL (ref 6–24)
CO2: 23 mmol/L (ref 20–29)
Calcium: 9.3 mg/dL (ref 8.7–10.2)
Chloride: 105 mmol/L (ref 96–106)
Creatinine, Ser: 1.25 mg/dL (ref 0.76–1.27)
Glucose: 128 mg/dL — ABNORMAL HIGH (ref 70–99)
Potassium: 4.5 mmol/L (ref 3.5–5.2)
Sodium: 142 mmol/L (ref 134–144)
eGFR: 69 mL/min/{1.73_m2} (ref >59)

## 2021-02-12 NOTE — Telephone Encounter (Signed)
Patient was calling ask to speak with you. Please advise

## 2021-02-12 NOTE — Telephone Encounter (Signed)
Patient called saying he has been unable to fill his Wegovy at his pharmacy. I called his pharmacy who said the PA has been approved and they are able to run it and they have it in stock. I called the patient back who is aware and he will be able to pick it up today.

## 2021-02-25 ENCOUNTER — Ambulatory Visit (HOSPITAL_BASED_OUTPATIENT_CLINIC_OR_DEPARTMENT_OTHER): Payer: BC Managed Care – PPO | Admitting: Cardiology

## 2021-03-03 ENCOUNTER — Telehealth: Payer: Self-pay | Admitting: Student-PharmD

## 2021-03-03 MED ORDER — WEGOVY 2.4 MG/0.75ML ~~LOC~~ SOAJ: 2.4000 mg | SUBCUTANEOUS | 11 refills | Status: DC

## 2021-03-03 NOTE — Telephone Encounter (Signed)
Called patient to see how he is doing on Wegovy 1.7 mg. His fourth dose is this Friday. He is not having any issues on it and would like to go up to 2.4 mg weekly which is the max dose he will remain on. I have sent this in to his pharmacy. He is scheduled for his 3 month follow up visit on 03/31/21 with the [facility] pharmacists.

## 2021-03-10 ENCOUNTER — Telehealth: Payer: Self-pay | Admitting: Pharmacist

## 2021-03-10 NOTE — Telephone Encounter (Signed)
Patient called stating that his pharmacy is telling him he needs a PA for the [facility] 1.7. I have submitted PA though covermymeds Key [id number]

## 2021-03-11 NOTE — Telephone Encounter (Signed)
PA has been canceled which makes me think there is already an acitve PA on file. I called [facility] who said that its going through but they cant get Wegovy. I advised that the higher strengths are available. She states she will try to order.  Called and let pt know. Advised that he speak with [facility] or try to see if another pharmacy has it.

## 2021-03-21 ENCOUNTER — Other Ambulatory Visit: Payer: Self-pay | Admitting: Cardiology

## 2021-03-30 NOTE — Progress Notes (Signed)
Patient ID: Pheonix Clinkscale                 DOB: Dec 08, 1970                    MRN: [id number]    HPI: Philip Conley is a 51 y.o. male patient referred to pharmacy clinic by Dr. Harrell Gave to initiate weight loss therapy with GLP1-RA. PMH is significant for obesity complicated by chronic medical conditions including HTN, HLD, newly diagnosed DM. Most recent BMI 48.44, 49.52 today. He started on Saxenda due to shortage problems with [facility] and has now switched over and titrated up to [facility] 2.4 mg weekly.   Today, patient reports he has taken Wegovy 2.4 mg for 3 weeks now. He endorses some nausea that comes and goes. Some days he doesn't have any. He finds that he is less nauseous when he is more active. He is trying to figure out if certain foods make him feel more nauseous than others. He states that the nausea is tolerable. He has now lost 23 lbs (7.5% of his body weight) after three months of treatment with GLP1 agonist and his weight today includes him wearing his steel toe boots.   Current weight management medications: Wegovy 2.4 mg weekly  Previously tried meds: none  Current meds that may affect weight: none  Baseline weight/BMI: 139.2 kg (BMI 49.52)  Insurance payor: BCBS/commercial  Diet: Eating about 1/3 of what he ate at baseline. Stops eating when he feels full.   Exercise: active walking dogs, in the yard  Family History: Heart disease in his maternal grandmother, mother, and paternal grandmother; Hypertension in his mother; Lung disease in his maternal grandfather; Lupus in his father; Stroke in his father and paternal grandfather.  Social History: Never smoker  Labs: Lab Results  Component Value Date   HGBA1C 7.0 (H) 11/28/2020    Wt Readings from Last 1 Encounters:  01/27/21 297 lb 12.8 oz (135.1 kg)    BP Readings from Last 1 Encounters:  01/27/21 102/72   Pulse Readings from Last 1 Encounters:  01/27/21 93       Component Value Date/Time   CHOL 184  11/28/2020 0707   TRIG 159 (H) 11/28/2020 0707   HDL 33 (L) 11/28/2020 0707   CHOLHDL 5.6 (H) 11/28/2020 0707   CHOLHDL 5.7 01/25/2012 1107   VLDL 26 01/25/2012 1107   LDLCALC 122 (H) 11/28/2020 0707    Past Medical History:  Diagnosis Date   Allergy    Hx of adenomatous and sessile serrated colonic polyps 02/18/2019   Hypertension     Current Outpatient Medications on File Prior to Visit  Medication Sig Dispense Refill   Aspirin-Acetaminophen-Caffeine (EXCEDRIN MIGRAINE PO) Take by mouth as needed. Reported on 07/05/2015     carvedilol (COREG) 6.25 MG tablet TAKE 1 TABLET(6.25 MG) BY MOUTH TWICE DAILY WITH A MEAL 180 tablet 0   cetirizine (ZYRTEC) 10 MG tablet TAKE 1 TABLET(10 MG) BY MOUTH AT BEDTIME 90 tablet 3   cyclobenzaprine (FLEXERIL) 5 MG tablet TAKE 1 TABLET(5 MG) BY MOUTH THREE TIMES DAILY AS NEEDED FOR MUSCLE SPASMS 15 tablet 0   furosemide (LASIX) 40 MG tablet Take 2 tablets (80 mg total) by mouth daily. 180 tablet 3   Insulin Pen Needle (PEN NEEDLES) 32G X 4 MM MISC Use once daily to inject Saxenda. 100 each 1   olmesartan (BENICAR) 40 MG tablet TAKE 1 TABLET(40 MG) BY MOUTH DAILY 90 tablet 1   pantoprazole (  PROTONIX) 20 MG tablet TAKE 1 TABLET(20 MG) BY MOUTH DAILY BEFORE SUPPER 90 tablet 3   potassium chloride SA (KLOR-CON) 20 MEQ tablet TAKE 3 TABLETS BY MOUTH TWICE DAILY 540 tablet 1   Semaglutide-Weight Management (WEGOVY) 2.4 MG/0.75ML SOAJ Inject 2.4 mg into the skin once a week. 3 mL 11   spironolactone (ALDACTONE) 25 MG tablet Take 1 tablet (25 mg total) by mouth daily. 90 tablet 3   Current Facility-Administered Medications on File Prior to Visit  Medication Dose Route Frequency Provider Last Rate Last Admin   0.9 %  sodium chloride infusion  500 mL Intravenous Once Gatha Mayer, MD        Allergies  Allergen Reactions   Banana Other (See Comments)    Constriction in mouth/throat. tongue swelling.      Assessment/Plan:  1. Weight loss - Patient has  lost 7.5% of his body weight since starting a GLP1 agonist for weight loss 3 months ago. He started on Saxenda due to supply chain shortages with [facility], and he has now been on the max dose of Wegovy for three weeks so we anticipate his weight loss to continue over the next few months. Also anticipate that nausea will continue to improve over time. Continue Wegovy 2.4 mg weekly. Encouraged him to keep a log of what he eats in a day and track when he experiences nausea so he can determine which foods may be contributing more than others. Discussed that if the nausea does not continue to improve over time we can consider decreasing the dose. Counseled him to call if nausea becomes intolerable prior to his next visit which is scheduled in April. Will check 6 month follow up labs at that time (A1c, lipid panel).   Rebbeca Paul, PharmD PGY2 Ambulatory Care Pharmacy Resident 03/30/2021 11:52 AM

## 2021-03-31 ENCOUNTER — Other Ambulatory Visit: Payer: Self-pay

## 2021-03-31 ENCOUNTER — Ambulatory Visit (INDEPENDENT_AMBULATORY_CARE_PROVIDER_SITE_OTHER): Payer: BC Managed Care – PPO | Admitting: Student-PharmD

## 2021-03-31 MED ORDER — WEGOVY 2.4 MG/0.75ML ~~LOC~~ SOAJ: 2.4000 mg | SUBCUTANEOUS | 11 refills | Status: DC

## 2021-03-31 NOTE — Patient Instructions (Addendum)
Nice seeing you today!  Continue Wegovy 2.4 mg weekly.   We will see you for follow up in 3 months.   Tips for living a healthier life     Building a Healthy and Balanced Diet Make most of your meal vegetables and fruits -  of your plate. Aim for color and variety, and remember that potatoes dont count as vegetables on the Healthy Eating Plate because of their negative impact on blood sugar.  Go for whole grains -  of your plate. Whole and intact grains--whole wheat, barley, wheat berries, quinoa, oats, brown rice, and foods made with them, such as whole wheat pasta--have a milder effect on blood sugar and insulin than white bread, white rice, and other refined grains.  Protein power -  of your plate. Fish, poultry, beans, and nuts are all healthy, versatile protein sources--they can be mixed into salads, and pair well with vegetables on a plate. Limit red meat, and avoid processed meats such as bacon and sausage.  Healthy plant oils - in moderation. Choose healthy vegetable oils like olive, canola, soy, corn, sunflower, peanut, and others, and avoid partially hydrogenated oils, which contain unhealthy trans fats. Remember that low-fat does not mean healthy.  Drink water, coffee, or tea. Skip sugary drinks, limit milk and dairy products to one to two servings per day, and limit juice to a small glass per day.  Stay active. The red figure running across the [place] is a reminder that staying active is also important in weight control.  The main message of the Healthy Eating Plate is to focus on diet quality:  The type of carbohydrate in the diet is more important than the amount of carbohydrate in the diet, because some sources of carbohydrate--like vegetables (other than potatoes), fruits, whole grains, and beans--are healthier than others. The Healthy Eating Plate also advises consumers to avoid sugary beverages, a major source of calories--usually with  little nutritional value--in the American diet. The Healthy Eating Plate encourages consumers to use healthy oils, and it does not set a maximum on the percentage of calories people should get each day from healthy sources of fat. In this way, the Healthy Eating Plate recommends the opposite of the low-fat message promoted for decades by the USDA.  DeskDistributor.no  SUGAR  Sugar is a huge problem in the modern day diet. Sugar is a big contributor to heart disease, diabetes, high triglyceride levels, fatty liver disease and obesity. Sugar is hidden in almost all packaged foods/beverages. Added sugar is extra sugar that is added beyond what is naturally found and has no nutritional benefit for your body. The American Heart Association recommends limiting added sugars to no more than 25g for women and 36 grams for men per day. There are many names for sugar including maltose, sucrose (names ending in "ose"), high fructose corn syrup, molasses, cane sugar, corn sweetener, raw sugar, syrup, honey or fruit juice concentrate.   One of the best ways to limit your added sugars is to stop drinking sweetened beverages such as soda, sweet tea, and fruit juice.  There is 65g of added sugars in one 20oz bottle of Coke! That is equal to 7.5 donuts.   Pay attention and read all nutrition facts labels. Below is an examples of a nutrition facts label. The #1 is showing you the total sugars where the # 2 is showing you the added sugars. This one serving has almost the max amount of added sugars per day!  20 oz Soda 65g Sugar = 7.5 Glazed Donuts  16oz Energy  Drink 54g Sugar = 6.5 Glazed Donuts  Large Sweet  Tea 38g Sugar = 4 Glazed Donuts  20oz Sports  Drink 34g Sugar = 3.5 Glazed Donuts  8oz Chocolate Milk 24g Sugar =2.5 Glazed Donuts  8oz Orange  Juice 21g Sugar = 2 Glazed Donuts  1 Juice Box 14g Sugar = 1.5 Glazed Donuts  16oz Water= NO  SUGAR!!  EXERCISE  Exercise is good. Weve all heard that. In an ideal world, we would all have time and resources to get plenty of it. When you are active, your heart pumps more efficiently and you will feel better.  Multiple studies show that even walking regularly has benefits that include living a longer life. The American Heart Association recommends 150 minutes per week of exercise (30 minutes per day most days of the week). You can do this in any increment you wish. Nine or more 10-minute walks count. So does an hour-long exercise class. Break the time apart into what will work in your life. Some of the best things you can do include walking briskly, jogging, cycling or swimming laps. Not everyone is ready to exercise. Sometimes we need to start with just getting active. Here are some easy ways to be more active throughout the day:  Take the stairs instead of the elevator  Go for a 10-15 minute walk during your lunch break (find a friend to make it more enjoyable)  When shopping, park at the back of the parking lot  If you take public transportation, get off one stop early and walk the extra distance  Pace around while making phone calls  Check with your doctor if you arent sure what your limitations may be. Always remember to drink plenty of water when doing any type of exercise. Dont feel like a failure if youre not getting the 90-150 minutes per week. If you started by being a couch potato, then just a 10-minute walk each day is a huge improvement. Start with little victories and work your way up.   HEALTHY EATING TIPS  When looking to improve your eating habits, whether to lose weight, lower blood pressure or just be healthier, it helps to know what a serving size is.   Grains 1 slice of bread,  bagel,  cup pasta or rice  Vegetables 1 cup fresh or raw vegetables,  cup cooked or canned Fruits 1 piece of medium sized fruit,  cup canned,   Meats/Proteins  cup dried       1 oz  meat, 1 egg,  cup cooked beans, nuts or seeds  Dairy        Fats Individual yogurt container, 1 cup (8oz)    1 teaspoon margarine/butter or vegetable  milk or milk alternative, 1 slice of cheese          oil; 1 tablespoon mayonnaise or salad dressing                  Plan ahead: make a menu of the meals for a week then create a grocery list to go with that menu. Consider meals that easily stretch into a night of leftovers, such as stews or casseroles. Or consider making two of your favorite meal and put one in the freezer for another night. Try a night or two each week that is meatless or no cook such as salads. When you get home from the grocery store wash and prepare  your vegetables and fruits. Then when you need them they are ready to go.   Tips for going to the grocery store:  [place] store or generic brands  Check the weekly ad from your store on-line or in their in-store flyer  Look at the unit price on the shelf tag to compare/contrast the costs of different items  Buy fruits/vegetables in season  Carrots, bananas and apples are low-cost, naturally healthy items  If meats or frozen vegetables are on sale, buy some extras and put in your freezer  Limit buying prepared or ready to eat items, even if they are pre-made salads or fruit snacks  Do not shop when youre hungry  Foods at eye level tend to be more expensive. Look on the high and low shelves for deals.  Consider shopping at the farmers market for fresh foods in season.  Avoid the cookie and chip aisles (these are expensive, high in calories and low in nutritional value). Shop on the outside of the grocery store.  Healthy food preparations:  If you cant get lean hamburger, be sure to drain the fat when cooking  Steam, saut (in olive oil), grill or bake foods  Experiment with different seasonings to avoid adding salt to your foods. Kosher salt, sea salt and Himalayan salt are all still salt and should be avoided. Try  seasoning food with onion, garlic, thyme, rosemary, basil ect. Onion powder or garlic powder is ok. Avoid if it says salt (ie garlic salt).

## 2021-04-23 ENCOUNTER — Other Ambulatory Visit: Payer: Self-pay | Admitting: Family Medicine

## 2021-04-23 DIAGNOSIS — I10 Essential (primary) hypertension: Secondary | ICD-10-CM

## 2021-04-29 ENCOUNTER — Encounter (HOSPITAL_BASED_OUTPATIENT_CLINIC_OR_DEPARTMENT_OTHER): Payer: Self-pay | Admitting: Cardiology

## 2021-04-29 ENCOUNTER — Ambulatory Visit (INDEPENDENT_AMBULATORY_CARE_PROVIDER_SITE_OTHER): Payer: Managed Care, Other (non HMO) | Admitting: Cardiology

## 2021-04-29 ENCOUNTER — Other Ambulatory Visit: Payer: Self-pay

## 2021-04-29 DIAGNOSIS — E1169 Type 2 diabetes mellitus with other specified complication: Secondary | ICD-10-CM

## 2021-04-29 DIAGNOSIS — E669 Obesity, unspecified: Secondary | ICD-10-CM

## 2021-04-29 DIAGNOSIS — I10 Essential (primary) hypertension: Secondary | ICD-10-CM | POA: Diagnosis not present

## 2021-04-29 DIAGNOSIS — I872 Venous insufficiency (chronic) (peripheral): Secondary | ICD-10-CM

## 2021-04-29 DIAGNOSIS — Z7189 Other specified counseling: Secondary | ICD-10-CM

## 2021-04-29 MED ORDER — FUROSEMIDE 40 MG PO TABS: 80.0000 mg | ORAL_TABLET | Freq: Every day | ORAL | 3 refills | Status: DC

## 2021-04-29 MED ORDER — SPIRONOLACTONE 25 MG PO TABS: 25.0000 mg | ORAL_TABLET | Freq: Every day | ORAL | 3 refills | Status: DC

## 2021-04-29 MED ORDER — OLMESARTAN MEDOXOMIL 40 MG PO TABS: 20.0000 mg | ORAL_TABLET | Freq: Every day | ORAL | 1 refills | Status: DC

## 2021-04-29 MED ORDER — CARVEDILOL 6.25 MG PO TABS: ORAL_TABLET | ORAL | 3 refills | Status: DC

## 2021-04-29 MED ORDER — POTASSIUM CHLORIDE CRYS ER 20 MEQ PO TBCR: 40.0000 meq | EXTENDED_RELEASE_TABLET | Freq: Two times a day (BID) | ORAL | 3 refills | Status: DC

## 2021-04-29 NOTE — Progress Notes (Signed)
Cardiology Office Note:    Date:  04/29/2021   ID:  Philip Conley, DOB Jun 06, 1970, MRN [id number]  PCP:  Wendie Agreste, MD  Cardiologist:  Philip Dresser, MD  Referring MD: Wendie Agreste, MD   CC: follow-up  History of Present Illness:    Philip Conley is a 51 y.o. male with a hx of hypertension, LE edema who is seen for follow-up. I initially met him 07/24/2019 as a new consult at the request of Wendie Agreste, MD for the evaluation and management of hypertension with edema.  Today: He is doing well. However, he reports having more frequent dizziness episodes. Going from sitting or laying to standing causes him to feel wobbly and pause. He wonders if his lower blood pressure is related to the dizziness. He records his blood pressure at home using both a wrist and arm cuff. Generally, his wrist cuff tends to read lower. His measurements tend to be 31V to 400Q systolic.  He has not noticed recent LE edema. He takes spironolactone at night and wakes up at 3AM nightly to go to the bathroom. He takes Lasix and olmesartan in the morning. He has lost 30 lbs. His breathing has improved with less fluid retention.   He is using Wegovy to help with weight loss and has noticed a decreased appetite. Since having a smaller appetite, he reports acid reflux after eating.   He used to sleep on his left side. However, he has been unable to sleep on his left because, doing so, causes him to feel nauseous.   Denies chest pain, shortness of breath at rest or with normal exertion. No PND, orthopnea, LE edema or unexpected weight gain. No syncope or palpitations.  Past Medical History:  Diagnosis Date   Allergy    Hx of adenomatous and sessile serrated colonic polyps 02/18/2019   Hypertension     Past Surgical History:  Procedure Laterality Date   HAND SURGERY     Growth removal     Current Medications: Current Outpatient Medications on File Prior to Visit  Medication Sig    Aspirin-Acetaminophen-Caffeine (EXCEDRIN MIGRAINE PO) Take by mouth as needed. Reported on 07/05/2015   cetirizine (ZYRTEC) 10 MG tablet TAKE 1 TABLET(10 MG) BY MOUTH AT BEDTIME   cyclobenzaprine (FLEXERIL) 5 MG tablet TAKE 1 TABLET(5 MG) BY MOUTH THREE TIMES DAILY AS NEEDED FOR MUSCLE SPASMS   pantoprazole (PROTONIX) 20 MG tablet TAKE 1 TABLET(20 MG) BY MOUTH DAILY BEFORE SUPPER   Semaglutide-Weight Management (WEGOVY) 2.4 MG/0.75ML SOAJ Inject 2.4 mg into the skin once a week.   Current Facility-Administered Medications on File Prior to Visit  Medication   0.9 %  sodium chloride infusion     Allergies:   Banana   Social History   Tobacco Use   Smoking status: Never   Smokeless tobacco: Never  Vaping Use   Vaping Use: Never used  Substance Use Topics   Alcohol use: Not Currently    Comment: occ   Drug use: Never    Family History: family history includes Heart disease in his maternal grandmother, mother, and paternal grandmother; Hypertension in his mother; Lung disease in his maternal grandfather; Lupus in his father; Stroke in his father and paternal grandfather. There is no history of Colon polyps, Esophageal cancer, Rectal cancer, or Stomach cancer.  ROS:   Please see the history of present illness. (+) Dizziness (+) Acid reflux All other systems are reviewed and negative.   EKGs/Labs/Other Studies Reviewed:  The following studies were reviewed today: Echo 08/15/2019:  1. Left ventricular ejection fraction, by estimation, is 60 to 65%. The  left ventricle has normal function. The left ventricle has no regional  wall motion abnormalities. Left ventricular diastolic parameters were  normal.   2. Right ventricular systolic function is normal. The right ventricular  size is normal. There is normal pulmonary artery systolic pressure.   3. The mitral valve is normal in structure. No evidence of mitral valve  regurgitation. No evidence of mitral stenosis.   4. The aortic  valve is normal in structure. Aortic valve regurgitation is  not visualized. No aortic stenosis is present.   5. The inferior vena cava is normal in size with greater than 50%  respiratory variability, suggesting right atrial pressure of 3 mmHg.  LE Venous Reflux 01/10/2018: Summary:  Right: No reflux was noted in the femoral vein in the thigh, popliteal  vein, great saphenous vein at the saphenofemoral junction, great saphenous vein at the proximal thigh, great saphenous vein at the mid thigh, great saphenous vein at the distal thigh,  great saphenous vein at the knee, origin of the small saphenous vein, proximal small saphenous vein, and mid small saphenous vein. Abnormal reflux times were noted in the common femoral vein. Evidence of chronic venous insufficiency is detected in the deep venous system. There is no evidence of deep vein thrombosis in the lower extremity. There is no evidence of superficial venous thrombosis. No cystic structure found in the popliteal fossa.  Left: No reflux was noted in the femoral vein in the thigh, popliteal  vein, great saphenous vein at the saphenofemoral junction, great saphenous vein at the proximal thigh, great saphenous vein at the mid thigh, great saphenous vein at the distal thigh,  great saphenous vein at the knee, origin of the small saphenous vein, proximal small saphenous vein, and mid small saphenous vein. Abnormal reflux times were noted in the common femoral vein. Evidence of chronic venous insufficiency is detected in the  deep venous system. There is no evidence of deep vein thrombosis in the lower extremity. There is no evidence of superficial venous thrombosis. No cystic structure found in the popliteal fossa.  EKG:  EKG was not ordered today  11/26/2020: NSR at 84 bpm 07/24/2019: NSR at 77 bpm  Recent Labs: 11/28/2020: ALT 65 02/10/2021: BUN 14; Creatinine, Ser 1.25; Potassium 4.5; Sodium 142  Recent Lipid Panel    Component Value Date/Time    CHOL 184 11/28/2020 0707   TRIG 159 (H) 11/28/2020 0707   HDL 33 (L) 11/28/2020 0707   CHOLHDL 5.6 (H) 11/28/2020 0707   CHOLHDL 5.7 01/25/2012 1107   VLDL 26 01/25/2012 1107   LDLCALC 122 (H) 11/28/2020 0707    Physical Exam:    VS:  BP 93/64    Pulse 94    Ht 5' 6" (1.676 m)    Wt 277 lb (125.6 kg)    SpO2 97%    BMI 44.71 kg/m     Wt Readings from Last 3 Encounters:  04/29/21 277 lb (125.6 kg)  03/31/21 283 lb 12.8 oz (128.7 kg)  01/27/21 297 lb 12.8 oz (135.1 kg)    GEN: Well nourished, well developed in no acute distress HEENT: Normal, moist mucous membranes NECK: No JVD CARDIAC: regular rhythm, normal S1 and S2, no rubs or gallops. No murmur. VASCULAR: Radial and DP pulses 2+ bilaterally. No carotid bruits RESPIRATORY:  Clear to auscultation without rales, wheezing or rhonchi  ABDOMEN: Soft,  non-tender, non-distended MUSCULOSKELETAL:  Ambulates independently SKIN: Warm and dry, no edema NEUROLOGIC:  Alert and oriented x 3. No focal neuro deficits noted. PSYCHIATRIC:  Normal affect   ASSESSMENT:    1. Morbid obesity ([place])   2. Essential hypertension   3. Chronic venous insufficiency   4. Diabetes mellitus type 2 in obese (HCC)   5. Cardiac risk counseling       PLAN:    Lower extremity edema -much improved with weight loss, doing well on lasix and spironolactone -echo unremarkable -chronic venous stasis changes -reviewed salt avoidance, compression, elevation.   Hypertension:  -BP low today, has had some lightheadedness -with weight loss, likely will need to cut back on medication -will start with decreasing olmesartan to 20 mg daily. As his home cuff is not reliable, will have him come back for nurse BP check in 3-4 weeks. If still low, would stop olmesartan completely -continue carvedilol 6.25 mg BID, lasix 80 mg in the AM, spironolactone 25 mg in the PM, and olmesartan as above -will have him see me in 2 mos, recheck bmet at that time  Morbid  obesity, BMI 48-->44 Type II diabetes -losing weight on wegovy, >30 lbs thus far -last A1c 7.0% -discuss aspirin, statin at follow up, wants to work on weight loss first  Cardiac risk counseling and prevention recommendations: -recommend heart healthy/Mediterranean diet, with whole grains, fruits, vegetable, fish, lean meats, nuts, and olive oil. Limit salt. -recommend moderate walking, 3-5 times/week for 30-50 minutes each session. Aim for at least 150 minutes.week. Goal should be Conley of 3 miles/hours, or walking 1.5 miles in 30 minutes -recommend avoidance of tobacco products. Avoid excess alcohol.  -ASCVD risk score: The 10-year ASCVD risk score (Arnett DK, et al., 2019) is: 7.2%   Values used to calculate the score:     Age: 21 years     Sex: Male     Is Non-Hispanic African American: No     Diabetic: Yes     Tobacco smoker: No     Systolic Blood Pressure: 93 mmHg     Is BP treated: Yes     HDL Cholesterol: 33 mg/dL     Total Cholesterol: 184 mg/dL    Plan for follow up: 2 months  Philip Dresser, MD, PhD [place]   [facility]    Medication Adjustments/Labs and Tests Ordered: Current medicines are reviewed at length with the patient today.  Concerns regarding medicines are outlined above.   No orders of the defined types were placed in this encounter.    Meds ordered this encounter  Medications   olmesartan (BENICAR) 40 MG tablet    Sig: Take 0.5 tablets (20 mg total) by mouth daily.    Dispense:  90 tablet    Refill:  1   carvedilol (COREG) 6.25 MG tablet    Sig: TAKE 1 TABLET(6.25 MG) BY MOUTH TWICE DAILY WITH A MEAL    Dispense:  180 tablet    Refill:  3   furosemide (LASIX) 40 MG tablet    Sig: Take 2 tablets (80 mg total) by mouth daily.    Dispense:  180 tablet    Refill:  3   spironolactone (ALDACTONE) 25 MG tablet    Sig: Take 1 tablet (25 mg total) by mouth daily.    Dispense:  90 tablet    Refill:  3   potassium chloride SA (KLOR-CON  M) 20 MEQ tablet    Sig: Take 2 tablets (40 mEq  total) by mouth 2 (two) times daily.    Dispense:  360 tablet    Refill:  3     Patient Instructions  Medication Instructions:  Your physician has recommended you make the following change in your medication:   Reduce Olmesartan to a half tablet daily   We have refilled your medications!   *If you need a refill on your cardiac medications before your next appointment, please call your pharmacy*   Lab Work: None ordered today   Testing/Procedures: None ordered today    Follow-Up: At [facility], you and your health needs are our priority.  As part of our continuing mission to provide you with exceptional heart care, we have created designated Provider Care Teams.  These Care Teams include your primary Cardiologist (physician) and Advanced Practice Providers (APPs -  Physician Assistants and Nurse Practitioners) who all work together to provide you with the care you need, when you need it.  We recommend signing up for the patient portal called "MyChart".  Sign up information is provided on this After Visit Summary.  MyChart is used to connect with patients for Virtual Visits (Telemedicine).  Patients are able to view lab/test results, encounter notes, upcoming appointments, etc.  Non-urgent messages can be sent to your provider as well.   To learn more about what you can do with MyChart, go to [email or web address].    Your next appointment:   Nurse Visit with Eual Fines on March 6th at 1:15pm        And   2 Month follow up with Dr. Harrell Gave on Monday April 3rd at [place]!    Wilhemina Bonito as a scribe for Philip Dresser, MD.,have documented all relevant documentation on the behalf of Philip Dresser, MD,as directed by  Philip Dresser, MD while in the presence of Philip Dresser, MD.  I, Philip Dresser, MD, have reviewed all documentation for this visit. The documentation on  04/29/21 for the exam, diagnosis, procedures, and orders are all accurate and complete.   Signed, Philip Dresser, MD PhD 04/29/2021  [place]

## 2021-04-29 NOTE — Patient Instructions (Addendum)
Medication Instructions:  Your physician has recommended you make the following change in your medication:   Reduce Olmesartan to a half tablet daily   We have refilled your medications!   *If you need a refill on your cardiac medications before your next appointment, please call your pharmacy*   Lab Work: None ordered today   Testing/Procedures: None ordered today    Follow-Up: At [facility], you and your health needs are our priority.  As part of our continuing mission to provide you with exceptional heart care, we have created designated Provider Care Teams.  These Care Teams include your primary Cardiologist (physician) and Advanced Practice Providers (APPs -  Physician Assistants and Nurse Practitioners) who all work together to provide you with the care you need, when you need it.  We recommend signing up for the patient portal called "MyChart".  Sign up information is provided on this After Visit Summary.  MyChart is used to connect with patients for Virtual Visits (Telemedicine).  Patients are able to view lab/test results, encounter notes, upcoming appointments, etc.  Non-urgent messages can be sent to your provider as well.   To learn more about what you can do with MyChart, go to [email or web address].    Your next appointment:   Nurse Visit with Eual Fines on March 6th at 1:15pm        And   2 Month follow up with Dr. Harrell Gave on Monday April 3rd at 4pm!

## 2021-05-05 ENCOUNTER — Telehealth (HOSPITAL_BASED_OUTPATIENT_CLINIC_OR_DEPARTMENT_OTHER): Payer: Self-pay | Admitting: Cardiology

## 2021-05-05 ENCOUNTER — Encounter (HOSPITAL_BASED_OUTPATIENT_CLINIC_OR_DEPARTMENT_OTHER): Payer: Self-pay

## 2021-05-05 NOTE — Telephone Encounter (Signed)
Received fax from [facility] requesting Prior Auth for Wegovy 2.4 mg. Submitted today.  Philip Conley (Key: [place]) Rx #: [id number] [phone] 2.4MG /0.75ML auto-injectors   Form True Rx General Prior Authorization Request Form  Plan Contact [phone] phone [phone] fax  Determination Wait for Determination Please wait for the payer to return a determination.

## 2021-05-12 NOTE — Telephone Encounter (Signed)
Received fax from [facility] pt has been approved for Wegovy 2.4 mg through 10/05/2021.

## 2021-05-18 ENCOUNTER — Encounter (HOSPITAL_BASED_OUTPATIENT_CLINIC_OR_DEPARTMENT_OTHER): Payer: Self-pay

## 2021-05-18 ENCOUNTER — Other Ambulatory Visit: Payer: Self-pay

## 2021-05-18 ENCOUNTER — Ambulatory Visit (INDEPENDENT_AMBULATORY_CARE_PROVIDER_SITE_OTHER): Payer: No Typology Code available for payment source

## 2021-05-18 VITALS — BP 103/72 | HR 82 | Ht 66.0 in | Wt 279.5 lb

## 2021-05-18 DIAGNOSIS — I10 Essential (primary) hypertension: Secondary | ICD-10-CM | POA: Diagnosis not present

## 2021-05-18 NOTE — Progress Notes (Signed)
? ?  Nurse Visit  ?  ?Date of Encounter: 05/18/2021 ?ID: Philip Conley, DOB 1970-08-25, MRN [id number] ? ?PCP:  Wendie Agreste, MD ?  ?Cardiologist:  Buford Dresser, MD  ?Advanced Practice Provider:  No care team member to display ?Electrophysiologist:  None  ?RN for nurse Visit- Elonda Husky, RN ?   ? ? ?Visit Details  ? ?VS:  BP 103/72   Pulse 82   Ht '5\' 6"'$  (1.676 m)   Wt 279 lb 8 oz (126.8 kg)   BMI 45.11 kg/m?  , BMI Body mass index is 45.11 kg/m?. ? ?Wt Readings from Last 3 Encounters:  ?05/18/21 279 lb 8 oz (126.8 kg)  ?04/29/21 277 lb (125.6 kg)  ?03/31/21 283 lb 12.8 oz (128.7 kg)  ?  ? ?Reason for visit: Blood Pressure Check  ?Performed today: Vitals and Provider consulted: Dr. Harrell Gave  ?Changes (medications, testing, etc.) : Stop Olmesartan  ?Length of Visit: 10 minutes ? ?Medications Adjustments/Labs and Tests Ordered: ?Stop Olmesartan  ? ? ?Signed, ?Gerald Stabs, RN  ?05/18/2021 2:30 PM ? ? ? ? ?

## 2021-05-18 NOTE — Patient Instructions (Signed)
Medication Instructions:  ?Your physician has recommended you make the following change in your medication:  ? ?Stop: Olmesartan  ? ?*If you need a refill on your cardiac medications before your next appointment, please call your pharmacy* ? ?Follow-Up: ?At [facility], you and your health needs are our priority.  As part of our continuing mission to provide you with exceptional heart care, we have created designated Provider Care Teams.  These Care Teams include your primary Cardiologist (physician) and Advanced Practice Providers (APPs -  Physician Assistants and Nurse Practitioners) who all work together to provide you with the care you need, when you need it. ? ?We recommend signing up for the patient portal called "MyChart".  Sign up information is provided on this After Visit Summary.  MyChart is used to connect with patients for Virtual Visits (Telemedicine).  Patients are able to view lab/test results, encounter notes, upcoming appointments, etc.  Non-urgent messages can be sent to your provider as well.   ?To learn more about what you can do with MyChart, go to [email or web address].   ? ?Your next appointment:   ?Follow up as scheduled with Dr. Harrell Gave  ? ? ? ? ?

## 2021-06-15 ENCOUNTER — Ambulatory Visit (INDEPENDENT_AMBULATORY_CARE_PROVIDER_SITE_OTHER): Payer: No Typology Code available for payment source | Admitting: Cardiology

## 2021-06-15 ENCOUNTER — Encounter (HOSPITAL_BASED_OUTPATIENT_CLINIC_OR_DEPARTMENT_OTHER): Payer: Self-pay | Admitting: Cardiology

## 2021-06-15 VITALS — BP 127/72 | HR 78 | Ht 66.0 in | Wt 272.2 lb

## 2021-06-15 DIAGNOSIS — Z7189 Other specified counseling: Secondary | ICD-10-CM

## 2021-06-15 DIAGNOSIS — I10 Essential (primary) hypertension: Secondary | ICD-10-CM | POA: Diagnosis not present

## 2021-06-15 DIAGNOSIS — E1169 Type 2 diabetes mellitus with other specified complication: Secondary | ICD-10-CM | POA: Diagnosis not present

## 2021-06-15 DIAGNOSIS — E669 Obesity, unspecified: Secondary | ICD-10-CM

## 2021-06-15 DIAGNOSIS — I872 Venous insufficiency (chronic) (peripheral): Secondary | ICD-10-CM | POA: Diagnosis not present

## 2021-06-15 NOTE — Patient Instructions (Signed)

## 2021-06-15 NOTE — Progress Notes (Signed)
?Cardiology Office Note:   ? ?Date:  06/15/2021  ? ?ID:  Philip Conley, DOB 1971-02-10, MRN [id number] ? ?PCP:  Philip Agreste, MD  ?Cardiologist:  Philip Dresser, MD ? ?Referring MD: Philip Agreste, MD  ? ?CC: follow-up ? ?History of Present Illness:   ? ?Philip Conley is a 51 y.o. male with a hx of hypertension, LE edema who is seen for follow-up. I initially met him 07/24/2019 as a new consult at the request of Philip Agreste, MD for the evaluation and management of hypertension with edema. ? ?Today: ?Since his last appointment, he presented for a nurse visit 05/18/2021 and his blood pressure was 103/72. Olmesartan was stopped. ? ?Overall, the patient is feeling good.  ? ?Blood pressure is looking well. Yesterday at home his bp was high at 140/104, but lowered to 122/81 in the evening. He reports having "weird numbers'" at random times. He believes his machine may be inaccurate. ? ?He reports that he continues to successfully lose weight, current weight is 269 lbs. Overall his energy is improving. However, he becomes more fatigued as the day progresses. ? ?Since stopping olmesartan, he only had 1 episode of lightheadedness after standing up too quickly. No problems on the current medications. ? ?Patient notes continued improvement in LE edema. ? ?He endorses some shortness of breath which he attributes to seasonal allergies. ? ?He denies any palpitations, chest pain. No headaches, syncope, orthopnea, or PND. ? ? ?Past Medical History:  ?Diagnosis Date  ? Allergy   ? Hx of adenomatous and sessile serrated colonic polyps 02/18/2019  ? Hypertension   ? ? ?Past Surgical History:  ?Procedure Laterality Date  ? HAND SURGERY    ? Growth removal   ? ? ?Current Medications: ?Current Outpatient Medications on File Prior to Visit  ?Medication Sig  ? Aspirin-Acetaminophen-Caffeine (EXCEDRIN MIGRAINE PO) Take by mouth as needed. Reported on 07/05/2015  ? carvedilol (COREG) 6.25 MG tablet TAKE 1 TABLET(6.25 MG) BY MOUTH  TWICE DAILY WITH A MEAL  ? cetirizine (ZYRTEC) 10 MG tablet TAKE 1 TABLET(10 MG) BY MOUTH AT BEDTIME  ? cyclobenzaprine (FLEXERIL) 5 MG tablet TAKE 1 TABLET(5 MG) BY MOUTH THREE TIMES DAILY AS NEEDED FOR MUSCLE SPASMS  ? furosemide (LASIX) 40 MG tablet Take 2 tablets (80 mg total) by mouth daily.  ? pantoprazole (PROTONIX) 20 MG tablet TAKE 1 TABLET(20 MG) BY MOUTH DAILY BEFORE SUPPER  ? potassium chloride SA (KLOR-CON M) 20 MEQ tablet Take 2 tablets (40 mEq total) by mouth 2 (two) times daily.  ? Semaglutide-Weight Management (WEGOVY) 2.4 MG/0.75ML SOAJ Inject 2.4 mg into the skin once a week.  ? spironolactone (ALDACTONE) 25 MG tablet Take 1 tablet (25 mg total) by mouth daily.  ? ?Current Facility-Administered Medications on File Prior to Visit  ?Medication  ? 0.9 %  sodium chloride infusion  ?  ? ?Allergies:   Banana  ? ?Social History  ? ?Tobacco Use  ? Smoking status: Never  ? Smokeless tobacco: Never  ?Vaping Use  ? Vaping Use: Never used  ?Substance Use Topics  ? Alcohol use: Not Currently  ?  Comment: occ  ? Drug use: Never  ? ? ?Family History: ?family history includes Heart disease in his maternal grandmother, mother, and paternal grandmother; Hypertension in his mother; Lung disease in his maternal grandfather; Lupus in his father; Stroke in his father and paternal grandfather. There is no history of Colon polyps, Esophageal cancer, Rectal cancer, or Stomach cancer. ? ?ROS:   ?  Please see the history of present illness. ?All other systems are reviewed and negative.  ? ?EKGs/Labs/Other Studies Reviewed:   ? ?The following studies were reviewed today: ? ?Echo 08/15/2019: ? 1. Left ventricular ejection fraction, by estimation, is 60 to 65%. The  ?left ventricle has normal function. The left ventricle has no regional  ?wall motion abnormalities. Left ventricular diastolic parameters were  ?normal.  ? 2. Right ventricular systolic function is normal. The right ventricular  ?size is normal. There is normal  pulmonary artery systolic pressure.  ? 3. The mitral valve is normal in structure. No evidence of mitral valve  ?regurgitation. No evidence of mitral stenosis.  ? 4. The aortic valve is normal in structure. Aortic valve regurgitation is  ?not visualized. No aortic stenosis is present.  ? 5. The inferior vena cava is normal in size with greater than 50%  ?respiratory variability, suggesting right atrial pressure of 3 mmHg. ? ?LE Venous Reflux 01/10/2018: ?Summary:  ?Right: No reflux was noted in the femoral vein in the thigh, popliteal  ?vein, great saphenous vein at the saphenofemoral junction, great saphenous vein at the proximal thigh, great saphenous vein at the mid thigh, great saphenous vein at the distal thigh,  great saphenous vein at the knee, origin of the small saphenous vein, proximal small saphenous vein, and mid small saphenous vein. Abnormal reflux times were noted in the common femoral vein. Evidence of chronic venous insufficiency is detected in the deep venous system. There is no evidence of deep vein thrombosis in the lower extremity. There is no evidence of superficial venous thrombosis. No cystic structure found in the popliteal fossa.  ?Left: No reflux was noted in the femoral vein in the thigh, popliteal  ?vein, great saphenous vein at the saphenofemoral junction, great saphenous vein at the proximal thigh, great saphenous vein at the mid thigh, great saphenous vein at the distal thigh,  ?great saphenous vein at the knee, origin of the small saphenous vein, proximal small saphenous vein, and mid small saphenous vein. Abnormal reflux times were noted in the common femoral vein. Evidence of chronic venous insufficiency is detected in the  ?deep venous system. There is no evidence of deep vein thrombosis in the lower extremity. There is no evidence of superficial venous thrombosis. No cystic structure found in the popliteal fossa. ? ?EKG: EKG was personally reviewed ?06/15/2021:  EKG was not ordered  today  ?11/26/2020: NSR at 84 bpm ?07/24/2019: NSR at 77 bpm ? ?Recent Labs: ?11/28/2020: ALT 65 ?02/10/2021: BUN 14; Creatinine, Ser 1.25; Potassium 4.5; Sodium 142  ?Recent Lipid Panel ?   ?Component Value Date/Time  ? CHOL 184 11/28/2020 0707  ? TRIG 159 (H) 11/28/2020 5885  ? HDL 33 (L) 11/28/2020 0707  ? CHOLHDL 5.6 (H) 11/28/2020 0277  ? CHOLHDL 5.7 01/25/2012 1107  ? VLDL 26 01/25/2012 1107  ? [place] 122 (H) 11/28/2020 4128  ? ? ?Physical Exam:   ? ?VS:  BP 127/72   Pulse 78   Ht '5\' 6"'  (1.676 m)   Wt 272 lb 3.2 oz (123.5 kg)   SpO2 98%   BMI 43.93 kg/m?    ? ?Wt Readings from Last 3 Encounters:  ?06/15/21 272 lb 3.2 oz (123.5 kg)  ?05/18/21 279 lb 8 oz (126.8 kg)  ?04/29/21 277 lb (125.6 kg)  ?  ?GEN: Well nourished, well developed in no acute distress ?HEENT: Normal, moist mucous membranes ?NECK: No JVD ?CARDIAC: regular rhythm, normal S1 and S2, no rubs or gallops.  No murmur. ?VASCULAR: Radial and DP pulses 2+ bilaterally. No carotid bruits ?RESPIRATORY:  Clear to auscultation without rales, wheezing or rhonchi  ?ABDOMEN: Soft, non-tender, non-distended ?MUSCULOSKELETAL:  Ambulates independently ?SKIN: Warm and dry, no pitting edema. Improved appearance of chronic venous stasis changes ?NEUROLOGIC:  Alert and oriented x 3. No focal neuro deficits noted. ?PSYCHIATRIC:  Normal affect  ? ?ASSESSMENT:   ? ?1. Essential hypertension   ?2. Chronic venous insufficiency   ?3. Morbid obesity ([place])   ?4. Diabetes mellitus type 2 in obese [facility])   ?5. Counseling on health promotion and disease prevention   ? ? ?PLAN:   ? ?Lower extremity edema ?-much improved with weight loss, doing well on lasix and spironolactone ?-echo unremarkable ?-chronic venous stasis changes appear improved today ?-reviewed salt avoidance, compression, elevation.  ? ?Hypertension:  ?-have stopped olmesartan, with stabilization of his BP and fewer symptoms ?-continue carvedilol 6.25 mg BID, lasix 80 mg in the AM, spironolactone 25 mg in the  PM ?-as he continues to lose weight, may need to continue cutting back on antihypertensives ?-he will contact me with any new or worsening symptoms ? ?Morbid obesity, BMI 48-->44, 299 lbs -> 269 lbs on home sca

## 2021-06-22 NOTE — Progress Notes (Unsigned)
Patient ID: Philip Conley                 DOB: 1970-09-10                    MRN: [id number] ? ? ? ?HPI: ?Philip Conley is a 51 y.o. male patient referred to pharmacy clinic by Dr. Harrell Gave to initiate weight loss therapy with GLP1-RA. PMH is significant for obesity complicated by chronic medical conditions including HTN, HLD, newly diagnosed DM. Most recent BMI 48.44, 49.52 today. He started on Saxenda due to shortage problems with [facility] and has now switched over and titrated up to [facility] 2.4 mg weekly.  ? ?Today, patient reports he has taken Wegovy 2.4 mg for 3 weeks now. He endorses some nausea that comes and goes. Some days he doesn't have any. He finds that he is less nauseous when he is more active. He is trying to figure out if certain foods make him feel more nauseous than others. He states that the nausea is tolerable. He has now lost 23 lbs (7.5% of his body weight) after three months of treatment with GLP1 agonist and his weight today includes him wearing his steel toe boots.  ? ?*** ?-update weight, BP ?-nausea improved? ?-has been able to stop olmesartan due to hypotension as he has lost weight ?-A1c, lipid panel today (add direct LDL) ?-copay card ? ?Current weight management medications: Wegovy 2.4 mg weekly ? ?Previously tried meds: none ? ?Current meds that may affect weight: none ? ?Baseline weight/BMI: 139.2 kg (BMI 49.52) ?Current weight/BMI: *** kg (BMI ***) ? ?Insurance payor: BCBS/commercial ? ?Diet: Eating about 1/3 of what he ate at baseline. Stops eating when he feels full.  ? ?Exercise: Active walking dogs, in the yard ? ?Family History: Heart disease in his maternal grandmother, mother, and paternal grandmother; Hypertension in his mother; Lung disease in his maternal grandfather; Lupus in his father; Stroke in his father and paternal grandfather. ? ?Social History: Never smoker ? ?Labs: ?Lab Results  ?Component Value Date  ? HGBA1C 7.0 (H) 11/28/2020  ? ? ?Wt Readings from Last 1  Encounters:  ?06/15/21 272 lb 3.2 oz (123.5 kg)  ? ? ?BP Readings from Last 1 Encounters:  ?06/15/21 127/72  ? ?Pulse Readings from Last 1 Encounters:  ?06/15/21 78  ? ? ?   ?Component Value Date/Time  ? CHOL 184 11/28/2020 0707  ? TRIG 159 (H) 11/28/2020 6433  ? HDL 33 (L) 11/28/2020 0707  ? CHOLHDL 5.6 (H) 11/28/2020 2951  ? CHOLHDL 5.7 01/25/2012 1107  ? VLDL 26 01/25/2012 1107  ? [place] 122 (H) 11/28/2020 8841  ? ? ?Past Medical History:  ?Diagnosis Date  ? Allergy   ? Hx of adenomatous and sessile serrated colonic polyps 02/18/2019  ? Hypertension   ? ? ?Current Outpatient Medications on File Prior to Visit  ?Medication Sig Dispense Refill  ? Aspirin-Acetaminophen-Caffeine (EXCEDRIN MIGRAINE PO) Take by mouth as needed. Reported on 07/05/2015    ? carvedilol (COREG) 6.25 MG tablet TAKE 1 TABLET(6.25 MG) BY MOUTH TWICE DAILY WITH A MEAL 180 tablet 3  ? cetirizine (ZYRTEC) 10 MG tablet TAKE 1 TABLET(10 MG) BY MOUTH AT BEDTIME 90 tablet 3  ? cyclobenzaprine (FLEXERIL) 5 MG tablet TAKE 1 TABLET(5 MG) BY MOUTH THREE TIMES DAILY AS NEEDED FOR MUSCLE SPASMS 15 tablet 0  ? furosemide (LASIX) 40 MG tablet Take 2 tablets (80 mg total) by mouth daily. 180 tablet 3  ? pantoprazole (PROTONIX) 20  MG tablet TAKE 1 TABLET(20 MG) BY MOUTH DAILY BEFORE SUPPER 90 tablet 3  ? potassium chloride SA (KLOR-CON M) 20 MEQ tablet Take 2 tablets (40 mEq total) by mouth 2 (two) times daily. 360 tablet 3  ? Semaglutide-Weight Management (WEGOVY) 2.4 MG/0.75ML SOAJ Inject 2.4 mg into the skin once a week. 3 mL 11  ? spironolactone (ALDACTONE) 25 MG tablet Take 1 tablet (25 mg total) by mouth daily. 90 tablet 3  ? ?Current Facility-Administered Medications on File Prior to Visit  ?Medication Dose Route Frequency Provider Last Rate Last Admin  ? 0.9 %  sodium chloride infusion  500 mL Intravenous Once Gatha Mayer, MD      ? ? ?Allergies  ?Allergen Reactions  ? Banana Other (See Comments)  ?  Constriction in mouth/throat. tongue swelling.    ? ? ?Assessment/Plan: ? ?1. Weight loss - Patient has lost *** 7.5% of his body weight since starting a GLP-1 agonist for weight loss 3 months ago. He started on Saxenda due to supply chain shortages with [facility], and he has now been on the max dose of Wegovy for three weeks so we anticipate his weight loss to continue over the next few months. Also anticipate that nausea will continue to improve over time. Continue Wegovy 2.4 mg weekly. Encouraged him to keep a log of what he eats in a day and track when he experiences nausea so he can determine which foods may be contributing more than others. Discussed that if the nausea does not continue to improve over time we can consider decreasing the dose. Counseled him to call if nausea becomes intolerable prior to his next visit which is scheduled in April. Will check 6 month follow up labs at that time (A1c, lipid panel).  ? ?Follow up as needed.  ? ?Rebbeca Paul, PharmD ?PGY2 Ambulatory Care Pharmacy Resident ?06/22/2021 10:11 AM ? ?

## 2021-06-23 ENCOUNTER — Ambulatory Visit: Payer: No Typology Code available for payment source

## 2021-07-17 ENCOUNTER — Other Ambulatory Visit: Payer: Self-pay | Admitting: Family Medicine

## 2021-07-17 DIAGNOSIS — J309 Allergic rhinitis, unspecified: Secondary | ICD-10-CM

## 2021-07-17 DIAGNOSIS — K219 Gastro-esophageal reflux disease without esophagitis: Secondary | ICD-10-CM

## 2021-07-25 ENCOUNTER — Other Ambulatory Visit: Payer: Self-pay | Admitting: Family Medicine

## 2021-07-25 DIAGNOSIS — J309 Allergic rhinitis, unspecified: Secondary | ICD-10-CM

## 2021-07-25 DIAGNOSIS — K219 Gastro-esophageal reflux disease without esophagitis: Secondary | ICD-10-CM

## 2021-10-16 IMAGING — DX DG CERVICAL SPINE COMPLETE 4+V
6 series · 6 of 6 positions shown · non-contrast
Comparison: 06/21/2009

CLINICAL DATA: Back and neck pain.

EXAM:
CERVICAL SPINE - COMPLETE 4+ VIEW

[c-spine lat]
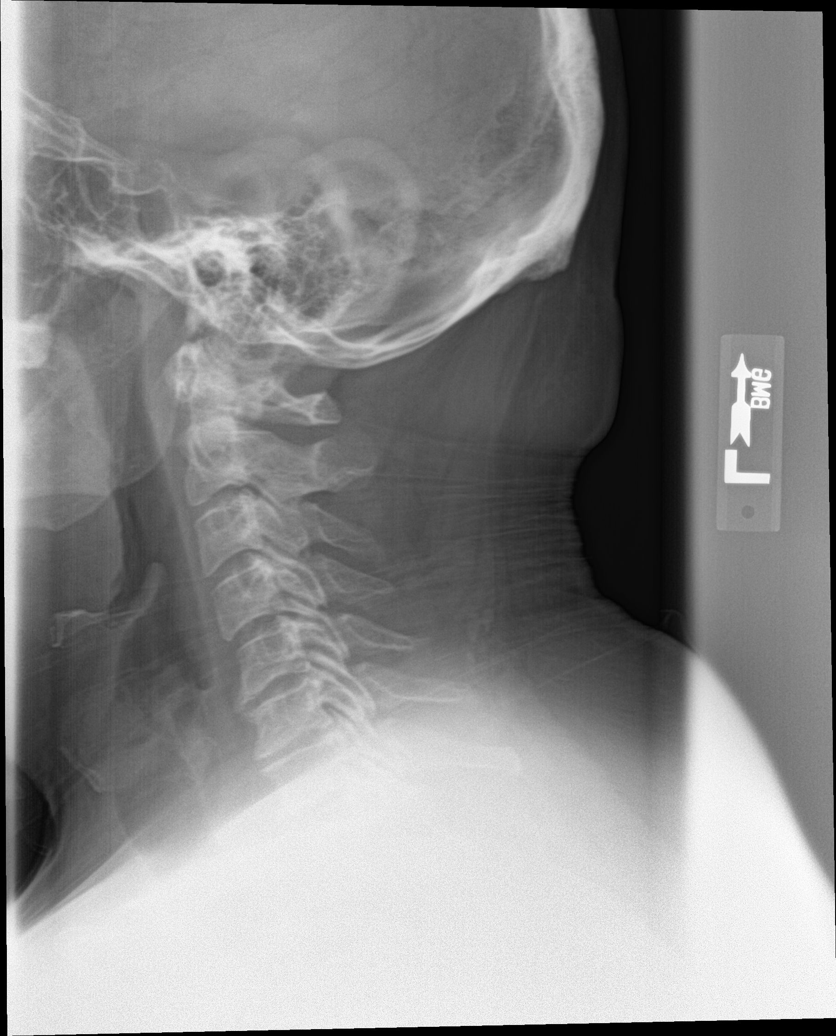

[c-spine obl (1 of 2)]
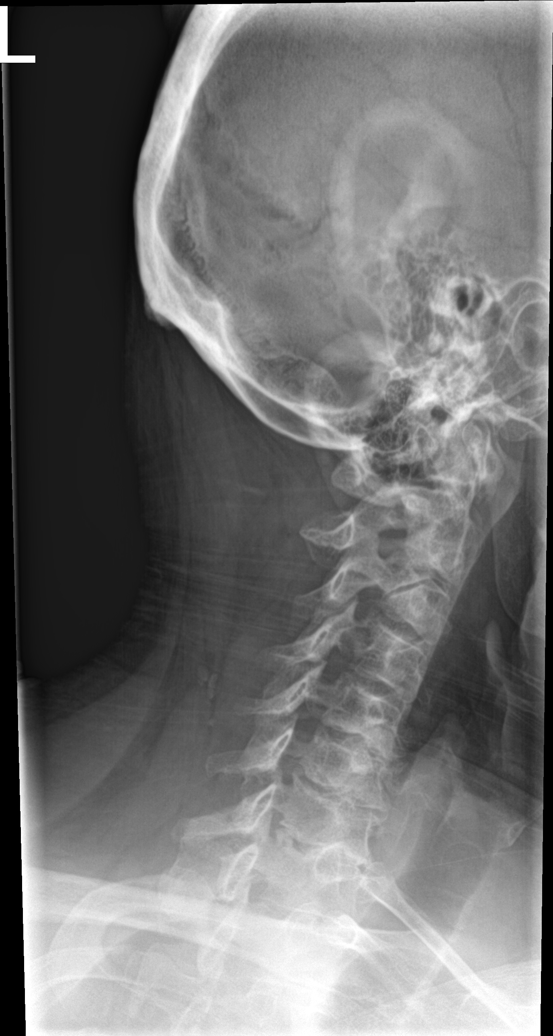

[c-spine obl (2 of 2)]
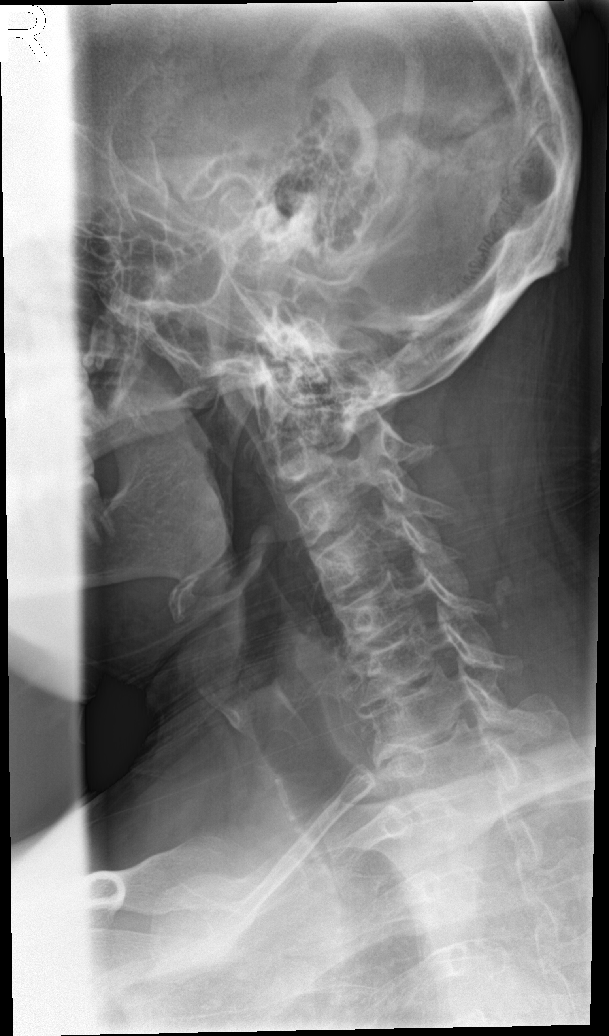

[c-spine ap]
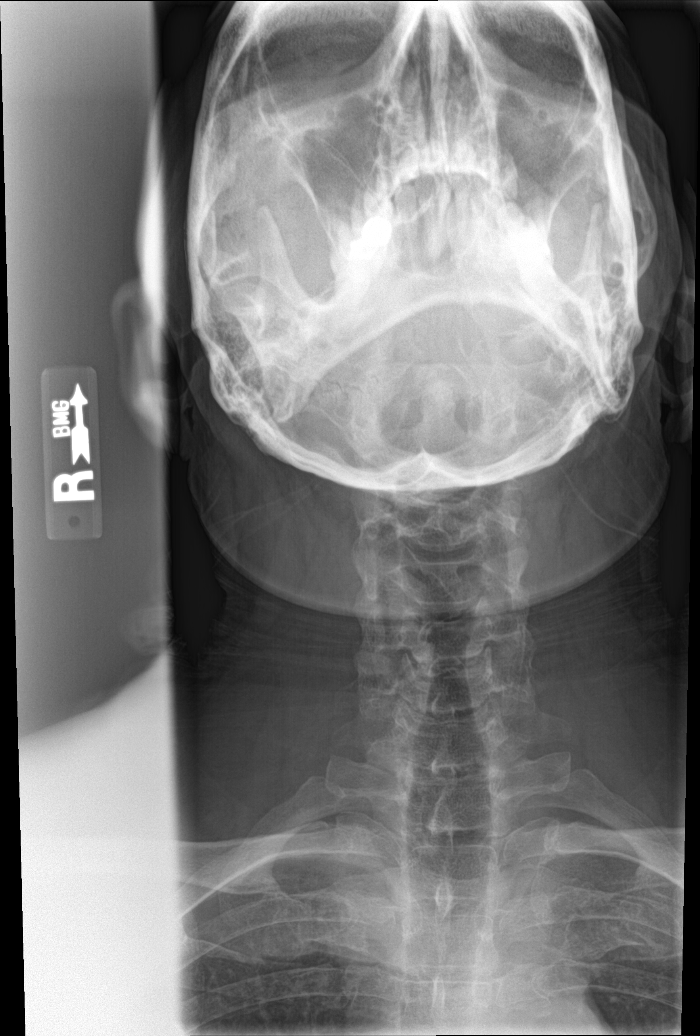

[c-spine open mouth]
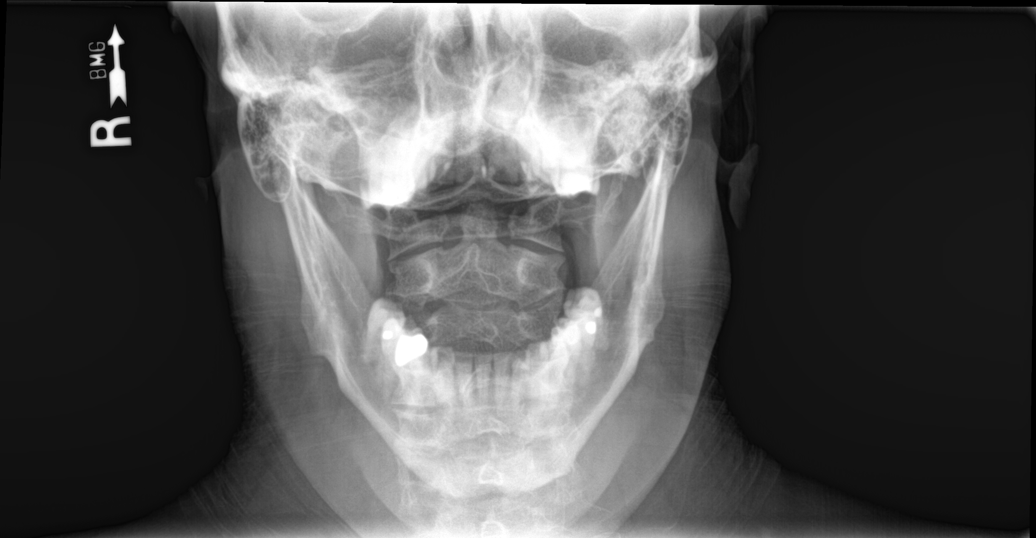

[c-spine swimmers]
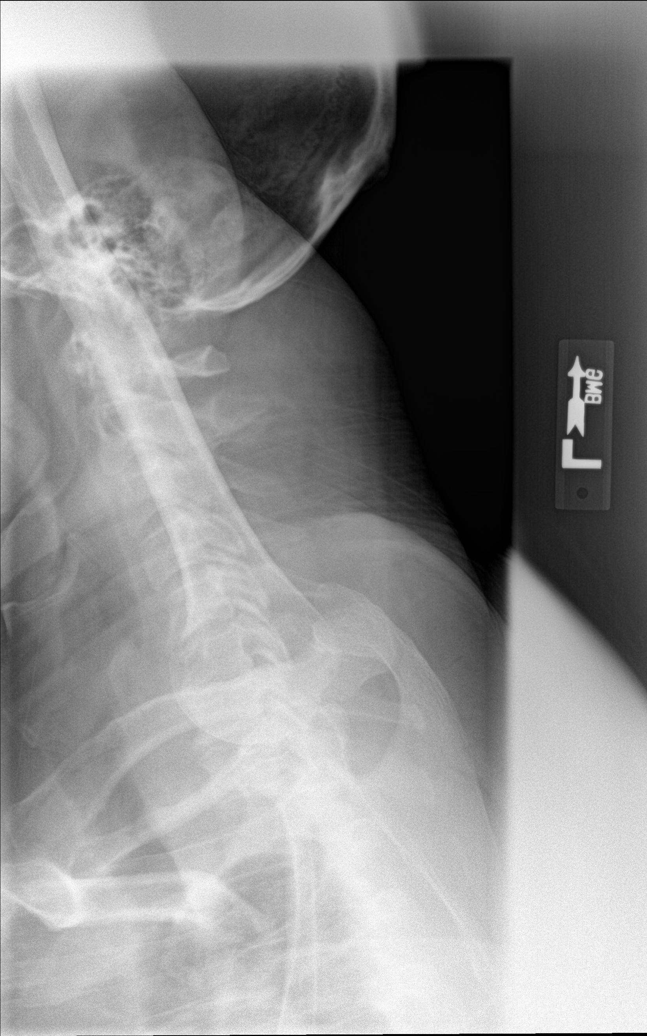

[6 of 6 positions shown; findings below may reference images not displayed]

FINDINGS: The alignment of the cervical spine appears normal. There is no
fracture or dislocation identified. Disc space narrowing and
endplate spurring noted at C5-6 and C6-7. Narrowing of the left C6-7
and C7-T1 neural foramina noted. On the right there is mild
narrowing of the C 7 T1 neural foramina
IMPRESSION: 1. No acute findings.
2. Cervical spondylosis with bilateral neural foraminal narrowing.

## 2021-10-22 ENCOUNTER — Other Ambulatory Visit: Payer: Self-pay

## 2021-10-26 ENCOUNTER — Telehealth: Payer: Self-pay | Admitting: Pharmacist

## 2021-10-26 NOTE — Telephone Encounter (Signed)
Received fax from pharmacy that updated prior auth for [employer]ons is required. This has been submitted, key [place].

## 2021-10-28 NOTE — Telephone Encounter (Signed)
His insurance has not responded yet with a determination. Will call pt once they do.

## 2021-10-28 NOTE — Telephone Encounter (Signed)
Patient is following up regarding [facility] PA. I informed him, per previous message, PA has been submitted. Patient would like a call back when we receive an update if possible.

## 2021-11-02 NOTE — Telephone Encounter (Addendum)
PA still pending, awaiting response from his insurance with determination. They advised a decision should be made by tomorrow morning at the latest.

## 2021-11-03 NOTE — Telephone Encounter (Addendum)
Still haven't received an update from insurance. Called again, they stated it was still under clinical review. Advised them to mark request as urgent, renewal request for continuation of therapy should not take 8 days and pt has been out of medication.

## 2021-11-04 ENCOUNTER — Other Ambulatory Visit: Payer: Self-pay | Admitting: Family Medicine

## 2021-11-04 DIAGNOSIS — K219 Gastro-esophageal reflux disease without esophagitis: Secondary | ICD-10-CM

## 2021-11-04 DIAGNOSIS — J309 Allergic rhinitis, unspecified: Secondary | ICD-10-CM

## 2021-11-04 MED ORDER — WEGOVY 2.4 MG/0.75ML ~~LOC~~ SOAJ: 2.4000 mg | SUBCUTANEOUS | 11 refills | Status: DC

## 2021-11-04 NOTE — Telephone Encounter (Signed)
Called insurance again since I still have not received a call back. Was advised that another message was sent to both clinical team and supervisor to call back.

## 2021-11-04 NOTE — Telephone Encounter (Signed)
Representative called clinic back. They have approved Wegovy through the end of February 2024. Pt aware and rx has been sent in.

## 2021-11-04 NOTE — Addendum Note (Signed)
Addended by: Antwan Pandya E on: 11/04/2021 03:25 PM   Modules accepted: Orders

## 2021-11-04 NOTE — Telephone Encounter (Addendum)
Prior authorization denied because insurance is using the wrong baseline weight. Pt had a change in insurance plans and was initially approved for [facility] in last year with [employer]. Now has a [employer] plan and they are saying his baseline weight prior to starting [facility] is 277 lbs which is incorrect. He had already been on therapy for months at that point, his baseline weight was 306 lbs which was the documentation submitted with the request. He has dropped from 306 lbs to 272 lbs, which is equivalent to 11% of his body weight. Insurance is claiming incorrectly that his weight dropped from 277 to 271 lbs which is not a > 5% weight loss. Advised them again that they are using a weight from February 2023 which is not his baseline weight as he was started on GLP back in October 2022.  Representative stated she would have her manager call me back before 3pm today.

## 2021-11-20 ENCOUNTER — Other Ambulatory Visit: Payer: Self-pay | Admitting: Family Medicine

## 2021-11-20 DIAGNOSIS — I872 Venous insufficiency (chronic) (peripheral): Secondary | ICD-10-CM

## 2021-11-20 DIAGNOSIS — I10 Essential (primary) hypertension: Secondary | ICD-10-CM

## 2021-11-26 ENCOUNTER — Telehealth: Payer: Self-pay | Admitting: Cardiology

## 2021-11-26 NOTE — Telephone Encounter (Signed)
Patient needs prior auth

## 2021-11-26 NOTE — Telephone Encounter (Signed)
Patient called stating he needs a prior auth for Semaglutide-Weight Management (WEGOVY) 2.4 MG/0.75ML SOAJ.

## 2021-11-27 NOTE — Telephone Encounter (Signed)
Pt advised per chart review, wegovy approved until Feb, 2024.  Pt verbalized understanding

## 2022-02-08 ENCOUNTER — Other Ambulatory Visit: Payer: Self-pay | Admitting: Family Medicine

## 2022-02-08 DIAGNOSIS — J309 Allergic rhinitis, unspecified: Secondary | ICD-10-CM

## 2022-02-11 ENCOUNTER — Encounter: Payer: Self-pay | Admitting: Internal Medicine

## 2022-02-19 ENCOUNTER — Other Ambulatory Visit: Payer: Self-pay | Admitting: Family Medicine

## 2022-02-19 DIAGNOSIS — K219 Gastro-esophageal reflux disease without esophagitis: Secondary | ICD-10-CM

## 2022-03-10 NOTE — Progress Notes (Signed)
Cardiology Office Note:    Date:  03/18/2022   ID:  Philip Conley, DOB 24-Nov-1970, MRN [id number]  PCP:  Wendie Agreste, MD  Cardiologist:  Buford Dresser, MD  Referring MD: Wendie Agreste, MD   CC: follow-up  History of Present Illness:    Philip Conley is a 51 y.o. male with a hx of hypertension, LE edema who is seen for follow-up. I initially met him 07/24/2019 as a new consult at the request of Wendie Agreste, MD for the evaluation and management of hypertension with edema.  He presented for a nurse visit 05/18/2021 and his blood pressure was 103/72. Olmesartan was stopped.  At his last appointment he believed his home BP cuff was inaccurate. The day prior his BP was high at 140/104, but improved to 122/81 in the evening. Since stopping olmesartan, he only had 1 episode of lightheadedness after standing up too quickly.  He continued to work on weight loss. Planned to discuss aspirin and statin therapy at follow-up; he preferred to keep working on lifestyle changes.  Today, he states he is feeling well overall. His blood pressure is controlled at 114/72 in clinic, and he reports good readings at home as well such as 124/80 or a bit lower.   He has noticed some improvement in his LE edema, with some days better than others.  He denies any palpitations, chest pain, or shortness of breath. No lightheadedness, headaches, syncope, orthopnea, or PND.   Past Medical History:  Diagnosis Date   Allergy    Hx of adenomatous and sessile serrated colonic polyps 02/18/2019   Hypertension     Past Surgical History:  Procedure Laterality Date   HAND SURGERY     Growth removal     Current Medications: Current Outpatient Medications on File Prior to Visit  Medication Sig   Aspirin-Acetaminophen-Caffeine (EXCEDRIN MIGRAINE PO) Take by mouth as needed. Reported on 07/05/2015   carvedilol (COREG) 6.25 MG tablet TAKE 1 TABLET(6.25 MG) BY MOUTH TWICE DAILY WITH A MEAL    cyclobenzaprine (FLEXERIL) 5 MG tablet TAKE 1 TABLET(5 MG) BY MOUTH THREE TIMES DAILY AS NEEDED FOR MUSCLE SPASMS   EQ ALLERGY RELIEF, CETIRIZINE, 10 MG tablet TAKE 1 TABLET BY MOUTH ONCE DAILY AT BEDTIME   furosemide (LASIX) 40 MG tablet Take 2 tablets (80 mg total) by mouth daily.   pantoprazole (PROTONIX) 20 MG tablet TAKE 1 TABLET BY MOUTH ONCE DAILY BEFORE SUPPER   potassium chloride SA (KLOR-CON M) 20 MEQ tablet Take 2 tablets (40 mEq total) by mouth 2 (two) times daily.   Semaglutide-Weight Management (WEGOVY) 2.4 MG/0.75ML SOAJ Inject 2.4 mg into the skin once a week.   spironolactone (ALDACTONE) 25 MG tablet Take 1 tablet (25 mg total) by mouth daily.   Current Facility-Administered Medications on File Prior to Visit  Medication   0.9 %  sodium chloride infusion     Allergies:   Banana   Social History   Tobacco Use   Smoking status: Never   Smokeless tobacco: Never  Vaping Use   Vaping Use: Never used  Substance Use Topics   Alcohol use: Not Currently    Comment: occ   Drug use: Never    Family History: family history includes Heart disease in his maternal grandmother, mother, and paternal grandmother; Hypertension in his mother; Lung disease in his maternal grandfather; Lupus in his father; Stroke in his father and paternal grandfather. There is no history of Colon polyps, Esophageal cancer, Rectal cancer, or  Stomach cancer.  ROS:   Please see the history of present illness. (+) BLE edema (+) Fatigue All other systems are reviewed and negative.   EKGs/Labs/Other Studies Reviewed:    The following studies were reviewed today:  Echo 08/15/2019:  1. Left ventricular ejection fraction, by estimation, is 60 to 65%. The  left ventricle has normal function. The left ventricle has no regional  wall motion abnormalities. Left ventricular diastolic parameters were  normal.   2. Right ventricular systolic function is normal. The right ventricular  size is normal. There is  normal pulmonary artery systolic pressure.   3. The mitral valve is normal in structure. No evidence of mitral valve  regurgitation. No evidence of mitral stenosis.   4. The aortic valve is normal in structure. Aortic valve regurgitation is  not visualized. No aortic stenosis is present.   5. The inferior vena cava is normal in size with greater than 50%  respiratory variability, suggesting right atrial pressure of 3 mmHg.  LE Venous Reflux 01/10/2018: Summary:  Right: No reflux was noted in the femoral vein in the thigh, popliteal  vein, great saphenous vein at the saphenofemoral junction, great saphenous vein at the proximal thigh, great saphenous vein at the mid thigh, great saphenous vein at the distal thigh,  great saphenous vein at the knee, origin of the small saphenous vein, proximal small saphenous vein, and mid small saphenous vein. Abnormal reflux times were noted in the common femoral vein. Evidence of chronic venous insufficiency is detected in the deep venous system. There is no evidence of deep vein thrombosis in the lower extremity. There is no evidence of superficial venous thrombosis. No cystic structure found in the popliteal fossa.  Left: No reflux was noted in the femoral vein in the thigh, popliteal  vein, great saphenous vein at the saphenofemoral junction, great saphenous vein at the proximal thigh, great saphenous vein at the mid thigh, great saphenous vein at the distal thigh,  great saphenous vein at the knee, origin of the small saphenous vein, proximal small saphenous vein, and mid small saphenous vein. Abnormal reflux times were noted in the common femoral vein. Evidence of chronic venous insufficiency is detected in the  deep venous system. There is no evidence of deep vein thrombosis in the lower extremity. There is no evidence of superficial venous thrombosis. No cystic structure found in the popliteal fossa.  EKG: EKG was personally reviewed 03/18/2022:  NSR at 79  bpm 06/15/2021:  EKG was not ordered. 11/26/2020: NSR at 84 bpm 07/24/2019: NSR at 77 bpm  Recent Labs: No results found for requested labs within last 365 days.   Recent Lipid Panel    Component Value Date/Time   CHOL 184 11/28/2020 0707   TRIG 159 (H) 11/28/2020 0707   HDL 33 (L) 11/28/2020 0707   CHOLHDL 5.6 (H) 11/28/2020 0707   CHOLHDL 5.7 01/25/2012 1107   VLDL 26 01/25/2012 1107   LDLCALC 122 (H) 11/28/2020 0707    Physical Exam:    VS:  BP 114/72 (BP Location: Left Arm, Patient Position: Sitting, Cuff Size: Large)   Pulse 79   Ht 5' 6"$  (1.676 m)   Wt 285 lb 3.2 oz (129.4 kg)   BMI 46.03 kg/m     Wt Readings from Last 3 Encounters:  03/18/22 285 lb 3.2 oz (129.4 kg)  06/15/21 272 lb 3.2 oz (123.5 kg)  05/18/21 279 lb 8 oz (126.8 kg)    GEN: Well nourished, well developed in no  acute distress HEENT: Normal, moist mucous membranes NECK: No JVD CARDIAC: regular rhythm, normal S1 and S2, no rubs or gallops. No murmur. VASCULAR: Radial and DP pulses 2+ bilaterally. No carotid bruits RESPIRATORY:  Clear to auscultation without rales, wheezing or rhonchi  ABDOMEN: Soft, non-tender, non-distended MUSCULOSKELETAL:  Ambulates independently SKIN: Warm and dry, no pitting edema. Improved appearance of chronic venous stasis changes NEUROLOGIC:  Alert and oriented x 3. No focal neuro deficits noted. PSYCHIATRIC:  Normal affect   ASSESSMENT:    1. Diabetes mellitus type 2 in obese ([place])   2. Morbid obesity ([place])   3. Essential hypertension   4. Chronic venous insufficiency   5. BMI 45.0-49.9, adult ([place])   6. Medication management      PLAN:    Lower extremity edema -much improved with weight loss, doing well on lasix and spironolactone -echo unremarkable -chronic venous stasis changes appear improved today -reviewed salt avoidance, compression, elevation.   Hypertension:  -have stopped olmesartan, with stabilization of his BP and fewer symptoms -continue  carvedilol 6.25 mg BID, lasix 80 mg in the AM, spironolactone 25 mg in the PM -as he continues to lose weight, may need to continue cutting back on antihypertensives -he will contact me with any new or worsening symptoms -check bmet  Morbid obesity, BMI 48-->44-->46, 299 lbs -> 269 lbs on home scale Type II diabetes -losing weight on wegovy, ~30 lbs -discuss aspirin, statin at follow up, wants to work on weight loss first -check A1c, lipids  Cardiac risk counseling and prevention recommendations: -recommend heart healthy/Mediterranean diet, with whole grains, fruits, vegetable, fish, lean meats, nuts, and olive oil. Limit salt. -recommend moderate walking, 3-5 times/week for 30-50 minutes each session. Aim for at least 150 minutes.week. Goal should be Conley of 3 miles/hours, or walking 1.5 miles in 30 minutes -recommend avoidance of tobacco products. Avoid excess alcohol.  -ASCVD risk score: The 10-year ASCVD risk score (Arnett DK, et al., 2019) is: 11.1%   Values used to calculate the score:     Age: 72 years     Sex: Male     Is Non-Hispanic African American: No     Diabetic: Yes     Tobacco smoker: No     Systolic Blood Pressure: 99991111 mmHg     Is BP treated: Yes     HDL Cholesterol: 33 mg/dL     Total Cholesterol: 184 mg/dL    Plan for follow up: 3 months or sooner as needed  Buford Dresser, MD, PhD [place]  [facility]    Medication Adjustments/Labs and Tests Ordered: Current medicines are reviewed at length with the patient today.  Concerns regarding medicines are outlined above.   Orders Placed This Encounter  Procedures   Basic Metabolic Panel (BMET)   Lipid Panel With LDL/HDL Ratio   HgB A1c   EKG 12-Lead   No orders of the defined types were placed in this encounter.  Patient Instructions  Medication Instructions:  Your physician recommends that you continue on your current medications as directed. Please refer to the Current Medication list given  to you today.   *If you need a refill on your cardiac medications before your next appointment, please call your pharmacy*  Lab Work: FASTING LP/BMET/A1C SOON   If you have labs (blood work) drawn today and your tests are completely normal, you will receive your results only by: [place] (if you have MyChart) OR A paper copy in the mail If you have any  lab test that is abnormal or we need to change your treatment, we will call you to review the results.  Testing/Procedures: NONE  Follow-Up: At [facility], you and your health needs are our priority.  As part of our continuing mission to provide you with exceptional heart care, we have created designated Provider Care Teams.  These Care Teams include your primary Cardiologist (physician) and Advanced Practice Providers (APPs -  Physician Assistants and Nurse Practitioners) who all work together to provide you with the care you need, when you need it.  We recommend signing up for the patient portal called "MyChart".  Sign up information is provided on this After Visit Summary.  MyChart is used to connect with patients for Virtual Visits (Telemedicine).  Patients are able to view lab/test results, encounter notes, upcoming appointments, etc.  Non-urgent messages can be sent to your provider as well.   To learn more about what you can do with MyChart, go to [email or web address].    Your next appointment:   3 month(s)  The format for your next appointment:   In Person  Provider:   DR Randall An Stumpf,acting as a scribe for Buford Dresser, MD.,have documented all relevant documentation on the behalf of Buford Dresser, MD,as directed by  Buford Dresser, MD while in the presence of Buford Dresser, MD.   I, Buford Dresser, MD, have reviewed all documentation for this visit. The documentation on 03/18/22 for the exam, diagnosis, procedures, and orders are all accurate  and complete.   Signed, Buford Dresser, MD PhD 03/18/2022  [place]

## 2022-03-18 ENCOUNTER — Encounter (HOSPITAL_BASED_OUTPATIENT_CLINIC_OR_DEPARTMENT_OTHER): Payer: Self-pay | Admitting: Cardiology

## 2022-03-18 ENCOUNTER — Ambulatory Visit (INDEPENDENT_AMBULATORY_CARE_PROVIDER_SITE_OTHER): Payer: No Typology Code available for payment source | Admitting: Cardiology

## 2022-03-18 VITALS — BP 114/72 | HR 79 | Ht 66.0 in | Wt 285.2 lb

## 2022-03-18 DIAGNOSIS — I10 Essential (primary) hypertension: Secondary | ICD-10-CM

## 2022-03-18 DIAGNOSIS — E1169 Type 2 diabetes mellitus with other specified complication: Secondary | ICD-10-CM

## 2022-03-18 DIAGNOSIS — E669 Obesity, unspecified: Secondary | ICD-10-CM

## 2022-03-18 DIAGNOSIS — Z6841 Body Mass Index (BMI) 40.0 and over, adult: Secondary | ICD-10-CM

## 2022-03-18 DIAGNOSIS — I872 Venous insufficiency (chronic) (peripheral): Secondary | ICD-10-CM | POA: Diagnosis not present

## 2022-03-18 DIAGNOSIS — Z79899 Other long term (current) drug therapy: Secondary | ICD-10-CM

## 2022-03-18 NOTE — Patient Instructions (Addendum)
Medication Instructions:  Your physician recommends that you continue on your current medications as directed. Please refer to the Current Medication list given to you today.   *If you need a refill on your cardiac medications before your next appointment, please call your pharmacy*  Lab Work: FASTING LP/BMET/A1C SOON   If you have labs (blood work) drawn today and your tests are completely normal, you will receive your results only by: [place] (if you have MyChart) OR A paper copy in the mail If you have any lab test that is abnormal or we need to change your treatment, we will call you to review the results.  Testing/Procedures: NONE  Follow-Up: At [facility], you and your health needs are our priority.  As part of our continuing mission to provide you with exceptional heart care, we have created designated Provider Care Teams.  These Care Teams include your primary Cardiologist (physician) and Advanced Practice Providers (APPs -  Physician Assistants and Nurse Practitioners) who all work together to provide you with the care you need, when you need it.  We recommend signing up for the patient portal called "MyChart".  Sign up information is provided on this After Visit Summary.  MyChart is used to connect with patients for Virtual Visits (Telemedicine).  Patients are able to view lab/test results, encounter notes, upcoming appointments, etc.  Non-urgent messages can be sent to your provider as well.   To learn more about what you can do with MyChart, go to [email or web address].    Your next appointment:   3 month(s)  The format for your next appointment:   In Person  Provider:   DR Harrell Gave

## 2022-03-22 LAB — BASIC METABOLIC PANEL WITH GFR
BUN/Creatinine Ratio: 14 (ref 9–20)
BUN: 19 mg/dL (ref 6–24)
CO2: 23 mmol/L (ref 20–29)
Calcium: 9.7 mg/dL (ref 8.7–10.2)
Chloride: 99 mmol/L (ref 96–106)
Creatinine, Ser: 1.4 mg/dL — ABNORMAL HIGH (ref 0.76–1.27)
Glucose: 130 mg/dL — ABNORMAL HIGH (ref 70–99)
Potassium: 5.2 mmol/L (ref 3.5–5.2)
Sodium: 139 mmol/L (ref 134–144)
eGFR: 60 mL/min/1.73

## 2022-03-22 LAB — LIPID PANEL WITH LDL/HDL RATIO
Cholesterol, Total: 215 mg/dL — ABNORMAL HIGH (ref 100–199)
HDL: 35 mg/dL — ABNORMAL LOW
LDL Chol Calc (NIH): 152 mg/dL — ABNORMAL HIGH (ref 0–99)
LDL/HDL Ratio: 4.3 ratio — ABNORMAL HIGH (ref 0.0–3.6)
Triglycerides: 152 mg/dL — ABNORMAL HIGH (ref 0–149)
VLDL Cholesterol Cal: 28 mg/dL (ref 5–40)

## 2022-03-22 LAB — HEMOGLOBIN A1C
Est. average glucose Bld gHb Est-mCnc: 148 mg/dL
Hgb A1c MFr Bld: 6.8 % — ABNORMAL HIGH (ref 4.8–5.6)

## 2022-03-24 NOTE — Progress Notes (Signed)
Lab results called to patient who verbalized understanding. Georgana Curio MHA RN CCM

## 2022-05-02 ENCOUNTER — Encounter (HOSPITAL_BASED_OUTPATIENT_CLINIC_OR_DEPARTMENT_OTHER): Payer: Self-pay | Admitting: Cardiology

## 2022-05-03 ENCOUNTER — Other Ambulatory Visit (HOSPITAL_BASED_OUTPATIENT_CLINIC_OR_DEPARTMENT_OTHER): Payer: Self-pay | Admitting: Cardiology

## 2022-05-03 ENCOUNTER — Other Ambulatory Visit: Payer: Self-pay | Admitting: Family Medicine

## 2022-05-03 DIAGNOSIS — I10 Essential (primary) hypertension: Secondary | ICD-10-CM

## 2022-05-03 DIAGNOSIS — J309 Allergic rhinitis, unspecified: Secondary | ICD-10-CM

## 2022-05-03 DIAGNOSIS — K219 Gastro-esophageal reflux disease without esophagitis: Secondary | ICD-10-CM

## 2022-05-03 DIAGNOSIS — I872 Venous insufficiency (chronic) (peripheral): Secondary | ICD-10-CM

## 2022-05-03 NOTE — Telephone Encounter (Signed)
Rx request sent to pharmacy.  

## 2022-05-04 ENCOUNTER — Other Ambulatory Visit: Payer: Self-pay | Admitting: Family Medicine

## 2022-05-04 DIAGNOSIS — K219 Gastro-esophageal reflux disease without esophagitis: Secondary | ICD-10-CM

## 2022-05-05 ENCOUNTER — Other Ambulatory Visit: Payer: Self-pay | Admitting: Family Medicine

## 2022-05-05 DIAGNOSIS — J309 Allergic rhinitis, unspecified: Secondary | ICD-10-CM

## 2022-05-05 DIAGNOSIS — K219 Gastro-esophageal reflux disease without esophagitis: Secondary | ICD-10-CM

## 2022-05-13 ENCOUNTER — Other Ambulatory Visit: Payer: Self-pay | Admitting: Family Medicine

## 2022-05-13 DIAGNOSIS — J309 Allergic rhinitis, unspecified: Secondary | ICD-10-CM

## 2022-06-21 ENCOUNTER — Encounter (HOSPITAL_BASED_OUTPATIENT_CLINIC_OR_DEPARTMENT_OTHER): Payer: Self-pay | Admitting: Cardiology

## 2022-06-21 ENCOUNTER — Ambulatory Visit (INDEPENDENT_AMBULATORY_CARE_PROVIDER_SITE_OTHER): Payer: No Typology Code available for payment source | Admitting: Cardiology

## 2022-06-21 VITALS — BP 127/80 | HR 79 | Ht 66.0 in | Wt 297.7 lb

## 2022-06-21 DIAGNOSIS — Z6841 Body Mass Index (BMI) 40.0 and over, adult: Secondary | ICD-10-CM

## 2022-06-21 DIAGNOSIS — I10 Essential (primary) hypertension: Secondary | ICD-10-CM

## 2022-06-21 DIAGNOSIS — E1169 Type 2 diabetes mellitus with other specified complication: Secondary | ICD-10-CM

## 2022-06-21 DIAGNOSIS — E669 Obesity, unspecified: Secondary | ICD-10-CM

## 2022-06-21 DIAGNOSIS — I872 Venous insufficiency (chronic) (peripheral): Secondary | ICD-10-CM

## 2022-06-21 DIAGNOSIS — Z5181 Encounter for therapeutic drug level monitoring: Secondary | ICD-10-CM

## 2022-06-21 MED ORDER — TIRZEPATIDE 2.5 MG/0.5ML ~~LOC~~ SOAJ: 2.5000 mg | SUBCUTANEOUS | 0 refills | Status: DC

## 2022-06-21 MED ORDER — ROSUVASTATIN CALCIUM 10 MG PO TABS: 10.0000 mg | ORAL_TABLET | Freq: Every day | ORAL | 3 refills | Status: DC

## 2022-06-21 MED ORDER — ASPIRIN 81 MG PO TBEC: 81.0000 mg | DELAYED_RELEASE_TABLET | Freq: Every day | ORAL | 3 refills | Status: AC

## 2022-06-21 NOTE — Progress Notes (Signed)
Cardiology Office Note:    Date:  06/21/2022   ID:  Philip Conley, DOB Oct 21, 1970, MRN [id number]  PCP:  [name], MD  Cardiologist:  Jodelle Red, MD  Referring MD: [name], MD   CC: follow-up  History of Present Illness:    Philip Conley is a 52 y.o. male with a hx of hypertension, LE edema, who is seen for follow-up. I initially met him 07/24/2019 as a new consult at the request of [name], MD for the evaluation and management of hypertension with edema.  He presented for a nurse visit 05/18/2021 and his blood pressure was 103/72. Olmesartan was stopped.  At his last appointment, he was doing well. His blood pressure was controlled in clinic, and he reported good readings at home such as 124/80 or a bit lower. He also noted some improvement in his LE edema, with some days better than others. Chronic venous stasis changes appeared improved. We reviewed salt avoidance, compression, elevation. Continued to work on weight loss.  Today, he continues to struggle with weight loss. He reports that his insurance will not cover Wegovy. He has been off of Wegovy since October; since then he has noticed worsening edema and venous stasis changes. He also notes having more sugar cravings despite no significant changes in his diet. We reviewed his labs with an A1c of 6.8 as of 03/2022.  Additionally he complains of myalgias and muscle cramps throughout his body. At times he has severe cramping in his fingers.  He endorses seasonal allergies. The other day his sneezing resulted in an episode of epistaxis lasting for 30 minutes. His blood pressure at the time was 119/79.  He denies any palpitations, chest pain, shortness of breath, lightheadedness, headaches, syncope, orthopnea, or PND.   Past Medical History:  Diagnosis Date   Allergy    Hx of adenomatous and sessile serrated colonic polyps 02/18/2019   Hypertension     Past Surgical History:  Procedure  Laterality Date   HAND SURGERY     Growth removal     Current Medications: Current Outpatient Medications on File Prior to Visit  Medication Sig   carvedilol (COREG) 6.25 MG tablet TAKE 1 TABLET BY MOUTH TWICE DAILY WITH A MEAL   cyclobenzaprine (FLEXERIL) 5 MG tablet TAKE 1 TABLET(5 MG) BY MOUTH THREE TIMES DAILY AS NEEDED FOR MUSCLE SPASMS   EQ ALLERGY RELIEF, CETIRIZINE, 10 MG tablet TAKE 1 TABLET BY MOUTH ONCE DAILY AT BEDTIME   furosemide (LASIX) 40 MG tablet Take 2 tablets by mouth once daily   pantoprazole (PROTONIX) 20 MG tablet TAKE 1 TABLET BY MOUTH ONCE DAILY BEFORE SUPPER   potassium chloride SA (KLOR-CON M) 20 MEQ tablet Take 2 tablets by mouth twice daily   spironolactone (ALDACTONE) 25 MG tablet Take 1 tablet by mouth once daily   No current facility-administered medications on file prior to visit.     Allergies:   Banana   Social History   Tobacco Use   Smoking status: Never   Smokeless tobacco: Never  Vaping Use   Vaping Use: Never used  Substance Use Topics   Alcohol use: Not Currently    Comment: occ   Drug use: Never    Family History: family history includes Heart disease in his maternal grandmother, mother, and paternal grandmother; Hypertension in his mother; Lung disease in his maternal grandfather; Lupus in his father; Stroke in his father and paternal grandfather. There is no history of Colon polyps, Esophageal cancer,  Rectal cancer, or Stomach cancer.  ROS:   Please see the history of present illness. (+) LE edema (+) Myalgias (+) Muscle cramps (+) Seasonal allergies (+) Epistaxis All other systems are reviewed and negative.   EKGs/Labs/Other Studies Reviewed:    The following studies were reviewed today:  Echo 08/15/2019:  1. Left ventricular ejection fraction, by estimation, is 60 to 65%. The  left ventricle has normal function. The left ventricle has no regional  wall motion abnormalities. Left ventricular diastolic parameters were   normal.   2. Right ventricular systolic function is normal. The right ventricular  size is normal. There is normal pulmonary artery systolic pressure.   3. The mitral valve is normal in structure. No evidence of mitral valve  regurgitation. No evidence of mitral stenosis.   4. The aortic valve is normal in structure. Aortic valve regurgitation is  not visualized. No aortic stenosis is present.   5. The inferior vena cava is normal in size with greater than 50%  respiratory variability, suggesting right atrial pressure of 3 mmHg.  LE Venous Reflux 01/10/2018: Summary:  Right: No reflux was noted in the femoral vein in the thigh, popliteal  vein, great saphenous vein at the saphenofemoral junction, great saphenous vein at the proximal thigh, great saphenous vein at the mid thigh, great saphenous vein at the distal thigh,  great saphenous vein at the knee, origin of the small saphenous vein, proximal small saphenous vein, and mid small saphenous vein. Abnormal reflux times were noted in the common femoral vein. Evidence of chronic venous insufficiency is detected in the deep venous system. There is no evidence of deep vein thrombosis in the lower extremity. There is no evidence of superficial venous thrombosis. No cystic structure found in the popliteal fossa.  Left: No reflux was noted in the femoral vein in the thigh, popliteal  vein, great saphenous vein at the saphenofemoral junction, great saphenous vein at the proximal thigh, great saphenous vein at the mid thigh, great saphenous vein at the distal thigh,  great saphenous vein at the knee, origin of the small saphenous vein, proximal small saphenous vein, and mid small saphenous vein. Abnormal reflux times were noted in the common femoral vein. Evidence of chronic venous insufficiency is detected in the  deep venous system. There is no evidence of deep vein thrombosis in the lower extremity. There is no evidence of superficial venous  thrombosis. No cystic structure found in the popliteal fossa.  EKG: EKG was personally reviewed 06/21/2022:  not ordered today 03/18/2022:  NSR at 79 bpm 06/15/2021:  EKG was not ordered. 11/26/2020: NSR at 84 bpm 07/24/2019: NSR at 77 bpm  Recent Labs: 03/22/2022: BUN 19; Creatinine, Ser 1.40; Potassium 5.2; Sodium 139   Recent Lipid Panel    Component Value Date/Time   CHOL 215 (H) 03/22/2022 0727   TRIG 152 (H) 03/22/2022 0727   HDL 35 (L) 03/22/2022 0727   CHOLHDL 5.6 (H) 11/28/2020 0707   CHOLHDL 5.7 01/25/2012 1107   VLDL 26 01/25/2012 1107   LDLCALC 152 (H) 03/22/2022 0727    Physical Exam:    VS:  BP 127/80 (BP Location: Left Arm, Patient Position: Sitting, Cuff Size: Large)   Pulse 79   Ht 5\' 6"  (1.676 m)   Wt 297 lb 11.2 oz (135 kg)   SpO2 96%   BMI 48.05 kg/m     Wt Readings from Last 3 Encounters:  06/21/22 297 lb 11.2 oz (135 kg)  03/18/22 285 lb 3.2  oz (129.4 kg)  06/15/21 272 lb 3.2 oz (123.5 kg)    GEN: Well nourished, well developed in no acute distress HEENT: Normal, moist mucous membranes NECK: No JVD CARDIAC: regular rhythm, normal S1 and S2, no rubs or gallops. No murmur. VASCULAR: Radial and DP pulses 2+ bilaterally. No carotid bruits RESPIRATORY:  Clear to auscultation without rales, wheezing or rhonchi  ABDOMEN: Soft, non-tender, non-distended MUSCULOSKELETAL:  Ambulates independently SKIN: Warm and dry, no pitting edema. Improved appearance of chronic venous stasis changes NEUROLOGIC:  Alert and oriented x 3. No focal neuro deficits noted. PSYCHIATRIC:  Normal affect   ASSESSMENT:    1. Type 2 diabetes mellitus with obesity   2. BMI 45.0-49.9, adult   3. Morbid obesity   4. Essential hypertension   5. Chronic venous insufficiency   6. Therapeutic drug monitoring     PLAN:    Type II diabetes Morbid obesity, BMI 48 -his A1c is 6.8, which now formally diagnoses him as a diabetic -given his diabetes and comorbidities, GLP1 is a good answer  for him. Tolerated Wegovy prior to diabetes diagnosis, but none since 12/2021 -will start mounjaro, uptitrate monthly as tolerated -start aspirin, statin, discussed today  Lower extremity edema -much improved with weight loss, doing well on lasix and spironolactone -echo unremarkable -chronic venous stasis changes appear improved today -reviewed salt avoidance, compression, elevation.   Hypertension:  -have stopped olmesartan, with stabilization of his BP and fewer symptoms -continue carvedilol 6.25 mg BID, lasix 80 mg in the AM, spironolactone 25 mg in the PM -as he continues to lose weight, may need to continue cutting back on antihypertensives -he will contact me with any new or worsening symptoms  Cardiac risk counseling and prevention recommendations: -recommend heart healthy/Mediterranean diet, with whole grains, fruits, vegetable, fish, lean meats, nuts, and olive oil. Limit salt. -recommend moderate walking, 3-5 times/week for 30-50 minutes each session. Aim for at least 150 minutes.week. Goal should be pace of 3 miles/hours, or walking 1.5 miles in 30 minutes -recommend avoidance of tobacco products. Avoid excess alcohol.  -ASCVD risk score: The 10-year ASCVD risk score (Arnett DK, et al., 2019) is: 28.5%   Values used to calculate the score:     Age: 48 years     Sex: Male     Is Non-Hispanic African American: No     Diabetic: Yes     Tobacco smoker: Yes     Systolic Blood Pressure: 127 mmHg     Is BP treated: Yes     HDL Cholesterol: 35 mg/dL     Total Cholesterol: 215 mg/dL    Plan for follow up: 3 months or sooner as needed.  Jodelle Red, MD, PhD [place]  [facility]    Medication Adjustments/Labs and Tests Ordered: Current medicines are reviewed at length with the patient today.  Concerns regarding medicines are outlined above.   Orders Placed This Encounter  Procedures   Lipid panel   Hepatic function panel   Meds ordered this encounter   Medications   tirzepatide (MOUNJARO) 2.5 MG/0.5ML Pen    Sig: Inject 2.5 mg into the skin once a week for 4 doses.    Dispense:  2 mL    Refill:  0   rosuvastatin (CRESTOR) 10 MG tablet    Sig: Take 1 tablet (10 mg total) by mouth daily.    Dispense:  90 tablet    Refill:  3   aspirin EC 81 MG tablet    Sig:  Take 1 tablet (81 mg total) by mouth daily. Swallow whole.    Dispense:  90 tablet    Refill:  3   Patient Instructions  .Medication Instructions:  START MOUNJARO 2.5 MG WEEKLY   START ASPIRIN 81 MG DAILY   START ROSUVASTATIN 10 MG DAILY   *If you need a refill on your cardiac medications before your next appointment, please call your pharmacy*  Lab Work: FASTING LP/LIVER FUNCTIONS IN 3 MONTHS ABOUT 1 WEEK PRIOR TO FOLLOW UP   If you have labs (blood work) drawn today and your tests are completely normal, you will receive your results only by: MyChart Message (if you have MyChart) OR A paper copy in the mail If you have any lab test that is abnormal or we need to change your treatment, we will call you to review the results.  Testing/Procedures: NONE  Follow-Up: At [facility], you and your health needs are our priority.  As part of our continuing mission to provide you with exceptional heart care, we have created designated Provider Care Teams.  These Care Teams include your primary Cardiologist (physician) and Advanced Practice Providers (APPs -  Physician Assistants and Nurse Practitioners) who all work together to provide you with the care you need, when you need it.  We recommend signing up for the patient portal called "MyChart".  Sign up information is provided on this After Visit Summary.  MyChart is used to connect with patients for Virtual Visits (Telemedicine).  Patients are able to view lab/test results, encounter notes, upcoming appointments, etc.  Non-urgent messages can be sent to your provider as well.   To learn more about what you can do with  MyChart, go to [email or web address].    Your next appointment:   3 month(s)  Provider:   Jodelle RedBridgette Rainbow Salman, MD or Gillian Shieldsaitlin Walker, NP        [facility],Mathew Stumpf,acting as a scribe for Jodelle RedBridgette Luevenia Mcavoy, MD.,have documented all relevant documentation on the behalf of Jodelle RedBridgette Koi Zangara, MD,as directed by  Jodelle RedBridgette Cyruss Arata, MD while in the presence of Jodelle RedBridgette Kymberlee Viger, MD.   I, Jodelle RedBridgette Jasyah Theurer, MD, have reviewed all documentation for this visit. The documentation on 06/21/22 for the exam, diagnosis, procedures, and orders are all accurate and complete.   Signed, Jodelle RedBridgette Hutson Luft, MD PhD 06/21/2022  [facility]

## 2022-06-21 NOTE — Patient Instructions (Addendum)
.  Medication Instructions:  START MOUNJARO 2.5 MG WEEKLY   START ASPIRIN 81 MG DAILY   START ROSUVASTATIN 10 MG DAILY   *If you need a refill on your cardiac medications before your next appointment, please call your pharmacy*  Lab Work: FASTING LP/LIVER FUNCTIONS IN 3 MONTHS ABOUT 1 WEEK PRIOR TO FOLLOW UP   If you have labs (blood work) drawn today and your tests are completely normal, you will receive your results only by: MyChart Message (if you have MyChart) OR A paper copy in the mail If you have any lab test that is abnormal or we need to change your treatment, we will call you to review the results.  Testing/Procedures: NONE  Follow-Up: At [facility], you and your health needs are our priority.  As part of our continuing mission to provide you with exceptional heart care, we have created designated Provider Care Teams.  These Care Teams include your primary Cardiologist (physician) and Advanced Practice Providers (APPs -  Physician Assistants and Nurse Practitioners) who all work together to provide you with the care you need, when you need it.  We recommend signing up for the patient portal called "MyChart".  Sign up information is provided on this After Visit Summary.  MyChart is used to connect with patients for Virtual Visits (Telemedicine).  Patients are able to view lab/test results, encounter notes, upcoming appointments, etc.  Non-urgent messages can be sent to your provider as well.   To learn more about what you can do with MyChart, go to [email or web address].    Your next appointment:   3 month(s)  Provider:   Jodelle Red, MD or Gillian Shields, NP

## 2022-07-01 ENCOUNTER — Other Ambulatory Visit: Payer: Self-pay | Admitting: Family Medicine

## 2022-07-01 ENCOUNTER — Other Ambulatory Visit (HOSPITAL_BASED_OUTPATIENT_CLINIC_OR_DEPARTMENT_OTHER): Payer: Self-pay | Admitting: Cardiology

## 2022-07-01 DIAGNOSIS — J309 Allergic rhinitis, unspecified: Secondary | ICD-10-CM

## 2022-07-01 DIAGNOSIS — I872 Venous insufficiency (chronic) (peripheral): Secondary | ICD-10-CM

## 2022-07-01 DIAGNOSIS — I10 Essential (primary) hypertension: Secondary | ICD-10-CM

## 2022-07-02 ENCOUNTER — Other Ambulatory Visit: Payer: Self-pay

## 2022-07-02 DIAGNOSIS — E1169 Type 2 diabetes mellitus with other specified complication: Secondary | ICD-10-CM

## 2022-07-02 DIAGNOSIS — E669 Obesity, unspecified: Secondary | ICD-10-CM

## 2022-07-02 NOTE — Telephone Encounter (Signed)
Rx(s) sent to pharmacy electronically.  

## 2022-07-04 ENCOUNTER — Encounter (HOSPITAL_BASED_OUTPATIENT_CLINIC_OR_DEPARTMENT_OTHER): Payer: Self-pay | Admitting: Cardiology

## 2022-07-05 ENCOUNTER — Telehealth (HOSPITAL_BASED_OUTPATIENT_CLINIC_OR_DEPARTMENT_OTHER): Payer: Self-pay | Admitting: *Deleted

## 2022-07-05 MED ORDER — MOUNJARO 5 MG/0.5ML ~~LOC~~ SOAJ: 5.0000 mg | SUBCUTANEOUS | 0 refills | Status: DC

## 2022-07-05 NOTE — Telephone Encounter (Signed)
Needs PA for MOUNJARO 2.5 MG WEEKLY   Will forward to PA team

## 2022-07-06 ENCOUNTER — Telehealth: Payer: Self-pay

## 2022-07-06 ENCOUNTER — Other Ambulatory Visit (HOSPITAL_COMMUNITY): Payer: Self-pay

## 2022-07-06 NOTE — Telephone Encounter (Signed)
Pending, please see separate encounter for update with determination

## 2022-07-06 NOTE — Telephone Encounter (Signed)
Pharmacy Patient Advocate Encounter   Received notification from [employer] that prior authorization for Philip Conley is needed.    PA submitted on 07/06/22 Key [id number] Status is pending  Haze Rushing, CPhT Pharmacy Patient Advocate Specialist Direct Number: [phone] Fax: [phone]

## 2022-07-07 ENCOUNTER — Other Ambulatory Visit (HOSPITAL_COMMUNITY): Payer: Self-pay

## 2022-07-07 ENCOUNTER — Encounter (HOSPITAL_BASED_OUTPATIENT_CLINIC_OR_DEPARTMENT_OTHER): Payer: Self-pay

## 2022-07-08 NOTE — Telephone Encounter (Signed)
Furosemide was sent to pharmacy on 4/19.   Will need to wait for the final outcome of Mounjaro PA before try Ozempic PA - don't want them to deny Ozempic because Philip Conley is in review.

## 2022-07-14 ENCOUNTER — Other Ambulatory Visit (HOSPITAL_COMMUNITY): Payer: Self-pay

## 2022-07-14 NOTE — Telephone Encounter (Signed)
Called [facility] and left message Mychart message to patient

## 2022-07-14 NOTE — Telephone Encounter (Signed)
Pharmacy Patient Advocate Encounter  Prior Authorization for Philip Conley has been approved.    Effective dates: 07/14/22 through 03/14/2098  Haze Rushing, CPhT Pharmacy Patient Advocate Specialist Direct Number: [phone] Fax: [phone]

## 2022-08-02 ENCOUNTER — Other Ambulatory Visit (HOSPITAL_COMMUNITY): Payer: Self-pay

## 2022-08-04 ENCOUNTER — Other Ambulatory Visit: Payer: Self-pay | Admitting: Family Medicine

## 2022-08-04 ENCOUNTER — Other Ambulatory Visit (HOSPITAL_BASED_OUTPATIENT_CLINIC_OR_DEPARTMENT_OTHER): Payer: Self-pay | Admitting: Cardiology

## 2022-08-04 DIAGNOSIS — K219 Gastro-esophageal reflux disease without esophagitis: Secondary | ICD-10-CM

## 2022-08-04 DIAGNOSIS — I10 Essential (primary) hypertension: Secondary | ICD-10-CM

## 2022-08-04 DIAGNOSIS — I872 Venous insufficiency (chronic) (peripheral): Secondary | ICD-10-CM

## 2022-08-04 NOTE — Telephone Encounter (Signed)
Rx request for Spironolactone sent to pharmacy. Routing request for Potassium to Dr. Cristal Deer to advise.

## 2022-08-04 NOTE — Telephone Encounter (Signed)
Please advise on refill of Potassium. Ok to refill Potassium 20 mEq--2 tablets twice daily? Last BMET in January 2024---K+ was 5.2.

## 2022-08-18 ENCOUNTER — Other Ambulatory Visit: Payer: Self-pay | Admitting: Family Medicine

## 2022-08-18 DIAGNOSIS — K219 Gastro-esophageal reflux disease without esophagitis: Secondary | ICD-10-CM

## 2022-08-18 NOTE — Telephone Encounter (Signed)
Would not fill potassium at this time--we can recheck at his follow up in July to see if he needs to restart. Thanks.

## 2022-08-26 ENCOUNTER — Other Ambulatory Visit (HOSPITAL_BASED_OUTPATIENT_CLINIC_OR_DEPARTMENT_OTHER): Payer: Self-pay | Admitting: Cardiology

## 2022-08-26 DIAGNOSIS — I872 Venous insufficiency (chronic) (peripheral): Secondary | ICD-10-CM

## 2022-08-26 DIAGNOSIS — I10 Essential (primary) hypertension: Secondary | ICD-10-CM

## 2022-08-31 ENCOUNTER — Ambulatory Visit
Admission: EM | Admit: 2022-08-31 | Discharge: 2022-08-31 | Disposition: A | Discharge status: Home / Self Care | Payer: No Typology Code available for payment source | Attending: Nurse Practitioner | Admitting: Nurse Practitioner

## 2022-08-31 DIAGNOSIS — L03116 Cellulitis of left lower limb: Secondary | ICD-10-CM | POA: Diagnosis not present

## 2022-08-31 MED ORDER — DOXYCYCLINE HYCLATE 100 MG PO TABS: 100.0000 mg | ORAL_TABLET | Freq: Two times a day (BID) | ORAL | 0 refills | Status: AC

## 2022-08-31 NOTE — Discharge Instructions (Signed)
Take medication as prescribed. May take over-the-counter Tylenol as needed for pain, fever, or general discomfort. Cool compresses to the left lower leg to help with pain and swelling. Elevate the left lower extremity above the level of the heart is much as possible to help decrease swelling. Try to stay off of the left leg is much as possible while symptoms persist. Monitor the area for worsening to include increased redness, swelling, foul-smelling drainage, or if you develop fever, chills, or chest pain.  If you develop any symptoms, please go to the emergency department immediately. Follow-up with your primary care physician if symptoms fail to improve. Follow-up as needed.

## 2022-08-31 NOTE — ED Triage Notes (Signed)
Pt c/o insect bite lower left leg near ankle, redness and bruising to the site sore tender to the touch. Watery bumps in the afftected area

## 2022-08-31 NOTE — ED Provider Notes (Signed)
[facility]    CSN: [id number] Arrival date & time: 08/31/22  1649      History   Chief Complaint No chief complaint on file.   HPI Philip Conley is a 52 y.o. male.   The history is provided by the patient.   Patient presents for complaints of swelling to the left lower extremity that has been present over the past several days.  Patient believes he may have been bitten by something.  He states over the past 24 hours, he has developed increased redness around the left lower leg, the area has become more swollen, and is starting to blister.  Patient denies fever, chills, chest pain, abdominal pain, nausea, vomiting, diarrhea, calf pain, or recent travel.  Patient describes the pain in the left lower leg is "a dull ache".  He states that he does have an underlying history of edema, but states that this is different.  The patient denies any recent travel.  Review of patient's chart with most recent hemoglobin A1c of 6.8 on 03/22/2022.  Past Medical History:  Diagnosis Date   Allergy    Hx of adenomatous and sessile serrated colonic polyps 02/18/2019   Hypertension     Patient Active Problem List   Diagnosis Date Noted   Morbid obesity (HCC) 05/05/2021   Type 2 diabetes mellitus with obesity (HCC) 01/27/2021   Chronic venous stasis 01/27/2021   BMI 45.0-49.9, adult (HCC) 03/16/2019   Hx of adenomatous and sessile serrated colonic polyps 02/18/2019   Chronic eczematous otitis externa of both ears 01/27/2018   Recurrent epistaxis 01/27/2018   Snoring 01/27/2018   Circadian rhythm sleep disorder, shift work type 01/11/2018   Subacute ethmoidal sinusitis 01/11/2018   OSA (obstructive sleep apnea) 01/11/2018   Class 3 drug-induced obesity with serious comorbidity and body mass index (BMI) of 45.0 to 49.9 in adult (HCC) 01/11/2018   Edema of both ankles 01/11/2018   Pedal edema 11/02/2017   Venous (peripheral) insufficiency 11/02/2017   Prediabetes 11/02/2017   Carpal  tunnel syndrome of right wrist 03/28/2017   Hyperlipidemia with target low density lipoprotein (LDL) cholesterol less than 130 mg/dL 40/98/1191   Migraine headache 03/31/2012   Medication monitoring encounter 03/31/2012   Insomnia 02/25/2012   Essential hypertension 02/25/2012    Past Surgical History:  Procedure Laterality Date   HAND SURGERY     Growth removal        Home Medications    Prior to Admission medications   Medication Sig Start Date End Date Taking? Authorizing Provider  doxycycline (VIBRA-TABS) 100 MG tablet Take 1 tablet (100 mg total) by mouth 2 (two) times daily for 7 days. 08/31/22 09/07/22 Yes Meliss Fleek-Warren, Sadie Haber, NP  aspirin EC 81 MG tablet Take 1 tablet (81 mg total) by mouth daily. Swallow whole. 06/21/22   Jodelle Red, MD  carvedilol (COREG) 6.25 MG tablet TAKE 1 TABLET BY MOUTH TWICE DAILY WITH A MEAL 05/03/22   Jodelle Red, MD  cyclobenzaprine (FLEXERIL) 5 MG tablet TAKE 1 TABLET(5 MG) BY MOUTH THREE TIMES DAILY AS NEEDED FOR MUSCLE SPASMS 11/24/20   [name], MD  EQ ALLERGY RELIEF, CETIRIZINE, 10 MG tablet TAKE 1 TABLET BY MOUTH ONCE DAILY AT BEDTIME 07/02/22   [name], MD  furosemide (LASIX) 40 MG tablet Take 2 tablets by mouth once daily 07/02/22   Jodelle Red, MD  pantoprazole (PROTONIX) 20 MG tablet TAKE 1 TABLET BY MOUTH ONCE DAILY BEFORE SUPPER 08/18/22   [name],  MD  potassium chloride SA (KLOR-CON M) 20 MEQ tablet Take 2 tablets by mouth twice daily 08/27/22   Jodelle Red, MD  rosuvastatin (CRESTOR) 10 MG tablet Take 1 tablet (10 mg total) by mouth daily. 06/21/22 06/16/23  Jodelle Red, MD  spironolactone (ALDACTONE) 25 MG tablet Take 1 tablet by mouth once daily 08/04/22   Jodelle Red, MD  tirzepatide [facility]) 5 MG/0.5ML Pen Inject 5 mg into the skin once a week. 07/05/22   Jodelle Red, MD    Family History Family History  Problem Relation Age of  Onset   Heart disease Mother        mumur   Hypertension Mother    Lupus Father    Stroke Father    Heart disease Maternal Grandmother    Lung disease Maternal Grandfather    Heart disease Paternal Grandmother    Stroke Paternal Grandfather    Colon polyps Neg Hx    Esophageal cancer Neg Hx    Rectal cancer Neg Hx    Stomach cancer Neg Hx     Social History Social History   Tobacco Use   Smoking status: Never   Smokeless tobacco: Never  Vaping Use   Vaping Use: Never used  Substance Use Topics   Alcohol use: Not Currently    Comment: occ   Drug use: Never     Allergies   Banana   Review of Systems Review of Systems Per HPI  Physical Exam Triage Vital Signs ED Triage Vitals  Enc Vitals Group     BP 08/31/22 1657 111/75     Pulse Rate 08/31/22 1657 85     Resp 08/31/22 1657 17     Temp 08/31/22 1657 98.2 F (36.8 C)     Temp Source 08/31/22 1657 Oral     SpO2 08/31/22 1657 93 %     Weight --      Height --      Head Circumference --      Peak Flow --      Pain Score 08/31/22 1659 5     Pain Loc --      Pain Edu? --      Excl. in GC? --    No data found.  Updated Vital Signs BP 111/75 (BP Location: Right Arm)   Pulse 85   Temp 98.2 F (36.8 C) (Oral)   Resp 17   SpO2 93%   Visual Acuity Right Eye Distance:   Left Eye Distance:   Bilateral Distance:    Right Eye Near:   Left Eye Near:    Bilateral Near:     Physical Exam Vitals and nursing note reviewed.  Constitutional:      General: He is not in acute distress.    Appearance: Normal appearance.  HENT:     Head: Normocephalic.  Eyes:     Extraocular Movements: Extraocular movements intact.     Conjunctiva/sclera: Conjunctivae normal.     Pupils: Pupils are equal, round, and reactive to light.  Cardiovascular:     Rate and Rhythm: Normal rate and regular rhythm.     Pulses: Normal pulses.     Heart sounds: Normal heart sounds.  Pulmonary:     Effort: Pulmonary effort is normal.  No respiratory distress.     Breath sounds: Normal breath sounds. No stridor. No wheezing, rhonchi or rales.  Abdominal:     General: Bowel sounds are normal.     Palpations: Abdomen is soft.  Musculoskeletal:  Cervical back: Normal range of motion.     Left lower leg: Swelling and tenderness present. No deformity.     Comments: Left calf is nontender to palpation, swelling is present. Negative Homans' sign.  No erythema or warmth noted to the left calf.  Lymphadenopathy:     Cervical: No cervical adenopathy.  Skin:    General: Skin is warm and dry.     Findings: Erythema present.     Comments: Left lower leg with large area of erythema above the ankle that extends around the extremity.  Area with central punctum consisting of esharotic tissue present.  Blistering noted throughout the area of erythema, scaling skin noted to the posterior region of the left lower leg.  The area is tender and warm to palpation.  Swelling noted to the left lower leg extending up into the left calf.  Neurological:     General: No focal deficit present.     Mental Status: He is alert and oriented to person, place, and time.  Psychiatric:        Mood and Affect: Mood normal.        Behavior: Behavior normal.      UC Treatments / Results  Labs (all labs ordered are listed, but only abnormal results are displayed) Labs Reviewed - No data to display  EKG   Radiology No results found.  Procedures Procedures (including critical care time)  Medications Ordered in UC Medications - No data to display  Initial Impression / Assessment and Plan / UC Course  I have reviewed the triage vital signs and the nursing notes.  Pertinent labs & imaging results that were available during my care of the patient were reviewed by me and considered in my medical decision making (see chart for details).  The patient is well-appearing, he is in no acute distress, vital signs are stable.  Patient presents with  redness, swelling, and warmth to the left lower extremity that is been present for the past several days.  On exam, the area is warm and tender to palpation.  Do not suspect DVT of the left lower extremity.  Symptoms appear to be consistent with cellulitis.  Will treat with doxycycline 100 mg twice daily.  Supportive care recommendations were provided and discussed with the patient to include over-the-counter Tylenol for pain or discomfort, cool compresses to the left lower leg to help with pain and swelling, elevation is much as possible while symptoms persist, and monitoring the area for worsening.  Patient was advised that if the symptoms do worsen, it is recommended that he go to the emergency department immediately for further evaluation.  Patient is in agreement with this plan of care and verbalizes understanding.  All questions were answered.  Patient stable for discharge.   Final Clinical Impressions(s) / UC Diagnoses   Final diagnoses:  Cellulitis of left lower extremity     Discharge Instructions      Take medication as prescribed. May take over-the-counter Tylenol as needed for pain, fever, or general discomfort. Cool compresses to the left lower leg to help with pain and swelling. Elevate the left lower extremity above the level of the heart is much as possible to help decrease swelling. Try to stay off of the left leg is much as possible while symptoms persist. Monitor the area for worsening to include increased redness, swelling, foul-smelling drainage, or if you develop fever, chills, or chest pain.  If you develop any symptoms, please go to the emergency department  immediately. Follow-up with your primary care physician if symptoms fail to improve. Follow-up as needed.     ED Prescriptions     Medication Sig Dispense Auth. Provider   doxycycline (VIBRA-TABS) 100 MG tablet Take 1 tablet (100 mg total) by mouth 2 (two) times daily for 7 days. 14 tablet Yannely Kintzel-Warren, Sadie Haber, NP      PDMP not reviewed this encounter.   Abran Cantor, NP 08/31/22 1736

## 2022-09-04 ENCOUNTER — Encounter (HOSPITAL_BASED_OUTPATIENT_CLINIC_OR_DEPARTMENT_OTHER): Payer: Self-pay

## 2022-09-04 DIAGNOSIS — E669 Obesity, unspecified: Secondary | ICD-10-CM

## 2022-09-04 DIAGNOSIS — Z6841 Body Mass Index (BMI) 40.0 and over, adult: Secondary | ICD-10-CM

## 2022-09-06 MED ORDER — MOUNJARO 7.5 MG/0.5ML ~~LOC~~ SOAJ
7.5000 mg | SUBCUTANEOUS | 0 refills | Status: DC
Start: 2022-09-06 — End: 2023-01-06

## 2022-09-06 MED ORDER — MOUNJARO 10 MG/0.5ML ~~LOC~~ SOAJ
10.0000 mg | SUBCUTANEOUS | 3 refills | Status: DC
Start: 2022-09-06 — End: 2023-04-27

## 2022-09-06 NOTE — Telephone Encounter (Signed)
Please advise if okay to fill next dose?

## 2022-09-06 NOTE — Telephone Encounter (Signed)
Okay to fill the 7.5 mg and 10 mg doses.

## 2022-09-29 ENCOUNTER — Other Ambulatory Visit: Payer: Self-pay | Admitting: Family Medicine

## 2022-09-29 DIAGNOSIS — K219 Gastro-esophageal reflux disease without esophagitis: Secondary | ICD-10-CM

## 2022-10-01 ENCOUNTER — Other Ambulatory Visit: Payer: Self-pay | Admitting: Family Medicine

## 2022-10-01 DIAGNOSIS — J309 Allergic rhinitis, unspecified: Secondary | ICD-10-CM

## 2022-10-05 LAB — LIPID PANEL
Chol/HDL Ratio: 3 ratio (ref 0.0–5.0)
Cholesterol, Total: 78 mg/dL — ABNORMAL LOW (ref 100–199)
HDL: 26 mg/dL — ABNORMAL LOW (ref >39)
LDL Chol Calc (NIH): 32 mg/dL (ref 0–99)
Triglycerides: 104 mg/dL (ref 0–149)
VLDL Cholesterol Cal: 20 mg/dL (ref 5–40)

## 2022-10-05 LAB — HEPATIC FUNCTION PANEL
ALT: 42 IU/L (ref 0–44)
AST: 43 IU/L — ABNORMAL HIGH (ref 0–40)
Albumin: 4.6 g/dL (ref 3.8–4.9)
Alkaline Phosphatase: 74 IU/L (ref 44–121)
Bilirubin Total: 0.5 mg/dL (ref 0.0–1.2)
Bilirubin, Direct: 0.2 mg/dL (ref 0.00–0.40)
Total Protein: 7.1 g/dL (ref 6.0–8.5)

## 2022-10-08 NOTE — Progress Notes (Incomplete)
  Cardiology Office Note:  .    Date:  10/08/2022  ID:  [name], DOB 1970-09-28, MRN [id number] PCP: [name], MD  [facility] Providers Cardiologist:  Jodelle Red, MD { Click to update primary MD,subspecialty MD or APP then REFRESH:1}    History of Present Illness: Karee Fausey is a 52 y.o. male   Today,  He denies any palpitations, chest pain, shortness of breath, peripheral edema, lightheadedness, headaches, syncope, orthopnea, or PND.  ROS:  Please see the history of present illness. ROS otherwise negative except as noted.  (+)  Studies Reviewed: .         Risk Assessment/Calculations:    {Does this patient have ATRIAL FIBRILLATION?[phone]} No BP recorded.  {Refresh Note OR Click here to enter BP  :1}***       Physical Exam:    VS:  There were no vitals taken for this visit.   Wt Readings from Last 3 Encounters:  06/21/22 297 lb 11.2 oz (135 kg)  03/18/22 285 lb 3.2 oz (129.4 kg)  06/15/21 272 lb 3.2 oz (123.5 kg)    GEN: Well nourished, well developed in no acute distress HEENT: Normal, moist mucous membranes NECK: No JVD CARDIAC: regular rhythm, normal S1 and S2, no rubs or gallops. ***No murmur. VASCULAR: Radial and DP pulses 2+ bilaterally. No carotid bruits RESPIRATORY:  Clear to auscultation without rales, wheezing or rhonchi  ABDOMEN: Soft, non-tender, non-distended MUSCULOSKELETAL:  Ambulates independently SKIN: Warm and dry, no edema NEUROLOGIC:  Alert and oriented x 3. No focal neuro deficits noted. PSYCHIATRIC:  Normal affect   ASSESSMENT AND PLAN: .    ***     {Are you ordering a CV Procedure (e.g. stress test, cath, DCCV, TEE, etc)?   Press F2        :[phone]}  Dispo: Follow-up in *** months, or sooner as needed.  I,Mathew Stumpf,acting as a Neurosurgeon for [employer], MD.,have documented all relevant documentation on the behalf of Jodelle Red, MD,as directed by  Jodelle Red,  MD while in the presence of Jodelle Red, MD.  ***  Signed, Carlena Bjornstad

## 2022-10-11 ENCOUNTER — Ambulatory Visit (HOSPITAL_BASED_OUTPATIENT_CLINIC_OR_DEPARTMENT_OTHER): Payer: No Typology Code available for payment source | Admitting: Cardiology

## 2022-10-12 ENCOUNTER — Other Ambulatory Visit: Payer: Self-pay | Admitting: Family Medicine

## 2022-10-12 ENCOUNTER — Other Ambulatory Visit (HOSPITAL_BASED_OUTPATIENT_CLINIC_OR_DEPARTMENT_OTHER): Payer: Self-pay | Admitting: Cardiology

## 2022-10-12 DIAGNOSIS — K219 Gastro-esophageal reflux disease without esophagitis: Secondary | ICD-10-CM

## 2022-10-12 DIAGNOSIS — J309 Allergic rhinitis, unspecified: Secondary | ICD-10-CM

## 2022-10-12 DIAGNOSIS — I10 Essential (primary) hypertension: Secondary | ICD-10-CM

## 2022-10-12 NOTE — Telephone Encounter (Signed)
Rx request sent to pharmacy.  

## 2022-10-15 ENCOUNTER — Other Ambulatory Visit: Payer: Self-pay | Admitting: Family Medicine

## 2022-10-15 DIAGNOSIS — J309 Allergic rhinitis, unspecified: Secondary | ICD-10-CM

## 2022-10-15 DIAGNOSIS — K219 Gastro-esophageal reflux disease without esophagitis: Secondary | ICD-10-CM

## 2022-10-18 ENCOUNTER — Other Ambulatory Visit (HOSPITAL_BASED_OUTPATIENT_CLINIC_OR_DEPARTMENT_OTHER): Payer: Self-pay

## 2022-10-18 ENCOUNTER — Other Ambulatory Visit: Payer: Self-pay | Admitting: Family Medicine

## 2022-10-18 DIAGNOSIS — I10 Essential (primary) hypertension: Secondary | ICD-10-CM

## 2022-10-18 DIAGNOSIS — K219 Gastro-esophageal reflux disease without esophagitis: Secondary | ICD-10-CM

## 2022-10-18 DIAGNOSIS — I872 Venous insufficiency (chronic) (peripheral): Secondary | ICD-10-CM

## 2022-10-18 MED ORDER — POTASSIUM CHLORIDE CRYS ER 20 MEQ PO TBCR
40.0000 meq | EXTENDED_RELEASE_TABLET | Freq: Two times a day (BID) | ORAL | 3 refills | Status: DC
Start: 2022-10-18 — End: 2023-10-31

## 2022-11-20 ENCOUNTER — Other Ambulatory Visit: Payer: Self-pay | Admitting: Family Medicine

## 2022-11-20 DIAGNOSIS — J309 Allergic rhinitis, unspecified: Secondary | ICD-10-CM

## 2022-11-21 ENCOUNTER — Other Ambulatory Visit: Payer: Self-pay | Admitting: Family Medicine

## 2022-11-21 DIAGNOSIS — K219 Gastro-esophageal reflux disease without esophagitis: Secondary | ICD-10-CM

## 2022-11-27 ENCOUNTER — Other Ambulatory Visit: Payer: Self-pay | Admitting: Family Medicine

## 2022-11-27 DIAGNOSIS — K219 Gastro-esophageal reflux disease without esophagitis: Secondary | ICD-10-CM

## 2022-12-14 ENCOUNTER — Other Ambulatory Visit: Payer: Self-pay | Admitting: Family Medicine

## 2022-12-14 DIAGNOSIS — K219 Gastro-esophageal reflux disease without esophagitis: Secondary | ICD-10-CM

## 2022-12-14 DIAGNOSIS — J309 Allergic rhinitis, unspecified: Secondary | ICD-10-CM

## 2023-01-06 ENCOUNTER — Encounter (HOSPITAL_BASED_OUTPATIENT_CLINIC_OR_DEPARTMENT_OTHER): Payer: Self-pay | Admitting: Cardiology

## 2023-01-06 ENCOUNTER — Ambulatory Visit (INDEPENDENT_AMBULATORY_CARE_PROVIDER_SITE_OTHER): Payer: No Typology Code available for payment source | Admitting: Cardiology

## 2023-01-06 VITALS — BP 118/74 | HR 84 | Ht 66.0 in | Wt 262.2 lb

## 2023-01-06 DIAGNOSIS — I872 Venous insufficiency (chronic) (peripheral): Secondary | ICD-10-CM | POA: Diagnosis not present

## 2023-01-06 DIAGNOSIS — I10 Essential (primary) hypertension: Secondary | ICD-10-CM

## 2023-01-06 DIAGNOSIS — E1169 Type 2 diabetes mellitus with other specified complication: Secondary | ICD-10-CM

## 2023-01-06 DIAGNOSIS — Z7189 Other specified counseling: Secondary | ICD-10-CM

## 2023-01-06 DIAGNOSIS — E669 Obesity, unspecified: Secondary | ICD-10-CM

## 2023-01-06 DIAGNOSIS — I878 Other specified disorders of veins: Secondary | ICD-10-CM

## 2023-01-06 MED ORDER — FUROSEMIDE 40 MG PO TABS: 80.0000 mg | ORAL_TABLET | Freq: Every day | ORAL | 3 refills | Status: AC

## 2023-01-06 NOTE — Patient Instructions (Signed)
Medication Instructions:  Your physician recommends that you continue on your current medications as directed. Please refer to the Current Medication list given to you today.  *If you need a refill on your cardiac medications before your next appointment, please call your pharmacy*  Follow-Up: At [facility], you and your health needs are our priority.  As part of our continuing mission to provide you with exceptional heart care, we have created designated Provider Care Teams.  These Care Teams include your primary Cardiologist (physician) and Advanced Practice Providers (APPs -  Physician Assistants and Nurse Practitioners) who all work together to provide you with the care you need, when you need it.  We recommend signing up for the patient portal called "MyChart".  Sign up information is provided on this After Visit Summary.  MyChart is used to connect with patients for Virtual Visits (Telemedicine).  Patients are able to view lab/test results, encounter notes, upcoming appointments, etc.  Non-urgent messages can be sent to your provider as well.   To learn more about what you can do with MyChart, go to [email or web address].    Your next appointment:   6 month(s)  Provider:   Bridgette Christopher, MD    

## 2023-01-06 NOTE — Progress Notes (Signed)
Cardiology Office Note:  .    Date:  01/06/2023  ID:  Philip Conley, DOB 1970-10-10, MRN [id number] PCP: [name], MD  [facility] Providers Cardiologist:  Jodelle Red, MD     History of Present Illness: Philip Conley is a 52 y.o. male with a hx of hypertension, LE edema, who is seen for follow-up. I initially met him 07/24/2019 as a new consult at the request of [name], MD for the evaluation and management of hypertension with edema.   He presented for a nurse visit 05/18/2021 and his blood pressure was 103/72. Olmesartan was stopped.   In 03/2022, he was doing well. His blood pressure was controlled in clinic, and he reported good readings at home such as 124/80 or a bit lower. He also noted some improvement in his LE edema, with some days better than others. Chronic venous stasis changes appeared improved. We reviewed salt avoidance, compression, elevation. Continued to work on weight loss.   At his visit 06/2022, he was struggling with weight loss. His insurance would not cover [phone]; had been off it since October. We reviewed his labs with an A1c of 6.8 as of 03/2022. Complained of myalgias and muscle cramping. Had seasonal allergies with sneezing that once resulted in 30-minute epistaxis episode; BP at the time was 119/79. We initiated Mounjaro, 81 mg ASA, and 10 mg rosuvastatin.   Today, he states he is feeling well aside from fighting a recent cold/allergies. Has had chest congestion, cough, and lightheadedness recently. He monitors his home blood pressures once a week which have been similar to 118/74, or lower.   When he initially started [facility] his peak weight was 309 lbs. Previously noticed the most weight loss and increased nausea on the 5.0 mg dose of Wegovy. When he started [facility] in 06/2022 his weight was 297 lbs. At home two weeks ago his weight was 256 lbs. Until recently, he had not experienced any adverse effects from [facility]. However,  after his last injection he developed an erythematous rash at the abdominal injection site.  He complains of atypical muscle cramping that he describes as "locking". The fingers and thumbs of his bilateral hands will intermittently "just lock", forcing him to pry/bend them into place. This has occurred in all of his fingers, seems to randomly occur in different fingers/thumbs each time. Often triggered by holding a pen/pencil or using a work tool for prolonged periods. This has been an ongoing issue since prior to starting his cholesterol medication, but has been more frequent lately.  He denies any palpitations, headaches, syncope, orthopnea, or PND.  ROS:  Please see the history of present illness. ROS otherwise negative except as noted.  (+) Seasonal allergies (+) Chest congestion (+) Cough (+) Lightheadedness (+) Muscle cramping/locking of bilateral fingers and thumbs (+) Erythematous rash of left abdomen  Studies Reviewed: Marland Kitchen         Physical Exam:    VS:  BP 118/74   Pulse 84   Ht 5\' 6"  (1.676 m)   Wt 262 lb 3.2 oz (118.9 kg)   SpO2 97%   BMI 42.32 kg/m    Wt Readings from Last 3 Encounters:  01/06/23 262 lb 3.2 oz (118.9 kg)  06/21/22 297 lb 11.2 oz (135 kg)  03/18/22 285 lb 3.2 oz (129.4 kg)    GEN: Well nourished, well developed in no acute distress HEENT: Normal, moist mucous membranes NECK: No JVD CARDIAC: regular rhythm, normal S1 and  S2, no rubs or gallops. No murmur. VASCULAR: Radial and DP pulses 2+ bilaterally. No carotid bruits RESPIRATORY:  Clear to auscultation without rales, wheezing or rhonchi  ABDOMEN: Soft, non-tender, non-distended MUSCULOSKELETAL:  Ambulates independently SKIN: Warm and dry, chronic venous stasis edema. Left abdominal rash/erythema. NEUROLOGIC:  Alert and oriented x 3. No focal neuro deficits noted. PSYCHIATRIC:  Normal affect   ASSESSMENT AND PLAN: .    Type II diabetes Morbid obesity, BMI 48->42 -tolerating mounjaro at 10  mg/week dose, will contact us if he wants to increase this -continue aspirin, statin   Lower extremity edema -much improved with weight loss, doing well on lasix and spironolactone -echo unremarkable -chronic venous stasis changes appear improved today -reviewed salt avoidance, compression, elevation.    Hypertension:  -have stopped olmesartan, with stabilization of his BP and fewer symptoms -continue carvedilol 6.25 mg BID, lasix 80 mg in the AM, spironolactone 25 mg in the PM -as he continues to lose weight, may need to continue cutting back on antihypertensives -he will contact me with any new or worsening symptoms   Cardiac risk counseling and prevention recommendations: -recommend heart healthy/Mediterranean diet, with whole grains, fruits, vegetable, fish, lean meats, nuts, and olive oil. Limit salt. -recommend moderate walking, 3-5 times/week for 30-50 minutes each session. Aim for at least 150 minutes.week. Goal should be pace of 3 miles/hours, or walking 1.5 miles in 30 minutes -recommend avoidance of tobacco products. Avoid excess alcohol.  -ASCVD risk score: The ASCVD Risk score (Arnett DK, et al., 2019) failed to calculate for the following reasons:   The valid total cholesterol range is 130 to 320 mg/dL   Dispo: Follow-up in 6 months, or sooner as needed.  I,Mathew Stumpf,acting as a Neurosurgeon for [employer], MD.,have documented all relevant documentation on the behalf of Jodelle Red, MD,as directed by  Jodelle Red, MD while in the presence of Jodelle Red, MD.  I, Jodelle Red, MD, have reviewed all documentation for this visit. The documentation on 02/27/23 for the exam, diagnosis, procedures, and orders are all accurate and complete.   Signed, Jodelle Red, MD

## 2023-02-27 ENCOUNTER — Encounter (HOSPITAL_BASED_OUTPATIENT_CLINIC_OR_DEPARTMENT_OTHER): Payer: Self-pay | Admitting: Cardiology

## 2023-03-05 ENCOUNTER — Other Ambulatory Visit: Payer: Self-pay | Admitting: Family Medicine

## 2023-03-05 DIAGNOSIS — K219 Gastro-esophageal reflux disease without esophagitis: Secondary | ICD-10-CM

## 2023-03-05 DIAGNOSIS — J309 Allergic rhinitis, unspecified: Secondary | ICD-10-CM

## 2023-04-27 ENCOUNTER — Encounter (HOSPITAL_BASED_OUTPATIENT_CLINIC_OR_DEPARTMENT_OTHER): Payer: Self-pay

## 2023-04-27 DIAGNOSIS — Z6841 Body Mass Index (BMI) 40.0 and over, adult: Secondary | ICD-10-CM

## 2023-04-27 DIAGNOSIS — E1169 Type 2 diabetes mellitus with other specified complication: Secondary | ICD-10-CM

## 2023-04-27 MED ORDER — MOUNJARO 15 MG/0.5ML ~~LOC~~ SOAJ: 15.0000 mg | SUBCUTANEOUS | 0 refills | Status: DC

## 2023-04-27 NOTE — Telephone Encounter (Signed)
Fine to increase to 15 mg.  See if he has lost any weight.

## 2023-05-17 ENCOUNTER — Other Ambulatory Visit: Payer: Self-pay | Admitting: Family Medicine

## 2023-05-17 ENCOUNTER — Other Ambulatory Visit (HOSPITAL_BASED_OUTPATIENT_CLINIC_OR_DEPARTMENT_OTHER): Payer: Self-pay | Admitting: Cardiology

## 2023-05-17 DIAGNOSIS — J309 Allergic rhinitis, unspecified: Secondary | ICD-10-CM

## 2023-05-17 DIAGNOSIS — I10 Essential (primary) hypertension: Secondary | ICD-10-CM

## 2023-05-17 DIAGNOSIS — E1169 Type 2 diabetes mellitus with other specified complication: Secondary | ICD-10-CM

## 2023-05-29 ENCOUNTER — Other Ambulatory Visit (HOSPITAL_BASED_OUTPATIENT_CLINIC_OR_DEPARTMENT_OTHER): Payer: Self-pay | Admitting: Cardiology

## 2023-05-29 DIAGNOSIS — I10 Essential (primary) hypertension: Secondary | ICD-10-CM

## 2023-06-09 ENCOUNTER — Other Ambulatory Visit (HOSPITAL_BASED_OUTPATIENT_CLINIC_OR_DEPARTMENT_OTHER): Payer: Self-pay | Admitting: Cardiology

## 2023-06-09 ENCOUNTER — Other Ambulatory Visit: Payer: Self-pay | Admitting: Family Medicine

## 2023-06-09 DIAGNOSIS — K219 Gastro-esophageal reflux disease without esophagitis: Secondary | ICD-10-CM

## 2023-07-08 ENCOUNTER — Other Ambulatory Visit: Payer: Self-pay | Admitting: Family Medicine

## 2023-07-08 ENCOUNTER — Other Ambulatory Visit (HOSPITAL_BASED_OUTPATIENT_CLINIC_OR_DEPARTMENT_OTHER): Payer: Self-pay | Admitting: Cardiology

## 2023-07-08 DIAGNOSIS — J309 Allergic rhinitis, unspecified: Secondary | ICD-10-CM

## 2023-07-08 DIAGNOSIS — K219 Gastro-esophageal reflux disease without esophagitis: Secondary | ICD-10-CM

## 2023-07-12 ENCOUNTER — Ambulatory Visit (HOSPITAL_BASED_OUTPATIENT_CLINIC_OR_DEPARTMENT_OTHER): Payer: No Typology Code available for payment source | Admitting: Cardiology

## 2023-08-25 ENCOUNTER — Encounter (HOSPITAL_BASED_OUTPATIENT_CLINIC_OR_DEPARTMENT_OTHER): Payer: Self-pay

## 2023-08-25 ENCOUNTER — Encounter (HOSPITAL_BASED_OUTPATIENT_CLINIC_OR_DEPARTMENT_OTHER): Payer: Self-pay | Admitting: Cardiology

## 2023-08-25 ENCOUNTER — Ambulatory Visit (HOSPITAL_BASED_OUTPATIENT_CLINIC_OR_DEPARTMENT_OTHER): Admitting: Cardiology

## 2023-08-25 ENCOUNTER — Other Ambulatory Visit (HOSPITAL_BASED_OUTPATIENT_CLINIC_OR_DEPARTMENT_OTHER): Payer: Self-pay

## 2023-08-25 VITALS — BP 102/78 | HR 79 | Ht 66.0 in | Wt 239.5 lb

## 2023-08-25 DIAGNOSIS — E1169 Type 2 diabetes mellitus with other specified complication: Secondary | ICD-10-CM

## 2023-08-25 DIAGNOSIS — I872 Venous insufficiency (chronic) (peripheral): Secondary | ICD-10-CM | POA: Diagnosis not present

## 2023-08-25 DIAGNOSIS — K219 Gastro-esophageal reflux disease without esophagitis: Secondary | ICD-10-CM | POA: Diagnosis not present

## 2023-08-25 DIAGNOSIS — Z7189 Other specified counseling: Secondary | ICD-10-CM

## 2023-08-25 DIAGNOSIS — I10 Essential (primary) hypertension: Secondary | ICD-10-CM

## 2023-08-25 DIAGNOSIS — E669 Obesity, unspecified: Secondary | ICD-10-CM

## 2023-08-25 MED ORDER — PANTOPRAZOLE SODIUM 20 MG PO TBEC
20.0000 mg | DELAYED_RELEASE_TABLET | Freq: Every day | ORAL | 3 refills | Status: AC
  Filled 2023-08-25: qty 90, 90d supply, fill #0
  Filled 2023-11-18: qty 90, 90d supply, fill #1
  Filled 2024-01-28: qty 90, 90d supply, fill #2

## 2023-08-25 MED ORDER — CARVEDILOL 3.125 MG PO TABS
3.1250 mg | ORAL_TABLET | Freq: Two times a day (BID) | ORAL | 3 refills | Status: AC
  Filled 2023-08-25: qty 180, 90d supply, fill #0
  Filled 2023-11-18: qty 180, 90d supply, fill #1
  Filled 2024-01-28: qty 180, 90d supply, fill #2

## 2023-08-25 MED ORDER — TIRZEPATIDE 10 MG/0.5ML ~~LOC~~ SOAJ
10.0000 mg | SUBCUTANEOUS | 11 refills | Status: DC
  Filled 2023-08-25: qty 2, 28d supply, fill #0

## 2023-08-25 MED ORDER — MOUNJARO 15 MG/0.5ML ~~LOC~~ SOAJ
15.0000 mg | SUBCUTANEOUS | 3 refills | Status: AC
  Filled 2023-08-25 – 2023-09-17 (×2): qty 6, 84d supply, fill #0
  Filled 2023-12-08: qty 6, 84d supply, fill #1
  Filled 2024-02-29: qty 6, 84d supply, fill #2

## 2023-08-25 NOTE — Progress Notes (Signed)
 Cardiology Office Note:  .    Date:  08/25/2023  ID:  Philip Conley, DOB [birth year], MRN [id number] PCP: Juvenal Opoka  [facility] Providers Cardiologist:  Sheryle Donning, MD     History of Present Illness: Philip Conley is a 53 y.o. male with a hx of hypertension, LE edema, who is seen for follow-up. I initially met him 07/24/2019 as a new consult at the request of Benjiman Bras, MD for the evaluation and management of hypertension with edema.  CV history: seen for hypertension and edema 07/2019. Echo with normal systolic and diastolic function, no valve disease, RAP 3 mmHg. Felt to be likely venous insufficiency.   On Wegovy , starting weight 309 lbs. Had increase in nausea at 5 mg dose. Has also had mild injection site reaction. Tolerated increase to 10 mg dose. Tried 15 mg dose but did better on 10 mg, will continue this dose long term.   Today: Overall doing well, but struggles with burning/indigestion. Even with drinking water, he has heartburn. Was on 10 mg tirzepatide  but hasn't had in a few weeks, indigestion was actually better on the tirzepatide . Hasn't had nausea recently. Temporarily relief with Tums/Rolaids. Has tried OTC pepcid , didn't help. No longer on protonix , thinks this didn't help (tried up to 40 mg), but last notes from Dr. Ester Helms in 2022 note controlled symptoms on 20 mg dose. Prescription famotidine  last used in 2021 at 20 mg, thinks this was helpful. He is amenable to re-trialing pantoprazole  20 mg daily and monitoring effect, will follow up with PCP. No pain, no emesis, no melena/hematochezia.   Weight down today to 239 lbs (peak 309 lbs), BMI 38. Refilled script today. BP continues to decrease, lightheadedness only if he has been working outside for a time.  LE edema worse if he drinks a lot of water, well managed with lasix  and spironolactone .  Only short of breath when allergies are severe. Only very heavy lifting/intense exertion causes shortness of  breath.  ROS:  Denies chest pain, shortness of breath at rest or with normal exertion. No PND, orthopnea, worsening LE edema or unexpected weight gain. No syncope or palpitations. ROS otherwise negative except as noted.   Studies Reviewed: Aaron Aas    EKG Interpretation Date/Time:  Thursday August 25 2023 08:06:38 EDT Ventricular Rate:  79 PR Interval:  170 QRS Duration:  84 QT Interval:  366 QTC Calculation: 419 R Axis:   14  Text Interpretation: Normal sinus rhythm Normal ECG Confirmed by Sheryle Donning [id number]) on 08/25/2023 8:15:44 AM    Physical Exam:    VS:  BP 102/78 (BP Location: Right Arm, Patient Position: Sitting, Cuff Size: Normal)   Pulse 79   Ht 5' 6 (1.676 m)   Wt 239 lb 8 oz (108.6 kg)   SpO2 95%   BMI 38.66 kg/m    Wt Readings from Last 3 Encounters:  08/25/23 239 lb 8 oz (108.6 kg)  01/06/23 262 lb 3.2 oz (118.9 kg)  06/21/22 297 lb 11.2 oz (135 kg)    GEN: Well nourished, well developed in no acute distress HEENT: Normal, moist mucous membranes NECK: No JVD CARDIAC: regular rhythm, normal S1 and S2, no rubs or gallops. No murmur. VASCULAR: Radial and DP pulses 2+ bilaterally. No carotid bruits RESPIRATORY:  Clear to auscultation without rales, wheezing or rhonchi  ABDOMEN: Soft, non-tender, non-distended MUSCULOSKELETAL:  Ambulates independently SKIN: Warm and dry, no pitting LE edema NEUROLOGIC:  Alert and oriented x 3. No  focal neuro deficits noted. PSYCHIATRIC:  Normal affect    ASSESSMENT AND PLAN: .    GERD -will re-trial pantoprazole  20 mg daily, he will follow up with PCP on effects -reviewed watching for melena/hematochezia -improved with antacids, reviewed red flags that need immediate medical attention (angina vs gerd)  Type II diabetes Morbid obesity, BMI 48->38 -tolerating mounjaro  at 10 mg/week dose, refilled today -continue aspirin , statin -due for labs soon, but planning to see PCP for annual visit, will likely have labs drawn at  that time   Lower extremity edema -much improved with weight loss, doing well on lasix  and spironolactone  -echo unremarkable -chronic venous stasis changes appear improved today -reviewed salt avoidance, compression, elevation.    Hypertension:  -previously stopped olmesartan , with stabilization of his BP and fewer symptoms -BP has been well controlled to low at home (lowest 98/65). Will cut carvedilol  to 3.125 mg BID; if BP still low at PCP follow up, can stop carvedilol  entirely -continue lasix  80 mg in the AM, spironolactone  25 mg in the PM -as he continues to lose weight, may need to continue cutting back on antihypertensives -he will contact me with any new or worsening symptoms   Cardiac risk counseling and prevention recommendations: -recommend heart healthy/Mediterranean diet, with whole grains, fruits, vegetable, fish, lean meats, nuts, and olive oil. Limit salt. -recommend moderate walking, 3-5 times/week for 30-50 minutes each session. Aim for at least 150 minutes.week. Goal should be pace of 3 miles/hours, or walking 1.5 miles in 30 minutes -recommend avoidance of tobacco products. Avoid excess alcohol.  -ASCVD risk score: The ASCVD Risk score (Arnett DK, et al., 2019) failed to calculate for the following reasons:   The valid total cholesterol range is 130 to 320 mg/dL   Dispo: Follow-up in 6 months, or sooner as needed.  Signed, Sheryle Donning, MD

## 2023-08-25 NOTE — Patient Instructions (Addendum)
 Medication Instructions:  Your physician has recommended you make the following change in your medication:   -Started protonix  back. Follow up with PCP to make sure this is working -Cut carvedilol  back to 3.125 mg twice a day. If BP still running low, can stop this at PCP visit -Refilled mounjaro  at 10 mg/week dose  Follow-Up: Please follow up in 6 months with Dr. Veryl Gottron, Slater Duncan, NP or Neomi Banks, NP

## 2023-09-17 ENCOUNTER — Other Ambulatory Visit (HOSPITAL_BASED_OUTPATIENT_CLINIC_OR_DEPARTMENT_OTHER): Payer: Self-pay

## 2023-10-20 ENCOUNTER — Other Ambulatory Visit (HOSPITAL_BASED_OUTPATIENT_CLINIC_OR_DEPARTMENT_OTHER): Payer: Self-pay

## 2023-10-31 ENCOUNTER — Other Ambulatory Visit (HOSPITAL_BASED_OUTPATIENT_CLINIC_OR_DEPARTMENT_OTHER): Payer: Self-pay | Admitting: Cardiology

## 2023-10-31 ENCOUNTER — Other Ambulatory Visit (HOSPITAL_BASED_OUTPATIENT_CLINIC_OR_DEPARTMENT_OTHER): Payer: Self-pay

## 2023-10-31 DIAGNOSIS — I872 Venous insufficiency (chronic) (peripheral): Secondary | ICD-10-CM

## 2023-10-31 DIAGNOSIS — I10 Essential (primary) hypertension: Secondary | ICD-10-CM

## 2023-11-02 ENCOUNTER — Other Ambulatory Visit (HOSPITAL_BASED_OUTPATIENT_CLINIC_OR_DEPARTMENT_OTHER): Payer: Self-pay

## 2023-11-02 MED ORDER — POTASSIUM CHLORIDE CRYS ER 20 MEQ PO TBCR
40.0000 meq | EXTENDED_RELEASE_TABLET | Freq: Two times a day (BID) | ORAL | 3 refills | Status: AC
Start: 2023-11-02 — End: ?
  Filled 2023-11-02: qty 360, 90d supply, fill #0
  Filled 2024-01-28: qty 360, 90d supply, fill #1

## 2023-11-02 MED ORDER — SPIRONOLACTONE 25 MG PO TABS
25.0000 mg | ORAL_TABLET | Freq: Every day | ORAL | 2 refills | Status: AC
  Filled 2023-11-02: qty 90, 90d supply, fill #0
  Filled 2024-01-28: qty 90, 90d supply, fill #1

## 2023-11-03 ENCOUNTER — Other Ambulatory Visit (HOSPITAL_BASED_OUTPATIENT_CLINIC_OR_DEPARTMENT_OTHER): Payer: Self-pay

## 2024-03-20 ENCOUNTER — Other Ambulatory Visit (HOSPITAL_BASED_OUTPATIENT_CLINIC_OR_DEPARTMENT_OTHER): Payer: Self-pay | Admitting: Cardiology

## 2024-03-20 DIAGNOSIS — E669 Obesity, unspecified: Secondary | ICD-10-CM
